# Patient Record
Sex: Male | Born: 1975 | Race: White | Hispanic: Yes | Marital: Married | Smoking: Never smoker
Health system: Southern US, Community
[De-identification: ages and names within clinical notes are randomized; demographics above are authoritative.]

## PROBLEM LIST (undated history)

## (undated) DIAGNOSIS — J189 Pneumonia, unspecified organism: Secondary | ICD-10-CM

## (undated) DIAGNOSIS — U071 COVID-19: Secondary | ICD-10-CM

## (undated) DIAGNOSIS — Z87442 Personal history of urinary calculi: Secondary | ICD-10-CM

## (undated) DIAGNOSIS — R918 Other nonspecific abnormal finding of lung field: Secondary | ICD-10-CM

## (undated) DIAGNOSIS — R0600 Dyspnea, unspecified: Secondary | ICD-10-CM

## (undated) HISTORY — PX: WISDOM TOOTH EXTRACTION: SHX21

## (undated) HISTORY — PX: KIDNEY STONE SURGERY: SHX686

## (undated) HISTORY — PX: BRAIN SURGERY: SHX531

## (undated) HISTORY — PX: TONSILLECTOMY: SHX5217

---

## 2013-08-01 HISTORY — PX: BRAIN SURGERY: SHX531

## 2020-03-26 ENCOUNTER — Ambulatory Visit
Admission: RE | Admit: 2020-03-26 | Discharge: 2020-03-26 | Disposition: A | Discharge status: Home / Self Care | Payer: No Typology Code available for payment source | Source: Ambulatory Visit | Attending: Nurse Practitioner | Admitting: Nurse Practitioner

## 2020-03-26 ENCOUNTER — Other Ambulatory Visit: Payer: Self-pay

## 2020-03-26 ENCOUNTER — Other Ambulatory Visit: Payer: Self-pay | Admitting: Nurse Practitioner

## 2020-03-26 DIAGNOSIS — R0989 Other specified symptoms and signs involving the circulatory and respiratory systems: Secondary | ICD-10-CM

## 2020-03-26 IMAGING — CR DG CHEST 2V
2 series · 2 of 2 positions shown · non-contrast
Comparison: None.

CLINICAL DATA: Chest congestion

EXAM:
CHEST - 2 VIEW

[w chest pa]
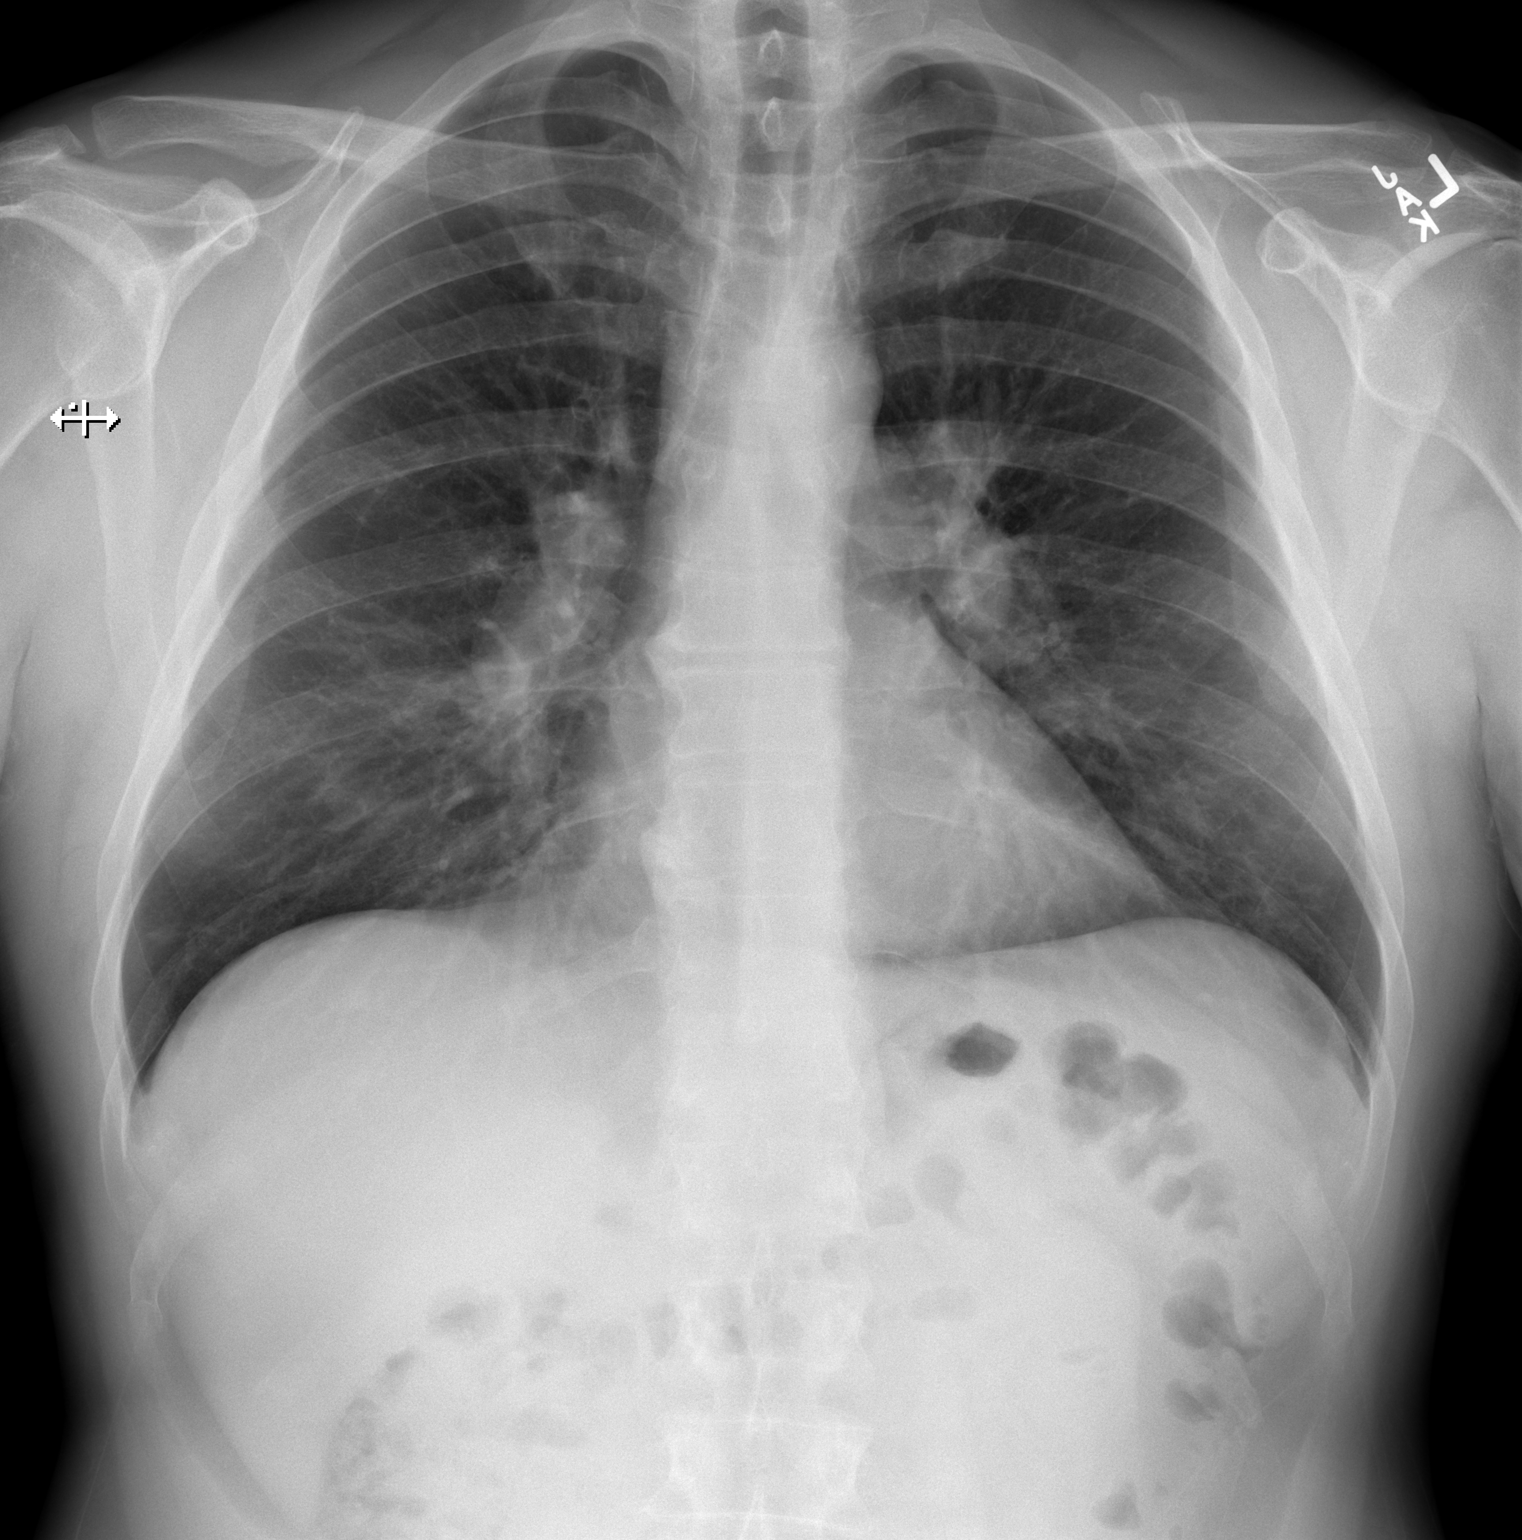

[w chest lat]
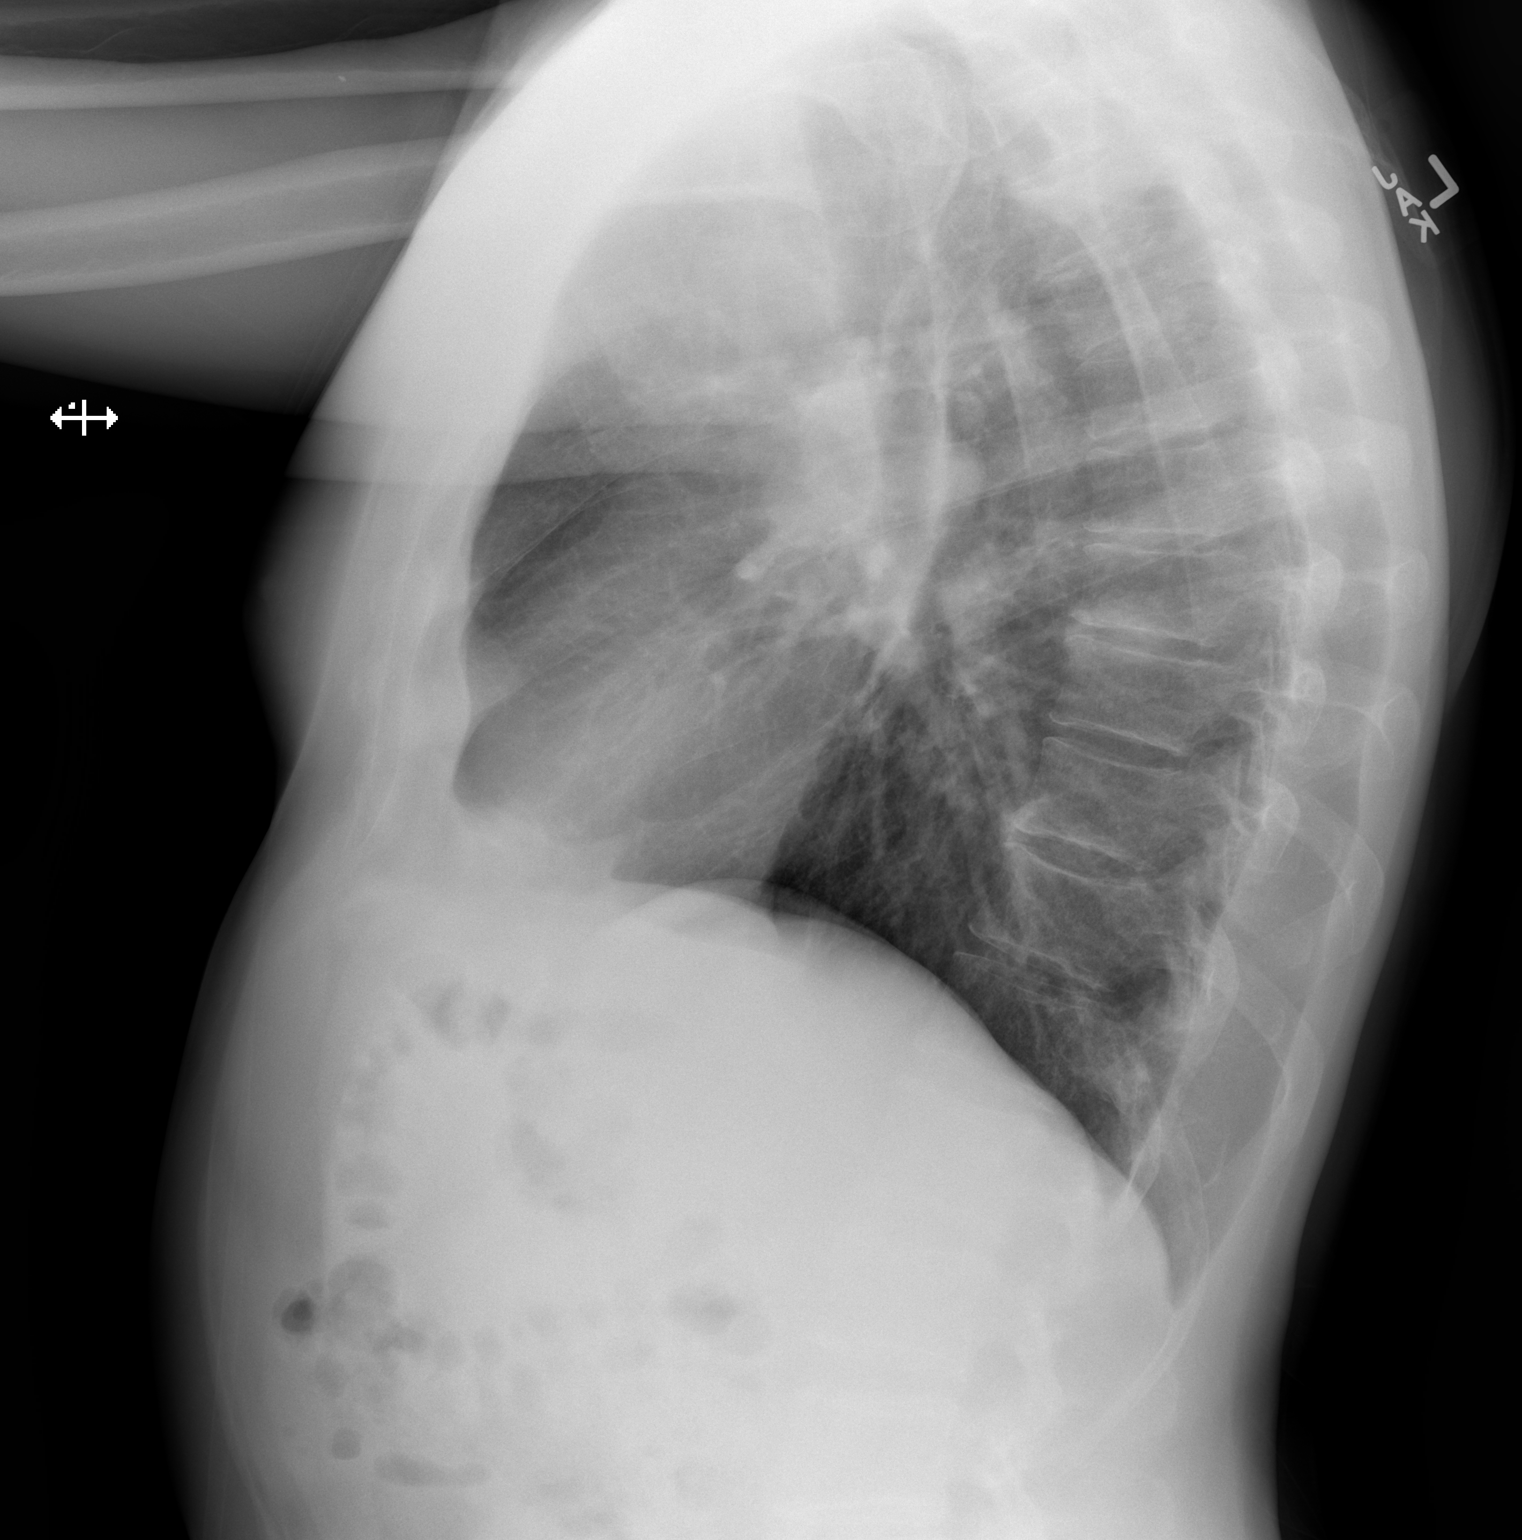

[2 of 2 positions shown; findings below may reference images not displayed]

FINDINGS: Cardiac shadow is within normal limits. The lungs are well aerated
bilaterally. Fullness is noted in the hila bilaterally suspicious
for underlying adenopathy. No acute bony abnormality is seen.
IMPRESSION: Prominent hila bilaterally likely related underlying adenopathy. CT
of the chest with contrast is recommended for further evaluation.

## 2020-03-30 ENCOUNTER — Other Ambulatory Visit: Payer: Self-pay | Admitting: Nurse Practitioner

## 2020-03-30 DIAGNOSIS — R591 Generalized enlarged lymph nodes: Secondary | ICD-10-CM

## 2020-03-30 DIAGNOSIS — R079 Chest pain, unspecified: Secondary | ICD-10-CM

## 2020-04-10 ENCOUNTER — Ambulatory Visit
Admission: RE | Admit: 2020-04-10 | Discharge: 2020-04-10 | Disposition: A | Discharge status: Home / Self Care | Payer: Self-pay | Source: Ambulatory Visit | Attending: Nurse Practitioner | Admitting: Nurse Practitioner

## 2020-04-10 ENCOUNTER — Other Ambulatory Visit: Payer: Self-pay

## 2020-04-10 DIAGNOSIS — R591 Generalized enlarged lymph nodes: Secondary | ICD-10-CM

## 2020-04-10 IMAGING — CT CT CHEST W/ CM
1 series · 15 of 34 positions shown, 19 images · IV contrast (APPLIED)
Comparison: Chest radiograph [DATE]

CLINICAL DATA: Follow-up abnormal chest x-ray

EXAM:
CT CHEST WITH CONTRAST
TECHNIQUE: Multidetector CT imaging of the chest was performed during
intravenous contrast administration.
CONTRAST:  75mL [TZ] IOPAMIDOL ([TZ]) INJECTION 61%

[Series 2: chest w/cm · axial · 0.83mm/px · z∈[-151,+125]mm · 15 of 162 slices shown, 19 images]
[im 12/162  mediastinal]
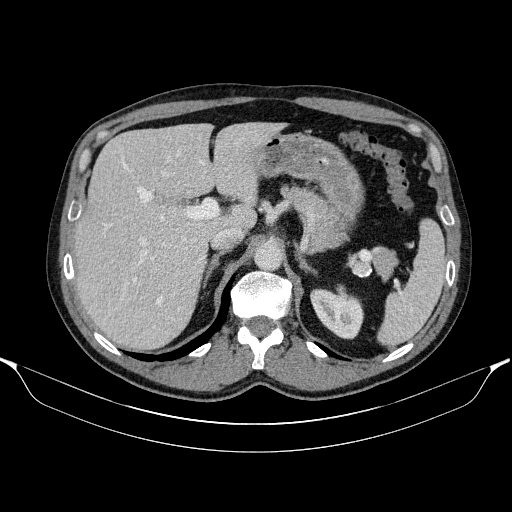
[im 12/162  lung]
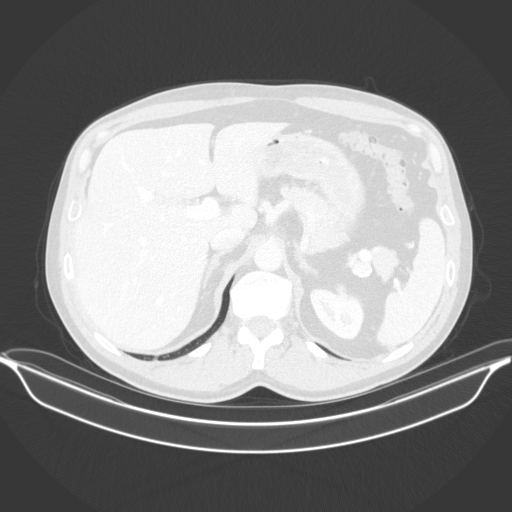
[im 24/162  lung]
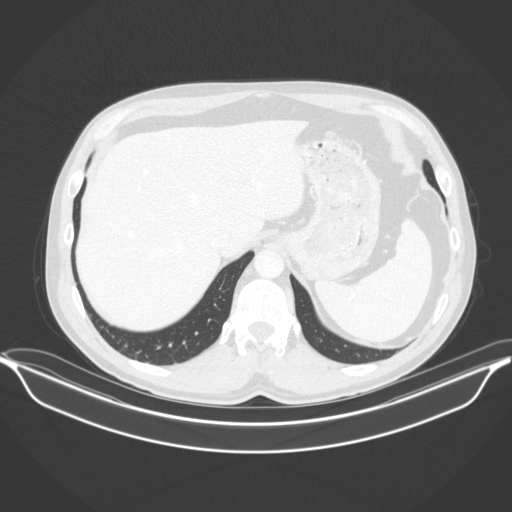
[im 33/162  lung]
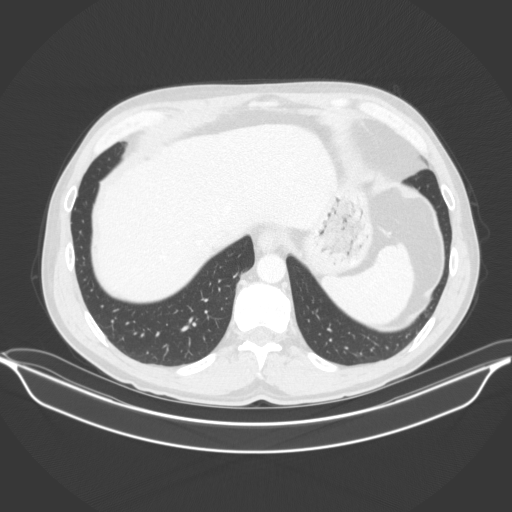
[im 42/162  lung]
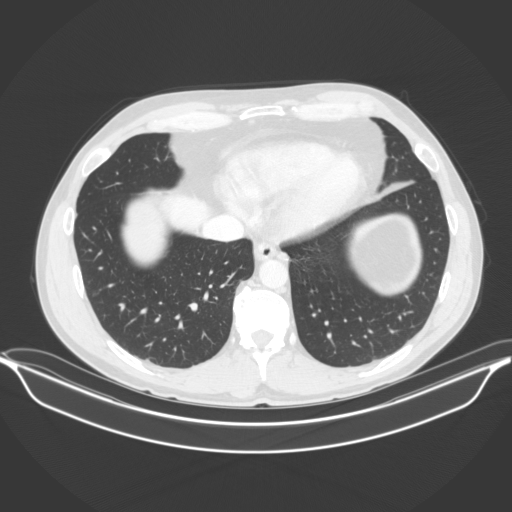
[im 54/162  mediastinal]
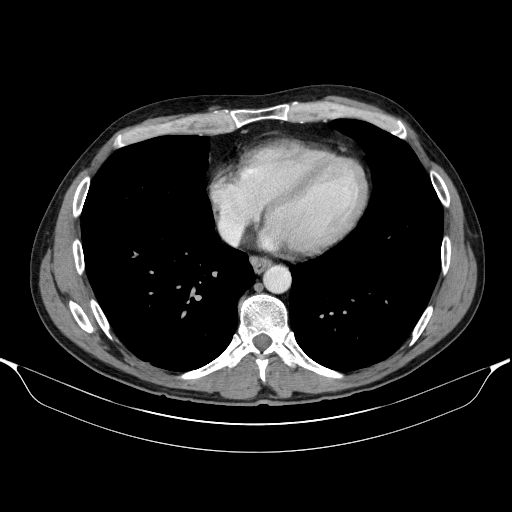
[im 54/162  lung]
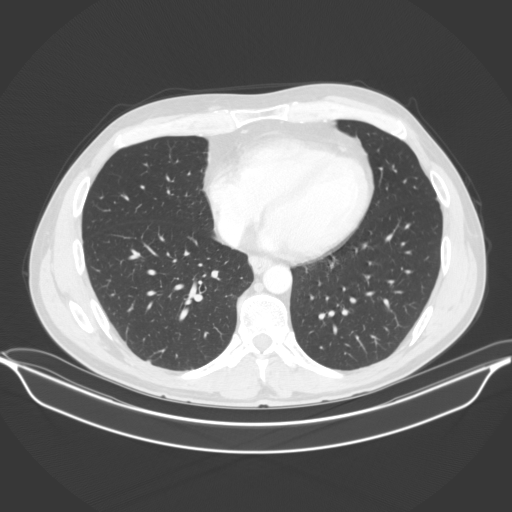
[im 65/162  lung]
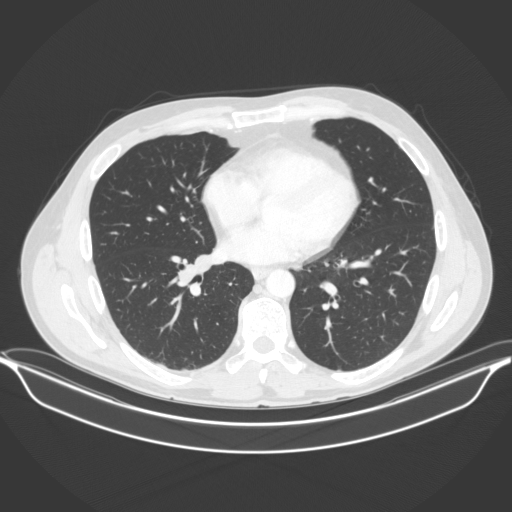
[im 72/162  lung]
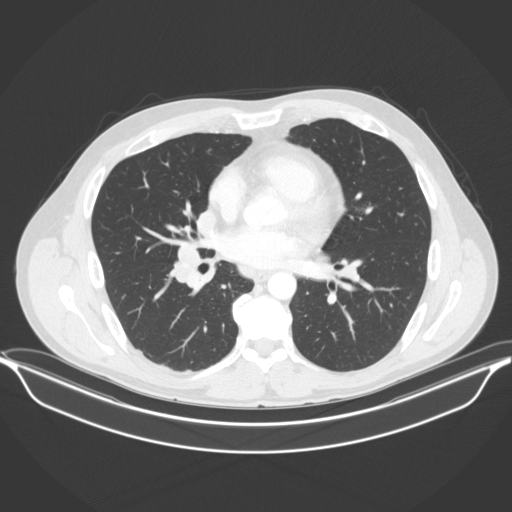
[im 84/162  lung]
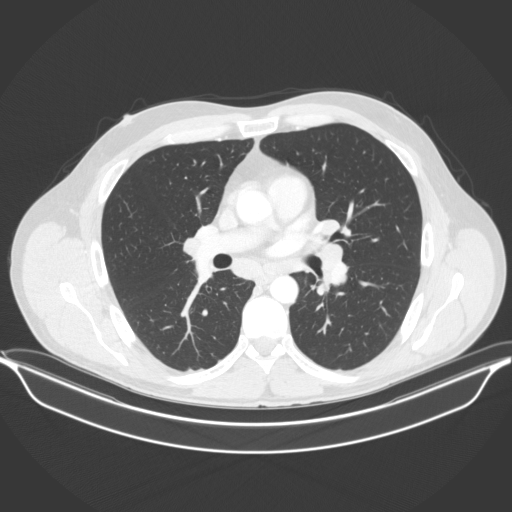
[im 90/162  mediastinal]
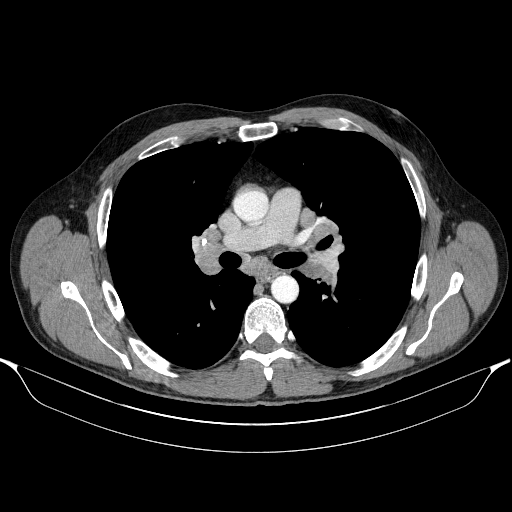
[im 90/162  lung]
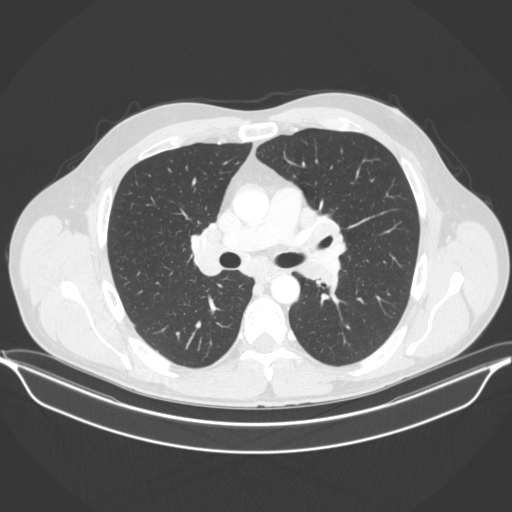
[im 97/162  lung]
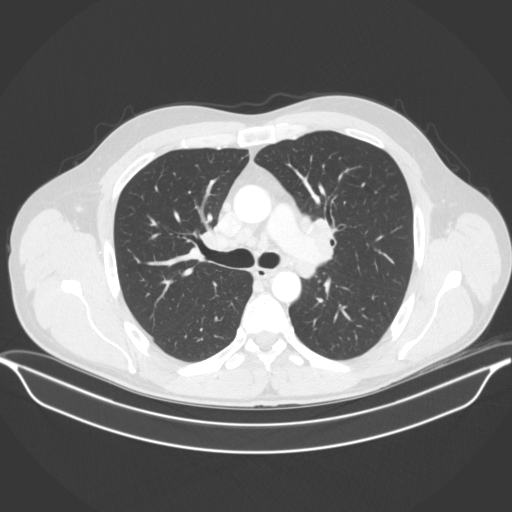
[im 108/162  lung]
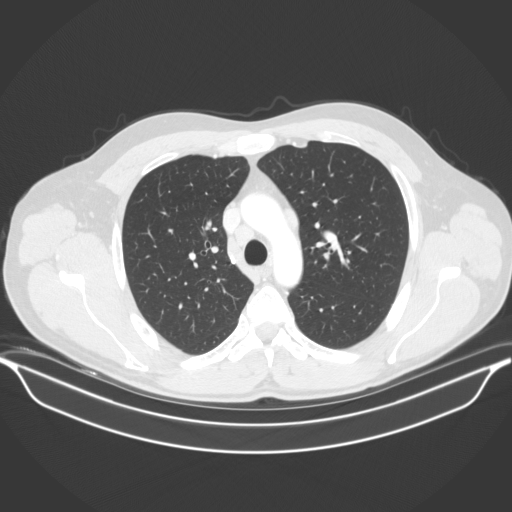
[im 120/162  lung]
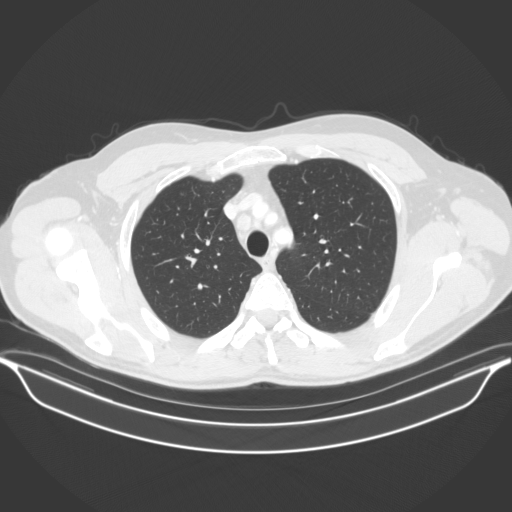
[im 129/162  mediastinal]
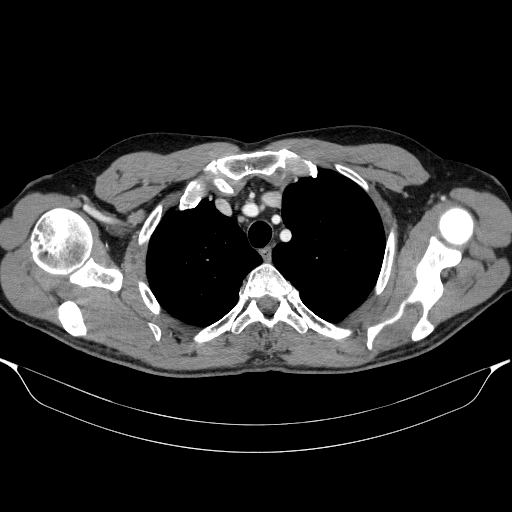
[im 129/162  lung]
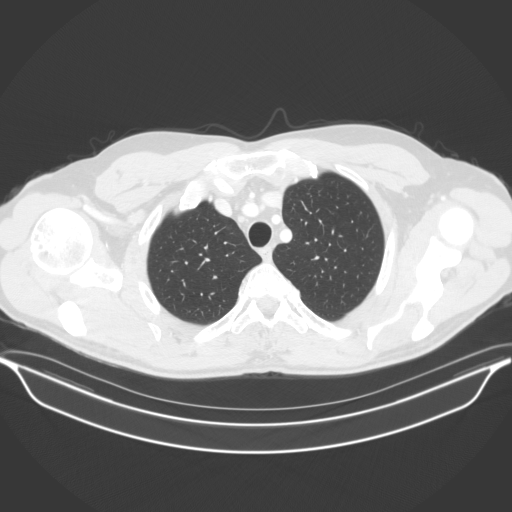
[im 138/162  lung]
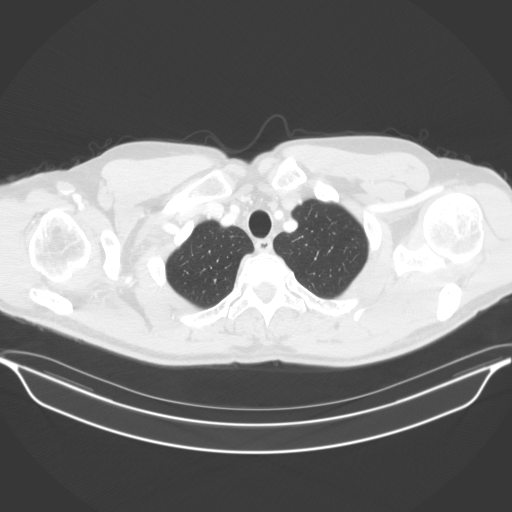
[im 150/162  lung]
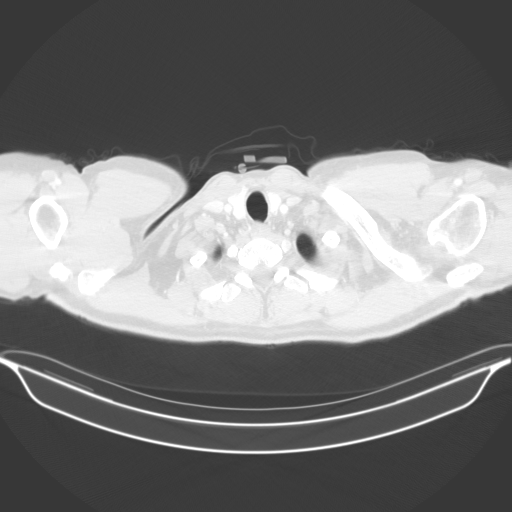

[15 of 34 positions shown; findings below may reference images not displayed]

FINDINGS: Cardiovascular: Normal heart size. No pericardial effusion. Thoracic
aorta is normal in caliber.

Mediastinum/Nodes: Mediastinal and hilar adenopathy is present. For
example, right subcarinal node measuring 1.6 x 2.4 cm (series 2,
image 78); right hilar node measuring 3.6 x 2.2 cm (image 85);
pretracheal node measuring 1.4 x 1.2 cm (image 46). Few small foci
of right hilar calcification. Thyroid is unremarkable. Esophagus is
unremarkable.

Lungs/Pleura: No consolidation or mass. There is a 3 mm nodule of
the left lower lobe (series 5, image 84). Right lower lobe nodule
measuring 4 mm (image 120)

Upper Abdomen: No acute abnormality.

Musculoskeletal: Focus of sclerosis within the L1 vertebral body
probably reflects a bone island. No acute osseous abnormality.
IMPRESSION: Mediastinal and bilateral hilar lymphadenopathy.

Right lower lobe 4 mm nodule. Left lower lobe 3 mm nodule. No
follow-up needed if patient is low-risk (and has no known or
suspected primary neoplasm). Non-contrast chest CT can be considered
in 12 months depending on the etiology of above.

## 2020-04-10 MED ORDER — IOPAMIDOL (ISOVUE-300) INJECTION 61%
75.0000 mL | Freq: Once | INTRAVENOUS | Status: AC | PRN
  Administered 2020-04-10: 75 mL via INTRAVENOUS

## 2020-04-21 ENCOUNTER — Encounter (HOSPITAL_COMMUNITY): Payer: Self-pay | Admitting: *Deleted

## 2020-04-21 ENCOUNTER — Emergency Department (HOSPITAL_COMMUNITY)
Admission: EM | Admit: 2020-04-21 | Discharge: 2020-04-22 | Discharge status: Against Medical Advice | Payer: Self-pay | Attending: Emergency Medicine | Admitting: Emergency Medicine

## 2020-04-21 ENCOUNTER — Other Ambulatory Visit: Payer: Self-pay

## 2020-04-21 DIAGNOSIS — R079 Chest pain, unspecified: Secondary | ICD-10-CM | POA: Insufficient documentation

## 2020-04-21 DIAGNOSIS — R21 Rash and other nonspecific skin eruption: Secondary | ICD-10-CM | POA: Insufficient documentation

## 2020-04-21 DIAGNOSIS — R000 Tachycardia, unspecified: Secondary | ICD-10-CM | POA: Insufficient documentation

## 2020-04-21 DIAGNOSIS — J069 Acute upper respiratory infection, unspecified: Secondary | ICD-10-CM | POA: Insufficient documentation

## 2020-04-21 DIAGNOSIS — R102 Pelvic and perineal pain: Secondary | ICD-10-CM | POA: Insufficient documentation

## 2020-04-21 DIAGNOSIS — R509 Fever, unspecified: Secondary | ICD-10-CM

## 2020-04-21 DIAGNOSIS — R911 Solitary pulmonary nodule: Secondary | ICD-10-CM | POA: Insufficient documentation

## 2020-04-21 DIAGNOSIS — R519 Headache, unspecified: Secondary | ICD-10-CM | POA: Insufficient documentation

## 2020-04-21 DIAGNOSIS — R590 Localized enlarged lymph nodes: Secondary | ICD-10-CM | POA: Insufficient documentation

## 2020-04-21 HISTORY — DX: COVID-19: U07.1

## 2020-04-21 LAB — CBC WITH DIFFERENTIAL/PLATELET
Abs Immature Granulocytes: 0.04 10*3/uL (ref 0.00–0.07)
Basophils Absolute: 0.1 10*3/uL (ref 0.0–0.1)
Basophils Relative: 1 %
Eosinophils Absolute: 0.1 10*3/uL (ref 0.0–0.5)
Eosinophils Relative: 2 %
HCT: 44.2 % (ref 39.0–52.0)
Hemoglobin: 14.3 g/dL (ref 13.0–17.0)
Immature Granulocytes: 1 %
Lymphocytes Relative: 9 %
Lymphs Abs: 0.7 10*3/uL (ref 0.7–4.0)
MCH: 30 pg (ref 26.0–34.0)
MCHC: 32.4 g/dL (ref 30.0–36.0)
MCV: 92.7 fL (ref 80.0–100.0)
Monocytes Absolute: 0.8 10*3/uL (ref 0.1–1.0)
Monocytes Relative: 10 %
Neutro Abs: 6.2 10*3/uL (ref 1.7–7.7)
Neutrophils Relative %: 77 %
Platelets: 196 10*3/uL (ref 150–400)
RBC: 4.77 MIL/uL (ref 4.22–5.81)
RDW: 12.3 % (ref 11.5–15.5)
WBC: 8 10*3/uL (ref 4.0–10.5)
nRBC: 0 % (ref 0.0–0.2)

## 2020-04-21 LAB — COMPREHENSIVE METABOLIC PANEL
ALT: 27 U/L (ref 0–44)
AST: 17 U/L (ref 15–41)
Albumin: 3.7 g/dL (ref 3.5–5.0)
Alkaline Phosphatase: 36 U/L — ABNORMAL LOW (ref 38–126)
Anion gap: 10 (ref 5–15)
BUN: 11 mg/dL (ref 6–20)
CO2: 25 mmol/L (ref 22–32)
Calcium: 9 mg/dL (ref 8.9–10.3)
Chloride: 99 mmol/L (ref 98–111)
Creatinine, Ser: 0.83 mg/dL (ref 0.61–1.24)
GFR calc Af Amer: >60 mL/min (ref >60)
GFR calc non Af Amer: >60 mL/min (ref >60)
Glucose, Bld: 109 mg/dL — ABNORMAL HIGH (ref 70–99)
Potassium: 4.1 mmol/L (ref 3.5–5.1)
Sodium: 134 mmol/L — ABNORMAL LOW (ref 135–145)
Total Bilirubin: 0.9 mg/dL (ref 0.3–1.2)
Total Protein: 6.8 g/dL (ref 6.5–8.1)

## 2020-04-21 LAB — LACTIC ACID, PLASMA: Lactic Acid, Venous: 1.4 mmol/L (ref 0.5–1.9)

## 2020-04-21 NOTE — ED Notes (Signed)
Pt stated they are waiting outside

## 2020-04-21 NOTE — ED Notes (Signed)
Pt still here

## 2020-04-21 NOTE — ED Triage Notes (Addendum)
Pt was referred here from MD office for fever 25 days and had positive covid test. Developed sob and continues to have fever. Referred to hospital due to abnormal chest CT done on 04/10/20. Pt reports headaches too. Used interpreter line

## 2020-04-21 NOTE — ED Notes (Signed)
Pt name called for updated vitals, no response 

## 2020-04-22 ENCOUNTER — Emergency Department (HOSPITAL_COMMUNITY): Payer: Self-pay

## 2020-04-22 IMAGING — DX DG CHEST 1V PORT
1 series · 1 of 1 positions shown · non-contrast
Comparison: [DATE], CT [DATE]

CLINICAL DATA: Fever, dyspnea

EXAM:
PORTABLE CHEST 1 VIEW

[chest ap]
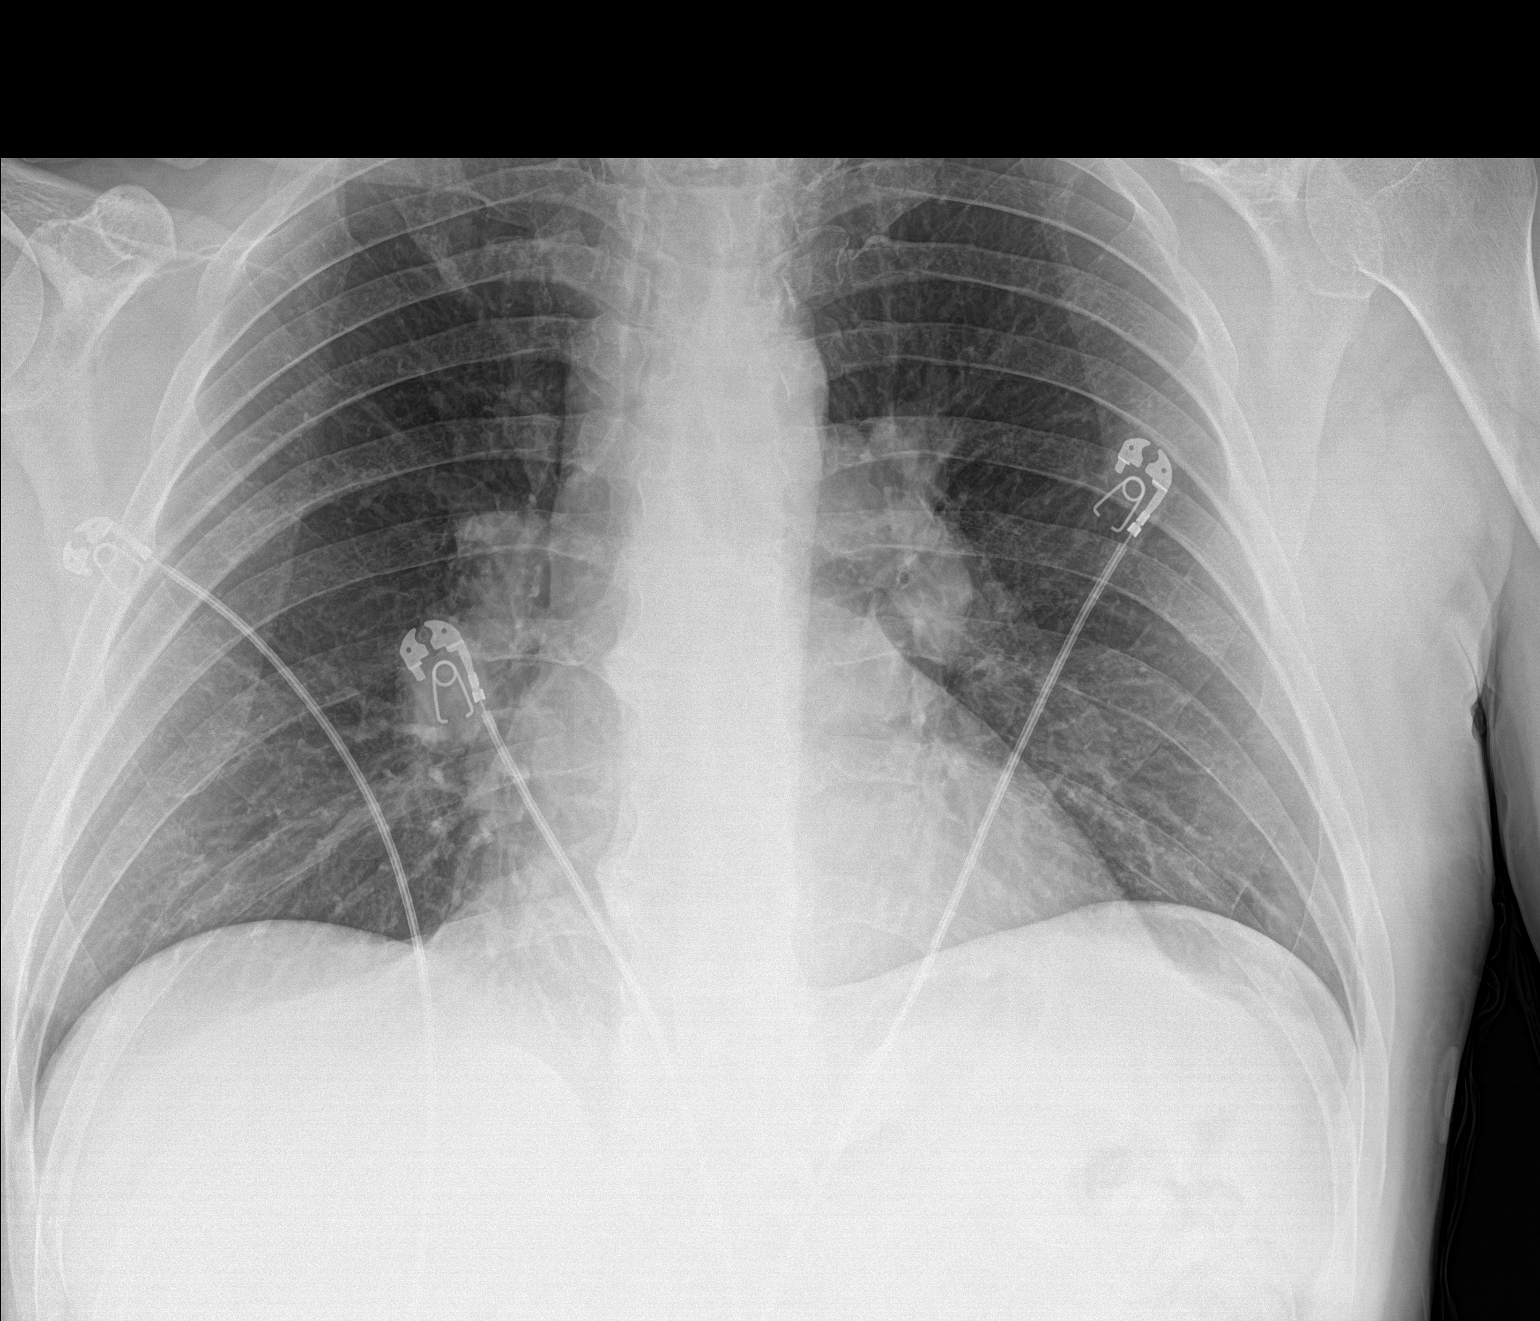

[1 of 1 positions shown; findings below may reference images not displayed]

FINDINGS: Lungs are clear. No pneumothorax or pleural effusion. Cardiac size
is within normal limits. Bilateral hilar fullness related to
underlying hilar adenopathy is again seen and is unchanged.
Pulmonary vascularity is normal. No acute bone abnormality.
IMPRESSION: Stable hilar adenopathy. No radiographic evidence of acute
cardiopulmonary disease.

## 2020-04-22 MED ORDER — PROMETHAZINE-DM 6.25-15 MG/5ML PO SYRP: 5.0000 mL | ORAL_SOLUTION | Freq: Four times a day (QID) | ORAL | 0 refills | Status: DC | PRN

## 2020-04-22 NOTE — ED Provider Notes (Signed)
[facility] EMERGENCY DEPARTMENT Provider Note   CSN: [id number] Arrival date & time: 04/21/20  1226     History Chief Complaint  Patient presents with  . Fever    Aaron Chapman is a 44 y.o. male who presents to the emergency department with a chief complaint of fever.  The patient has been having intermittent fevers for the last 37 days.  Reports that he was diagnosed with COVID-19 on August 14 and was persistently febrile for approximately 15 days.  At that time, he was also having nonproductive cough and shortness of breath that resolved when he defervesced.  He was then afebrile until around 8 days ago when his fevers returned accompanied by severe headaches, shortness of breath, nonproductive cough, and mild, dull, intermittent chest pain.  T-max 103.1-104 (39.5- 40 C) over the last week.  He has been taking antipyretics every 6 hours.  Last dose of 800 mg of ibuprofen was at 20:00.  He presented to the ER today after he developed a diffuse rash to his trunk and upper and lower extremities.  The rash is not pruritic.  He initially noticed the rash on his bilateral flanks that then spread across his abdomen, chest, back, arms, and legs.  No known sick contacts with a similar rash.  He has had a 4 kg weight loss since he was diagnosed with COVID-19, but attributes this to poor appetite.  Aside from when he has been febrile, he has had no other night sweats.  No hemoptysis, vomiting, neck pain or stiffness, confusion, otalgia, or sore throat.  He also reports that he was previously told that he had inflammation of the colon.  He does not ever recall being diagnosed with diverticulitis.  He does report that he has had some inflammation in his left pelvic region recently.  Denies diarrhea, constipation, arthralgias, melena, hematochezia, dysuria, hematuria, flank pain, penile or testicular pain or swelling.  He has been followed by his PCP for his symptoms.  Reports that he  was prescribed a course of amoxicillin after he developed a pneumonia after he was diagnosed with COVID-19.  He completed the course of antibiotics yesterday.  Reports that he also received approximately 6 "steroid shots" to help with inflammation since his diagnosis.  Given persistent fevers, his PCP ordered a CT scan of the chest on 9/10, which demonstrated right lower lobe 4 mm nodule, left lower lobe 3 mm nodule and mediastinal and bilateral hilar lymphadenopathy.  There is also a focus of sclerosis on the L1 vertebral body it was likely reflecting a bone island.  He moved to the [place] from [place] in January 2021.  Reports that he received all of his childhood immunizations.  He was also fully vaccinated against COVID-19 after receiving the [employer] vaccine approximately 4 months ago.  He has a history of Zika, dengue, and [place].   The history is provided by the patient and medical records. A language interpreter was used ([place]).       Past Medical History:  Diagnosis Date  . COVID-19     There are no problems to display for this patient.   History reviewed. No pertinent surgical history.     No family history on file.  Social History   Tobacco Use  . Smoking status: Never Smoker  . Smokeless tobacco: Never Used  Substance Use Topics  . Alcohol use: Not Currently  . Drug use: Not Currently    Home Medications Prior to Admission  medications   Medication Sig Start Date End Date Taking? Authorizing Provider  promethazine-dextromethorphan (PROMETHAZINE-DM) 6.25-15 MG/5ML syrup Take 5 mLs by mouth 4 (four) times daily as needed for cough. 04/22/20   Carter Kassel A, PA-C    Allergies    Aspirin  Review of Systems   Review of Systems  Constitutional: Positive for chills, diaphoresis and fever. Negative for appetite change.  HENT: Negative for congestion and sore throat.   Eyes: Negative for visual disturbance.  Respiratory: Positive for cough  and shortness of breath. Negative for wheezing.   Cardiovascular: Positive for chest pain. Negative for palpitations and leg swelling.  Gastrointestinal: Positive for abdominal pain. Negative for blood in stool, constipation, diarrhea, nausea and vomiting.  Genitourinary: Negative for dysuria, flank pain, hematuria, penile pain, penile swelling, scrotal swelling and urgency.  Musculoskeletal: Negative for arthralgias, back pain, joint swelling, neck pain and neck stiffness.  Skin: Negative for rash.  Allergic/Immunologic: Negative for immunocompromised state.  Neurological: Positive for headaches. Negative for dizziness, seizures, syncope, weakness and numbness.  Psychiatric/Behavioral: Negative for confusion.    Physical Exam Updated Vital Signs BP (!) 130/92 (BP Location: Right Arm)   Pulse 90   Temp 99 F (37.2 C) (Oral)   Resp 19   Wt 79.8 kg   SpO2 100%   Physical Exam Vitals and nursing note reviewed.  Constitutional:      General: He is not in acute distress.    Appearance: He is well-developed. He is not ill-appearing, toxic-appearing or diaphoretic.     Comments: Nontoxic.  No acute distress.  HENT:     Head: Normocephalic.     Mouth/Throat:     Mouth: Mucous membranes are moist.     Comments: No intraoral lesions Eyes:     General: No scleral icterus.    Extraocular Movements: Extraocular movements intact.     Conjunctiva/sclera: Conjunctivae normal.     Pupils: Pupils are equal, round, and reactive to light.  Neck:     Comments: No meningismus Cardiovascular:     Rate and Rhythm: Regular rhythm. Tachycardia present.     Heart sounds: No murmur heard.   Pulmonary:     Effort: Pulmonary effort is normal. No respiratory distress.     Breath sounds: No wheezing or rales.  Abdominal:     General: There is no distension.     Palpations: Abdomen is soft. There is no mass.     Tenderness: There is abdominal tenderness. There is no right CVA tenderness, left CVA  tenderness, guarding or rebound.     Hernia: No hernia is present.     Comments: Mild tenderness palpation of the left pelvic region.  No palpable inguinal lymphadenopathy bilaterally.  Abdomen is soft and nondistended.  Musculoskeletal:     Cervical back: Normal range of motion and neck supple.     Right lower leg: No edema.     Left lower leg: No edema.  Skin:    General: Skin is warm and dry.     Comments: Diffuse macular papular rash noted to the trunk, arms, and legs.  The face, and palms and soles are spared.  Neurological:     Mental Status: He is alert.  Psychiatric:        Behavior: Behavior normal.     ED Results / Procedures / Treatments   Labs (all labs ordered are listed, but only abnormal results are displayed) Labs Reviewed  COMPREHENSIVE METABOLIC PANEL - Abnormal; Notable for the following components:  Result Value   Sodium 134 (*)    Glucose, Bld 109 (*)    Alkaline Phosphatase 36 (*)    All other components within normal limits  CULTURE, BLOOD (ROUTINE X 2)  CULTURE, BLOOD (ROUTINE X 2)  LACTIC ACID, PLASMA  CBC WITH DIFFERENTIAL/PLATELET  URINALYSIS, ROUTINE W REFLEX MICROSCOPIC  QUANTIFERON-TB GOLD PLUS  PLASMODIUM SP. PCR    EKG EKG Interpretation  Date/Time:  Tuesday April 21 2020 13:57:00 EDT Ventricular Rate:  105 PR Interval:  148 QRS Duration: 80 QT Interval:  308 QTC Calculation: 407 R Axis:   98 Text Interpretation: Sinus tachycardia Rightward axis Borderline ECG No old tracing to compare Confirmed by Dione Booze ([id number]) on 04/21/2020 11:45:56 PM   Radiology DG Chest Portable 1 View  Result Date: 04/22/2020 CLINICAL DATA:  Fever, dyspnea EXAM: PORTABLE CHEST 1 VIEW COMPARISON:  03/26/2020, CT 04/10/2020 FINDINGS: Lungs are clear. No pneumothorax or pleural effusion. Cardiac size is within normal limits. Bilateral hilar fullness related to underlying hilar adenopathy is again seen and is unchanged. Pulmonary vascularity is  normal. No acute bone abnormality. IMPRESSION: Stable hilar adenopathy. No radiographic evidence of acute cardiopulmonary disease. Electronically Signed   By: Helyn Numbers MD   On: 04/22/2020 00:21    Procedures Procedures (including critical care time)  Medications Ordered in ED Medications - No data to display  ED Course  I have reviewed the triage vital signs and the nursing notes.  Pertinent labs & imaging results that were available during my care of the patient were reviewed by me and considered in my medical decision making (see chart for details).    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 vaccine presenting with intermittent fevers for the last 37 days.  Fevers initially began when he was diagnosed with COVID-19 and resolved after 15 days, but returned approximately 8 days ago accompanied by shortness of breath, cough, dull chest pain, headache.  Most recently, he developed a diffuse rash to the trunk and extremities around 3 days ago.  Patient's previous imaging from September 10 reviewed with CT chest demonstrating bilateral pulmonary nodules and lower lobes as well as mediastinal and bilateral hilar adenopathy.  Repeat chest x-ray has been reviewed by me demonstrating stable hilar adenopathy.  Chest x-ray is otherwise unremarkable.  Vital signs have been stable and reassuring since arriving in the ER.  Afebrile, but notes that his last dose of ibuprofen was at 2000.  The patient was seen and independently evaluated by Dr. Preston Fleeting, attending physician.   CBC is reassuring with no leukocytosis.  No metabolic derangements.  Lactate is normal.  Given concern for fever of unknown origin given that the patient lived in [place] until January 2021, will order QuantiFERON gold, blood cultures x2, and Plasmodium  PCR.  Could also consider that symptoms could be secondary to long Covid; however, I am uncertain about the recurrence of his fever over the last week since he had previously defervesced.  He does not appear to have a bacterial process at this time requiring antibiotics.  Will refer the patient to outpatient infectious disease clinic and send message to on-call infectious disease physician via epic.  We will also ensure that patient has a referral to pulmonology.  He is already established with primary care.  At this time, patient is hemodynamically stable and in no acute distress.  The patient's work-up and plan were discussed using a certified Spanish medical interpreter.  All questions were answered.  ER return precautions given.  Will discharge home with symptomatic care for cough and fever.  Safe for discharge to home with outpatient follow-up as indicated.  Final Clinical Impression(s) / ED Diagnoses Final diagnoses:  Fever of unknown origin  Upper respiratory tract infection, unspecified type  Adenopathy, hilar    Rx / DC Orders ED Discharge Orders         Ordered    promethazine-dextromethorphan (PROMETHAZINE-DM) 6.25-15 MG/5ML syrup  4 times daily PRN        04/22/20 0222           Keithon Mccoin A, PA-C 04/22/20 0747    Dione Booze, MD 04/22/20 2240

## 2020-04-22 NOTE — Discharge Instructions (Addendum)
Thank you for allowing me to care for you today in the Emergency Department.   I have given you a referral to an infectious disease clinic as well as a pulmonology clinic (lung specialist).  Please call both offices to schedule a follow-up appointment.  I have sent a message to the infectious disease clinic to let them know that you will be calling their office since you have been having intermittent fevers for more than a month.  Although you have not been feeling well, you were blood work and imaging in the ER today were reassuring enough to send you home to have a follow-up in the clinic.  Take 650 mg of Tylenol or 600 mg of ibuprofen with food every 6 hours for fever.  You can alternate between these 2 medications every 3 hours if your pain returns.  For instance, you can take Tylenol at noon, followed by a dose of ibuprofen at 3, followed by second dose of Tylenol and 6.  Take 5 mL of Promethazine DM every 6 hours as needed for cough.  If your symptoms were to significantly change and you developed respiratory distress, significant trouble breathing, if you passed out, develop uncontrollable vomiting, or other new, concerning symptoms, you should return to the emergency department for reevaluation.  Gracias por permitirme atenderlo [employer].  Conley Rolls he remitido a Agricultural engineer de enfermedades infecciosas, as como a Agricultural engineer de neumologa (especialista en pulmones). Llame a ambas oficinas para programar una cita de seguimiento. He enviado un mensaje a la clnica de enfermedades infecciosas para informarles que llamar a su oficina ya que ha tenido fiebres intermitentes durante ms de Materials engineer.  Aunque no se ha sentido [address], los [employer] de sangre y las imgenes en la sala de emergencias hoy fueron lo suficientemente tranquilizadores como para enviarlo a casa para un seguimiento en la clnica.  Tome 650 mg de Tylenol o 600 mg de ibuprofeno con alimentos cada 6 horas para  la fiebre. Puede alternar entre estos 2 medicamentos cada 3 horas si su dolor regresa. Por ejemplo, puede tomar Tylenol al medioda, seguido de una dosis de ibuprofeno a las 3, seguida de Neomia Dear segunda dosis de Tylenol y 6.  Tome 5 ml de Promethazine DM cada 6 horas segn sea necesario para la tos.  Si sus sntomas cambiaran significativamente y desarroll dificultad respiratoria, dificultad significativa para respirar, si se desmay, desarroll vmitos incontrolables u otros sntomas nuevos relacionados, debe regresar al departamento de emergencias para una reevaluacin.

## 2020-04-23 ENCOUNTER — Telehealth: Payer: Self-pay | Admitting: Pulmonary Disease

## 2020-04-23 NOTE — Telephone Encounter (Signed)
Spoke with the pt's friend Elita Quick  He states pt tested pos for covid 4 wks ago  He has been having fever ever since that will not go away  Appt scheduled with Dr. Isaiah Serge for 05/06/20  I checked and there are no sooner appts with any provider  Advised if having acute symptoms and needs to be seen sooner go to UC  He verbalized understanding

## 2020-04-24 LAB — QUANTIFERON-TB GOLD PLUS

## 2020-04-24 LAB — PLASMODIUM SP. PCR: Plasmodium Sp. PCR: NEGATIVE

## 2020-04-25 ENCOUNTER — Emergency Department (HOSPITAL_COMMUNITY): Payer: Self-pay

## 2020-04-25 ENCOUNTER — Other Ambulatory Visit: Payer: Self-pay

## 2020-04-25 ENCOUNTER — Encounter (HOSPITAL_COMMUNITY): Payer: Self-pay | Admitting: Emergency Medicine

## 2020-04-25 ENCOUNTER — Inpatient Hospital Stay (HOSPITAL_COMMUNITY)
Admission: EM | Admit: 2020-04-25 | Discharge: 2020-05-05 | DRG: 196 | Disposition: A | Discharge status: Home / Self Care | Payer: Self-pay | Attending: Internal Medicine | Admitting: Internal Medicine

## 2020-04-25 DIAGNOSIS — G039 Meningitis, unspecified: Secondary | ICD-10-CM | POA: Diagnosis present

## 2020-04-25 DIAGNOSIS — I776 Arteritis, unspecified: Secondary | ICD-10-CM | POA: Diagnosis present

## 2020-04-25 DIAGNOSIS — G0490 Encephalitis and encephalomyelitis, unspecified: Secondary | ICD-10-CM | POA: Diagnosis present

## 2020-04-25 DIAGNOSIS — R519 Headache, unspecified: Secondary | ICD-10-CM | POA: Diagnosis present

## 2020-04-25 DIAGNOSIS — R479 Unspecified speech disturbances: Secondary | ICD-10-CM

## 2020-04-25 DIAGNOSIS — D8689 Sarcoidosis of other sites: Principal | ICD-10-CM | POA: Diagnosis present

## 2020-04-25 DIAGNOSIS — Z79899 Other long term (current) drug therapy: Secondary | ICD-10-CM

## 2020-04-25 DIAGNOSIS — R599 Enlarged lymph nodes, unspecified: Secondary | ICD-10-CM

## 2020-04-25 DIAGNOSIS — I728 Aneurysm of other specified arteries: Secondary | ICD-10-CM | POA: Diagnosis present

## 2020-04-25 DIAGNOSIS — R29818 Other symptoms and signs involving the nervous system: Secondary | ICD-10-CM

## 2020-04-25 DIAGNOSIS — Z7952 Long term (current) use of systemic steroids: Secondary | ICD-10-CM

## 2020-04-25 DIAGNOSIS — Z8619 Personal history of other infectious and parasitic diseases: Secondary | ICD-10-CM

## 2020-04-25 DIAGNOSIS — Z8616 Personal history of COVID-19: Secondary | ICD-10-CM

## 2020-04-25 DIAGNOSIS — I729 Aneurysm of unspecified site: Secondary | ICD-10-CM

## 2020-04-25 DIAGNOSIS — R509 Fever, unspecified: Secondary | ICD-10-CM | POA: Diagnosis present

## 2020-04-25 DIAGNOSIS — Z87442 Personal history of urinary calculi: Secondary | ICD-10-CM

## 2020-04-25 HISTORY — DX: Pneumonia, unspecified organism: J18.9

## 2020-04-25 HISTORY — DX: Personal history of urinary calculi: Z87.442

## 2020-04-25 HISTORY — DX: Dyspnea, unspecified: R06.00

## 2020-04-25 HISTORY — DX: Other nonspecific abnormal finding of lung field: R91.8

## 2020-04-25 LAB — CSF CELL COUNT WITH DIFFERENTIAL
Eosinophils, CSF: 0 % (ref 0–1)
Lymphs, CSF: 93 % — ABNORMAL HIGH (ref 40–80)
Monocyte-Macrophage-Spinal Fluid: 7 % — ABNORMAL LOW (ref 15–45)
RBC Count, CSF: 349 /mm3 — ABNORMAL HIGH
Segmented Neutrophils-CSF: 0 % (ref 0–6)
Tube #: 3
WBC, CSF: 106 /mm3 (ref 0–5)

## 2020-04-25 LAB — COMPREHENSIVE METABOLIC PANEL
ALT: 28 U/L (ref 0–44)
AST: 18 U/L (ref 15–41)
Albumin: 3.8 g/dL (ref 3.5–5.0)
Alkaline Phosphatase: 35 U/L — ABNORMAL LOW (ref 38–126)
Anion gap: 8 (ref 5–15)
BUN: 6 mg/dL (ref 6–20)
CO2: 26 mmol/L (ref 22–32)
Calcium: 9.4 mg/dL (ref 8.9–10.3)
Chloride: 103 mmol/L (ref 98–111)
Creatinine, Ser: 0.78 mg/dL (ref 0.61–1.24)
GFR calc Af Amer: >60 mL/min (ref >60)
GFR calc non Af Amer: >60 mL/min (ref >60)
Glucose, Bld: 104 mg/dL — ABNORMAL HIGH (ref 70–99)
Potassium: 4.7 mmol/L (ref 3.5–5.1)
Sodium: 137 mmol/L (ref 135–145)
Total Bilirubin: 0.9 mg/dL (ref 0.3–1.2)
Total Protein: 7 g/dL (ref 6.5–8.1)

## 2020-04-25 LAB — LACTIC ACID, PLASMA
Lactic Acid, Venous: 1.4 mmol/L (ref 0.5–1.9)
Lactic Acid, Venous: 1.5 mmol/L (ref 0.5–1.9)

## 2020-04-25 LAB — CBC WITH DIFFERENTIAL/PLATELET
Abs Immature Granulocytes: 0.02 10*3/uL (ref 0.00–0.07)
Basophils Absolute: 0 10*3/uL (ref 0.0–0.1)
Basophils Relative: 1 %
Eosinophils Absolute: 0.1 10*3/uL (ref 0.0–0.5)
Eosinophils Relative: 3 %
HCT: 44.8 % (ref 39.0–52.0)
Hemoglobin: 15.1 g/dL (ref 13.0–17.0)
Immature Granulocytes: 0 %
Lymphocytes Relative: 17 %
Lymphs Abs: 0.8 10*3/uL (ref 0.7–4.0)
MCH: 31.4 pg (ref 26.0–34.0)
MCHC: 33.7 g/dL (ref 30.0–36.0)
MCV: 93.1 fL (ref 80.0–100.0)
Monocytes Absolute: 0.6 10*3/uL (ref 0.1–1.0)
Monocytes Relative: 13 %
Neutro Abs: 2.9 10*3/uL (ref 1.7–7.7)
Neutrophils Relative %: 66 %
Platelets: 200 10*3/uL (ref 150–400)
RBC: 4.81 MIL/uL (ref 4.22–5.81)
RDW: 12.4 % (ref 11.5–15.5)
WBC: 4.5 10*3/uL (ref 4.0–10.5)
nRBC: 0 % (ref 0.0–0.2)

## 2020-04-25 LAB — URINALYSIS, ROUTINE W REFLEX MICROSCOPIC
Bilirubin Urine: NEGATIVE
Glucose, UA: NEGATIVE mg/dL
Hgb urine dipstick: NEGATIVE
Ketones, ur: NEGATIVE mg/dL
Leukocytes,Ua: NEGATIVE
Nitrite: NEGATIVE
Protein, ur: NEGATIVE mg/dL
Specific Gravity, Urine: 1.006 (ref 1.005–1.030)
pH: 5 (ref 5.0–8.0)

## 2020-04-25 LAB — PROTEIN AND GLUCOSE, CSF
Glucose, CSF: 23 mg/dL — CL (ref 40–70)
Total  Protein, CSF: 357 mg/dL — ABNORMAL HIGH (ref 15–45)

## 2020-04-25 IMAGING — MR MR CERVICAL SPINE WO/W CM
6 of 8 series · 28 of 48 positions shown · IV contrast (gadavist)
Comparison: Prior brain MRI from [DATE].
COMPARISON: Prior brain MRI from [DATE].

Addendum:
CLINICAL DATA: Initial evaluation for demyelinating disease.

EXAM:
MRI CERVICAL SPINE WITHOUT AND WITH CONTRAST
TECHNIQUE: Multiplanar and multiecho pulse sequences of the cervical spine, to
include the craniocervical junction and cervicothoracic junction,
were obtained without and with intravenous contrast.
CONTRAST:  7.5mL GADAVIST GADOBUTROL 1 MMOL/ML IV SOLN

[Series 3: T2 · sagittal · 3.0mm · 0.69mm/px · 4 of 15 slices shown (1 of 2)]
[im 1/15]
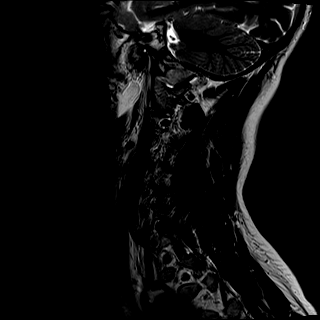
[im 5/15]
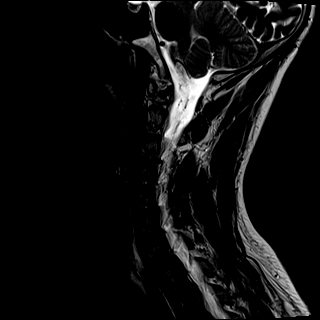
[im 10/15]
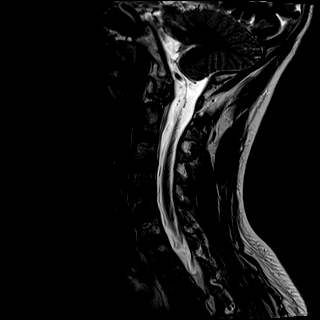
[im 15/15]
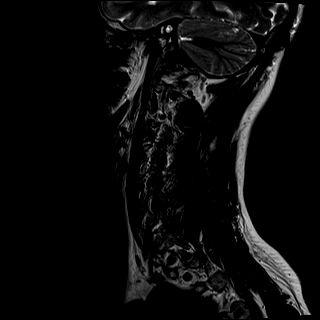

[Series 4: T1 · sagittal · 3.0mm · 0.69mm/px · 3 of 15 slices shown (1 of 2)]
[im 1/15]
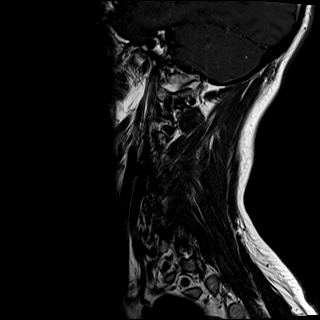
[im 8/15]
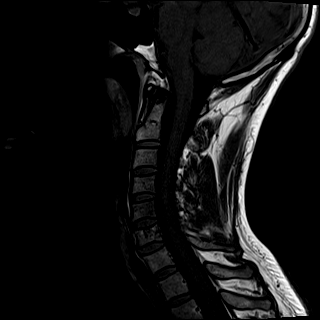
[im 15/15]
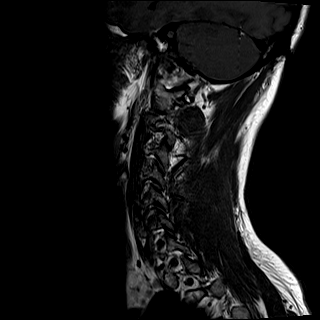

[Series 5: STIR · sagittal · 3.0mm · 0.86mm/px · 3 of 15 slices shown]
[im 1/15]
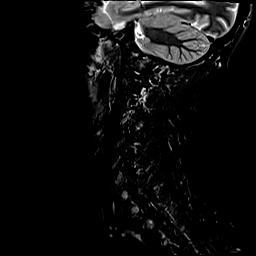
[im 8/15]
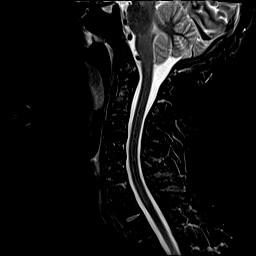
[im 15/15]
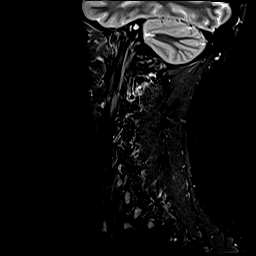

[Series 6: T2 · axial · 3.0mm · 0.66mm/px · z∈[-200,-72]mm · 8 of 38 slices shown (2 of 2)]
[im 1/38]
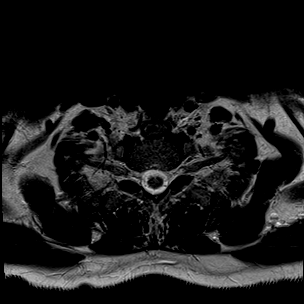
[im 6/38]
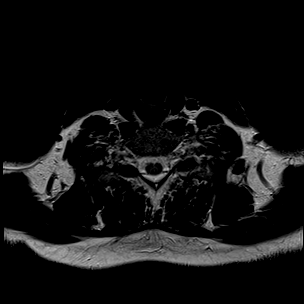
[im 11/38]
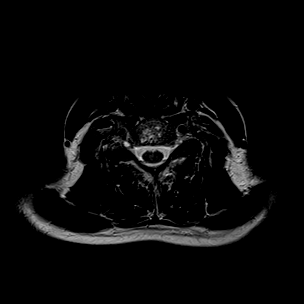
[im 16/38]
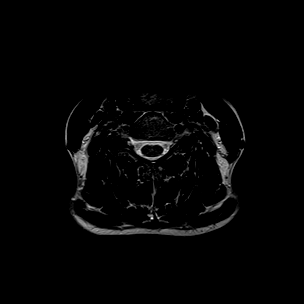
[im 22/38]
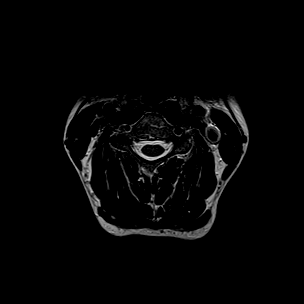
[im 27/38]
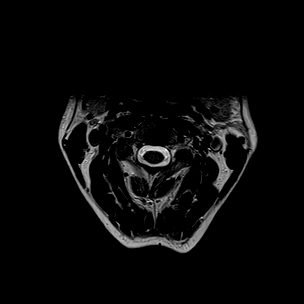
[im 32/38]
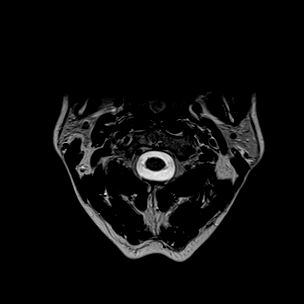
[im 38/38]
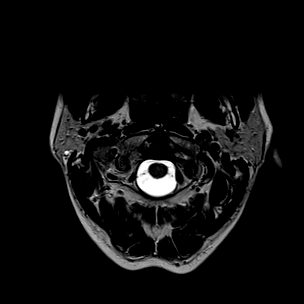

[Series 9: T1 · axial · 3.0mm · 0.35mm/px · z∈[-200,-71]mm · 9 of 40 slices shown (2 of 2)]
[im 1/40]
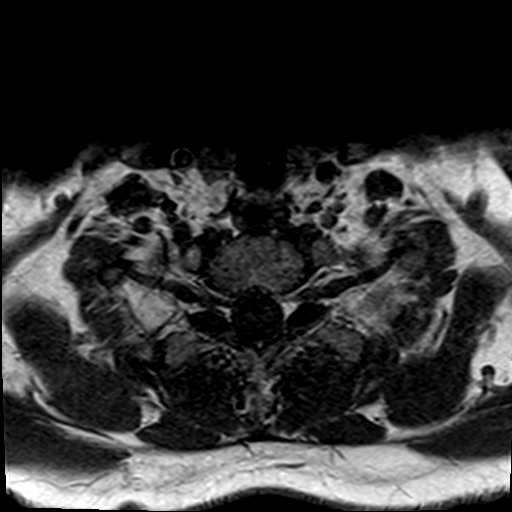
[im 5/40]
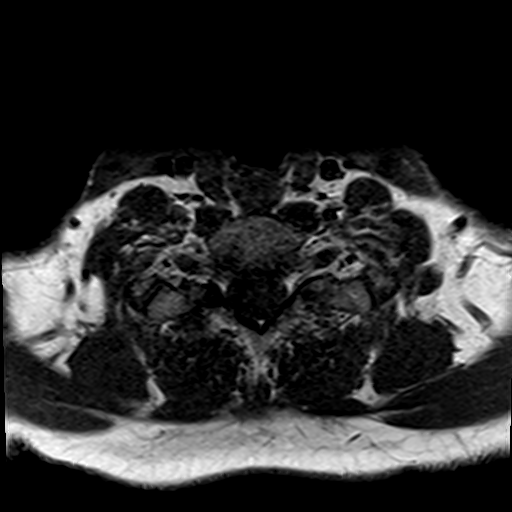
[im 10/40]
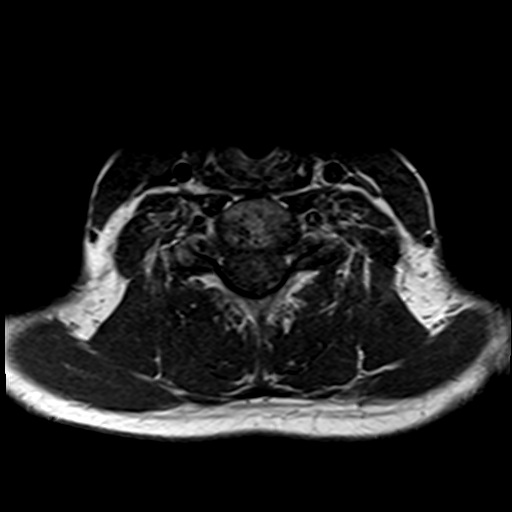
[im 15/40]
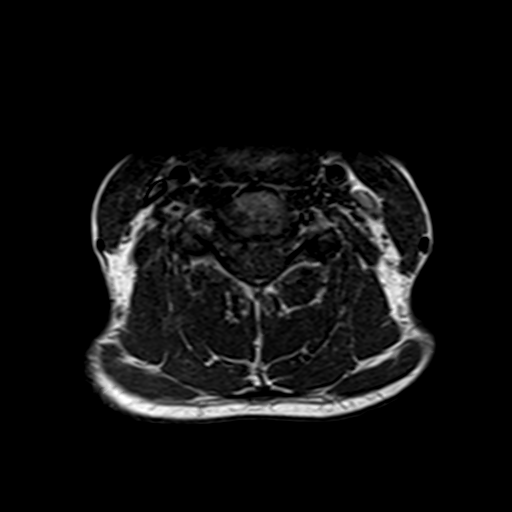
[im 20/40]
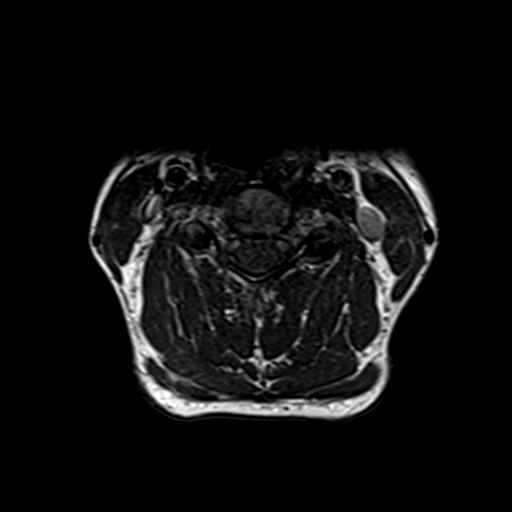
[im 25/40]
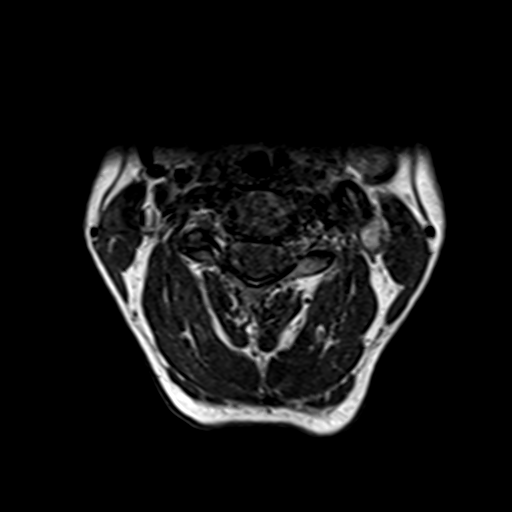
[im 30/40]
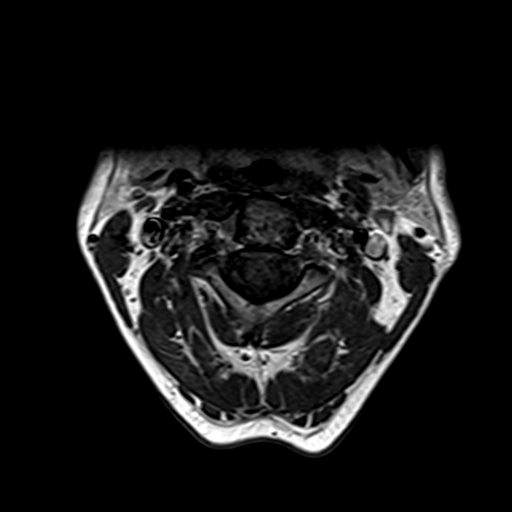
[im 35/40]
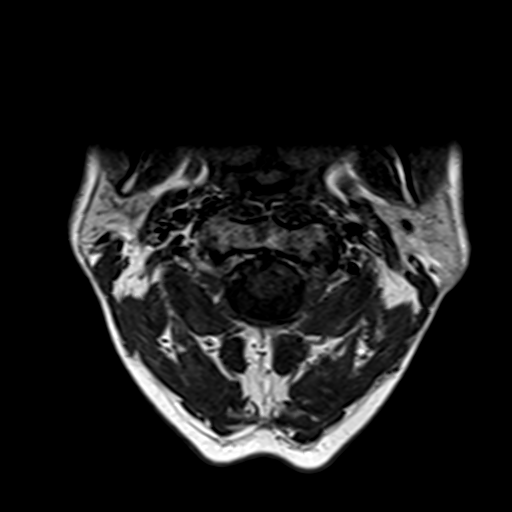
[im 40/40]
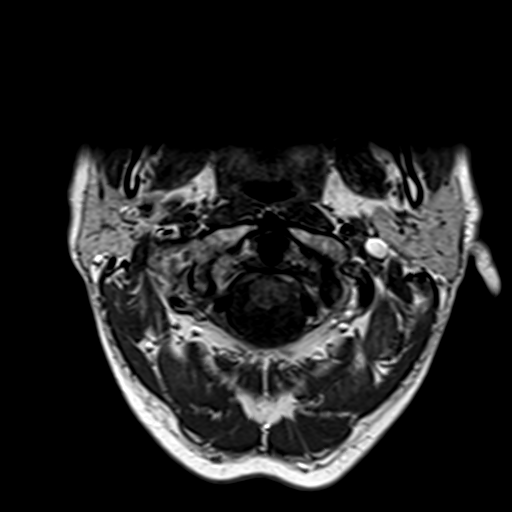

[Series 10: T1 post-contrast · sagittal · 3.0mm · 0.43mm/px · 1 of 15 slices shown]
[im 1/15]
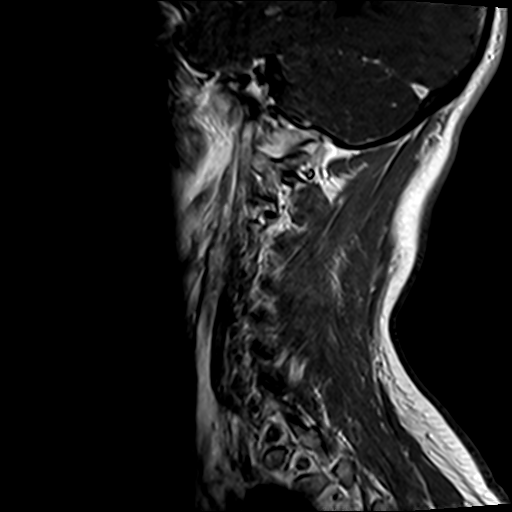

[28 of 48 positions shown; findings below may reference images not displayed]

FINDINGS: Alignment: Physiologic with preservation of the normal cervical
lordosis. No listhesis.

Vertebrae: 1 cm T2/stir hyperintense lesion within the C6 vertebral
body most likely reflects an atypical hemangioma. Associated minimal
central vertebral body height loss. Additional small benign
hemangioma noted within the C7 vertebral body. No other discrete or
worrisome osseous lesions. Underlying bone marrow signal intensity
within normal limits. Vertebral body height otherwise maintained
without acute or chronic fracture. No other abnormal marrow edema or
enhancement.

Cord: Normal signal and morphology. No cord signal changes to
suggest demyelinating disease. No abnormal enhancement.

Posterior Fossa, vertebral arteries, paraspinal tissues: Negative.

Disc levels:

C2-C3: Unremarkable.

C3-C4: Minimal annular disc bulge with uncovertebral hypertrophy. No
significant spinal stenosis. Foramina remain patent.

C4-C5: Mild annular disc bulge with uncovertebral hypertrophy. No
significant spinal stenosis. Foramina remain patent.

C5-C6: Disc desiccation with mild annular disc bulge. No spinal
stenosis. Foramina remain patent.

C6-C7: Disc desiccation without significant disc bulge. No canal or
foraminal stenosis. No impingement.

C7-T1:  Unremarkable.

Visualized upper thoracic spine demonstrates no significant finding.
IMPRESSION: 1. Normal MRI of the cervical spinal cord. No evidence for
demyelinating disease. No abnormal enhancement.
2. Mild for age degenerative spondylosis as above. No significant
spinal stenosis or neural foraminal narrowing.

ADDENDUM:
Upon further consideration and review of the corresponding
postcontrast brain MRI, note is made of subtle circumferential
leptomeningeal enhancement about the upper cervical spinal cord,
extending from the cervicomedullary region to approximately C2-3.
Associated leptomeningeal enhancement noted about the brainstem and
cerebellum, better evaluated on corresponding brain MRI. Given the
history of fevers, headaches, and abnormal CSF values, findings most
concerning for acute infection/meningitis. No associated cord edema
or frank intramedullary enhancement.

*** End of Addendum ***
FINDINGS: Alignment: Physiologic with preservation of the normal cervical
lordosis. No listhesis.

Vertebrae: 1 cm T2/stir hyperintense lesion within the C6 vertebral
body most likely reflects an atypical hemangioma. Associated minimal
central vertebral body height loss. Additional small benign
hemangioma noted within the C7 vertebral body. No other discrete or
worrisome osseous lesions. Underlying bone marrow signal intensity
within normal limits. Vertebral body height otherwise maintained
without acute or chronic fracture. No other abnormal marrow edema or
enhancement.

Cord: Normal signal and morphology. No cord signal changes to
suggest demyelinating disease. No abnormal enhancement.

Posterior Fossa, vertebral arteries, paraspinal tissues: Negative.

Disc levels:

C2-C3: Unremarkable.

C3-C4: Minimal annular disc bulge with uncovertebral hypertrophy. No
significant spinal stenosis. Foramina remain patent.

C4-C5: Mild annular disc bulge with uncovertebral hypertrophy. No
significant spinal stenosis. Foramina remain patent.

C5-C6: Disc desiccation with mild annular disc bulge. No spinal
stenosis. Foramina remain patent.

C6-C7: Disc desiccation without significant disc bulge. No canal or
foraminal stenosis. No impingement.

C7-T1:  Unremarkable.

Visualized upper thoracic spine demonstrates no significant finding.
IMPRESSION: 1. Normal MRI of the cervical spinal cord. No evidence for
demyelinating disease. No abnormal enhancement.
2. Mild for age degenerative spondylosis as above. No significant
spinal stenosis or neural foraminal narrowing.

## 2020-04-25 IMAGING — MR MR MRA HEAD W/O CM
1 series · 25 of 48 positions shown · IV contrast (gadavist)
Comparison: None.

CLINICAL DATA: Neuro deficit, transient right arm weakness with
aphasia.

EXAM:
MR HEAD WITHOUT CONTRAST
MR CIRCLE OF WILLIS WITHOUT CONTRAST
MRA OF THE NECK WITHOUT AND WITH CONTRAST
TECHNIQUE: Multiplanar, multiecho pulse sequences of the brain, circle of
willis and surrounding structures were obtained without intravenous
contrast. Angiographic images of the neck were obtained using MRA
technique without and with intravenous contrast.
CONTRAST:  8.5mL GADAVIST GADOBUTROL 1 MMOL/ML IV SOLN

[Series 5: 3d cow · axial · 0.5mm · 0.43mm/px · z∈[-31,+54]mm · 25 of 188 slices shown]
[im 1/188]
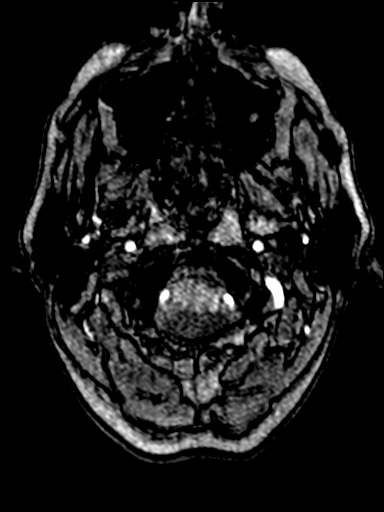
[im 4/188]
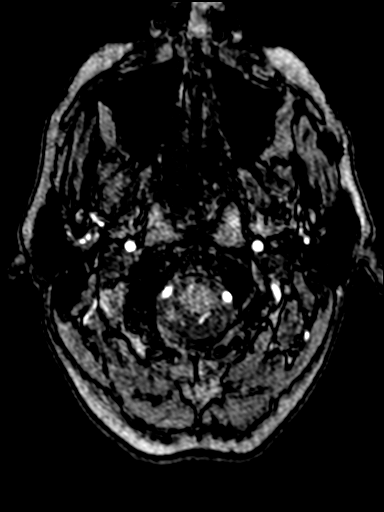
[im 8/188]
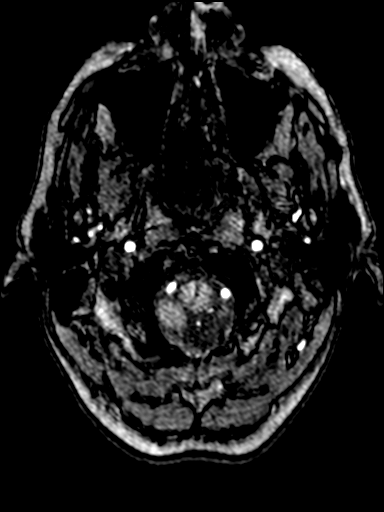
[im 12/188]
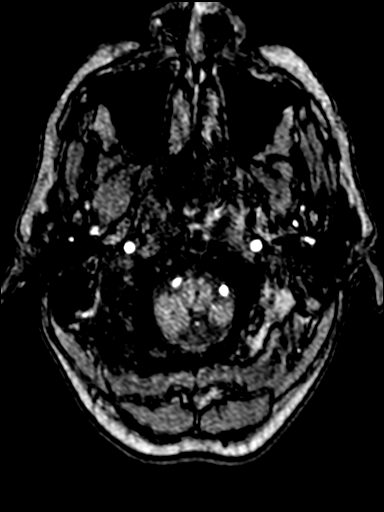
[im 16/188]
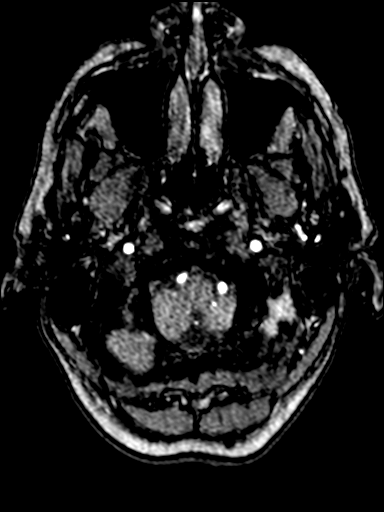
[im 20/188]
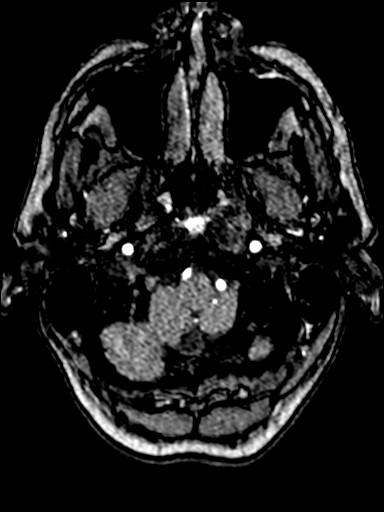
[im 24/188]
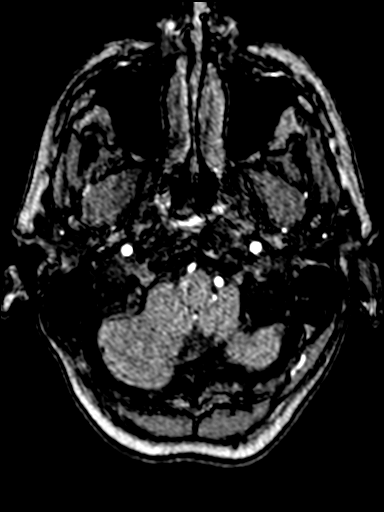
[im 28/188]
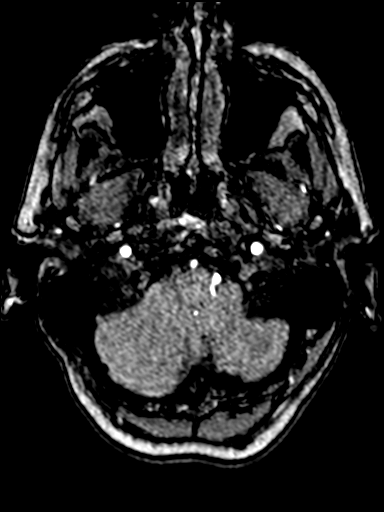
[im 32/188]
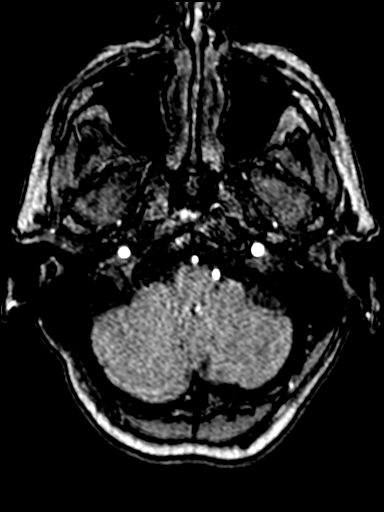
[im 36/188]
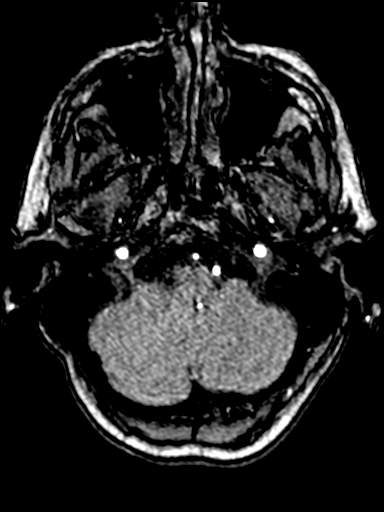
[im 40/188]
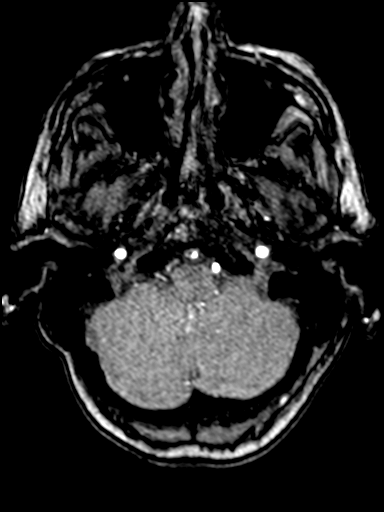
[im 44/188]
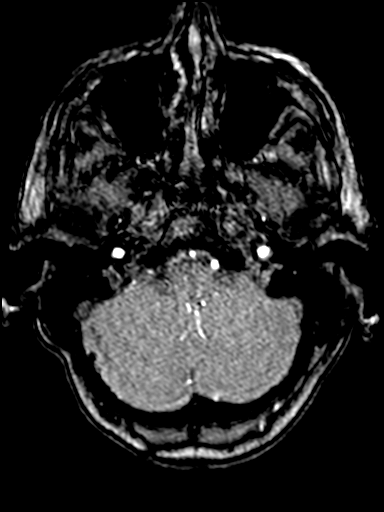
[im 48/188]
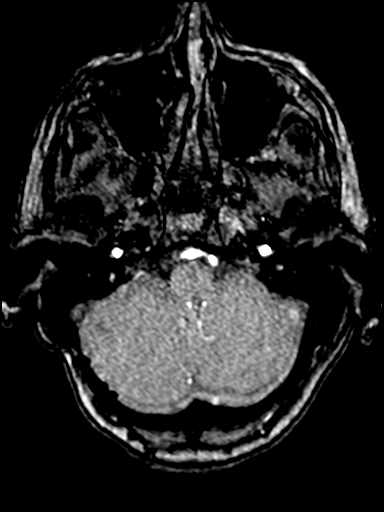
[im 52/188]
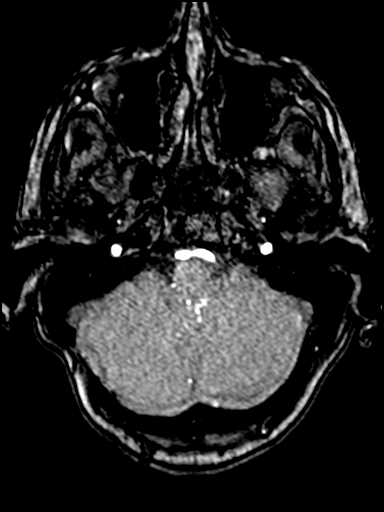
[im 56/188]
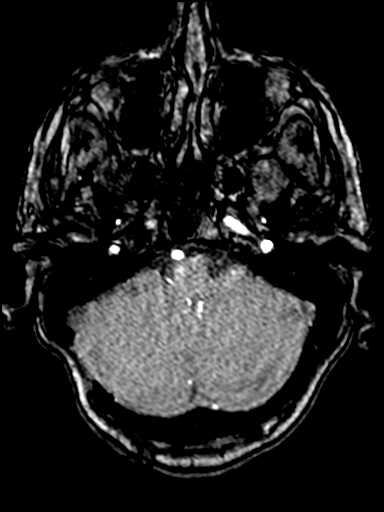
[im 60/188]
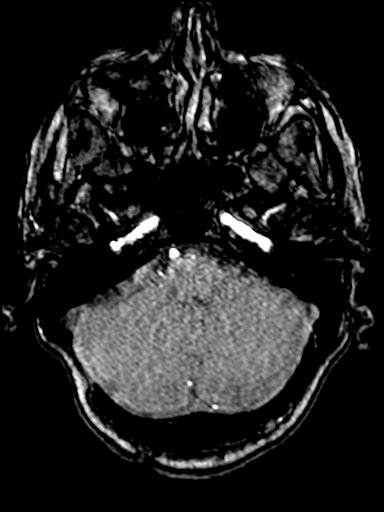
[im 64/188]
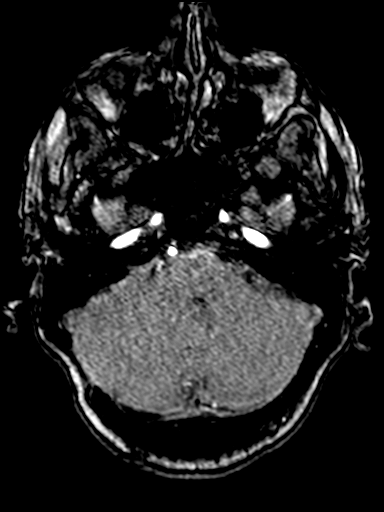
[im 68/188]
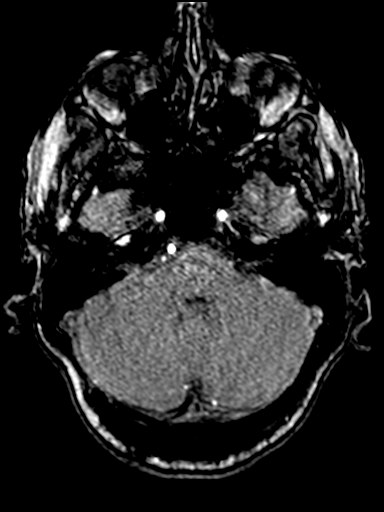
[im 84/188]
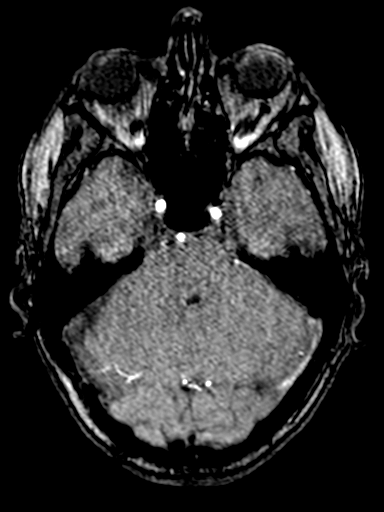
[im 96/188]
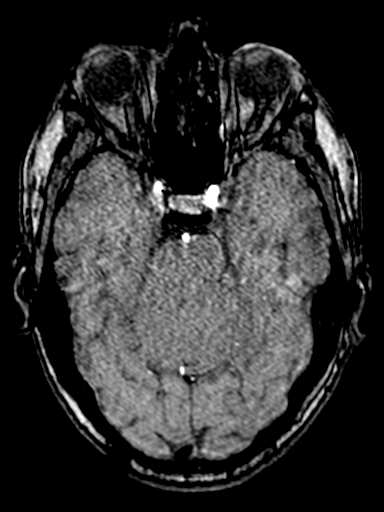
[im 108/188]
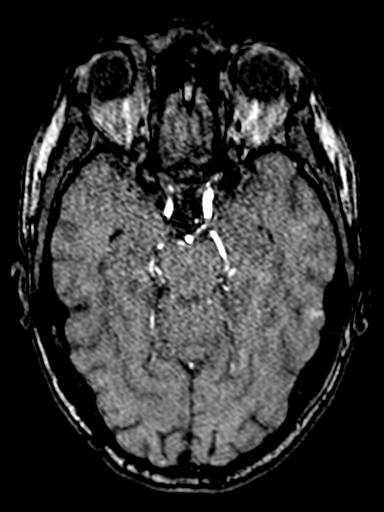
[im 132/188]
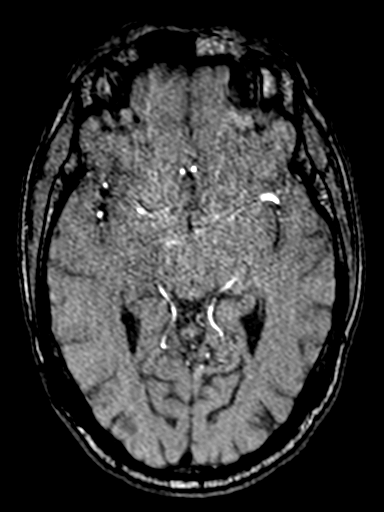
[im 156/188]
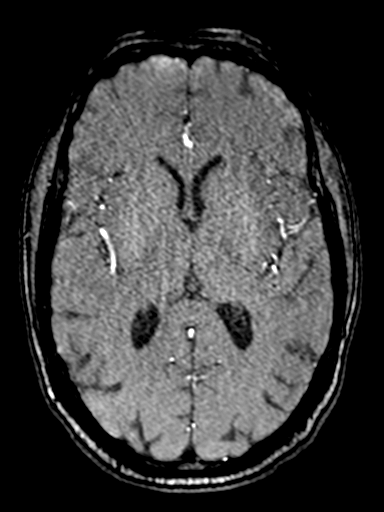
[im 160/188]
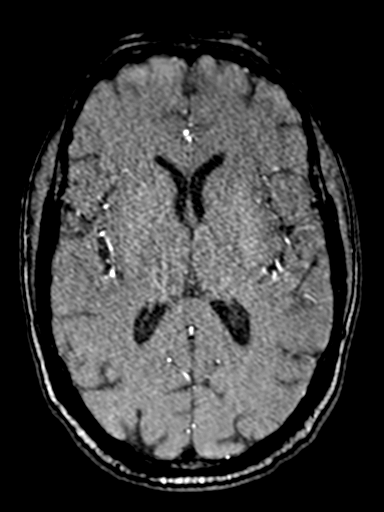
[im 180/188]
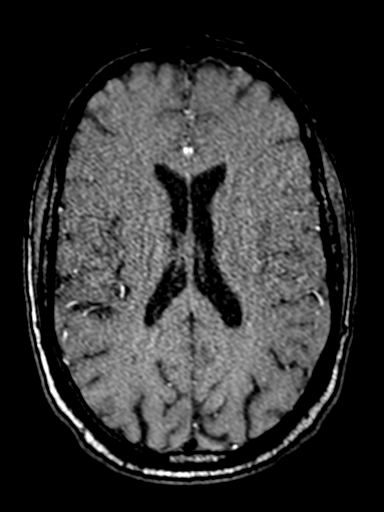

[25 of 48 positions shown; findings below may reference images not displayed]

FINDINGS: MR HEAD FINDINGS

Brain: T2/FLAIR hyperintense foci involving the right middle
cerebellar peduncle ([DATE], 6). No diffusion-weighted signal
abnormality. No intracranial hemorrhage. No midline shift,
ventriculomegaly or extra-axial fluid collection. No mass lesion.

Vascular: Please see CTA head.

Skull and upper cervical spine: Normal marrow signal.

Sinuses/Orbits: Normal orbits. Clear paranasal sinuses. Trace
mastoid effusion.

Other: Right frontal scalp susceptibility artifact.

MR CIRCLE OF WILLIS FINDINGS

Anterior circulation: The bilateral internal carotid, anterior and
middle cerebral arteries are patent and normal caliber. No
significant stenosis, proximal occlusion, aneurysm, or vascular
malformation.

Posterior circulation: Dominant left vertebral artery. Patent
bilateral PICA. Dominant right AICA. Normal caliber patent basilar,
superior cerebellar and posterior cerebral arteries. No significant
stenosis, proximal occlusion, aneurysm, or vascular malformation.

Venous sinuses: No evidence of thrombosis.

Anatomic variants: Bilateral PCOM hypoplasia.

MRA NECK FINDINGS

There is no high-grade narrowing or focal aneurysm involving the
bilateral carotid arteries. No evidence of dissection.

The bilateral vertebral arteries are patent and demonstrate
antegrade flow. Dominant left vertebral artery. No evidence of
high-grade narrowing or focal aneurysm.

Three vessel aortic arch.
IMPRESSION: MRI HEAD: T2/FLAIR hyperintense foci involving the right middle
cerebellar peduncle are suspicious for demyelination. Consider
postcontrast imaging for further evaluation.

MRA HEAD: Normal MRA head.

MRA NECK: Normal MRA neck.

## 2020-04-25 IMAGING — MR MR MRA NECK WO/W CM
8 series · 43 of 48 positions shown · IV contrast (gadavist)
Comparison: None.

CLINICAL DATA: Neuro deficit, transient right arm weakness with
aphasia.

EXAM:
MR HEAD WITHOUT CONTRAST
MR CIRCLE OF WILLIS WITHOUT CONTRAST
MRA OF THE NECK WITHOUT AND WITH CONTRAST
TECHNIQUE: Multiplanar, multiecho pulse sequences of the brain, circle of
willis and surrounding structures were obtained without intravenous
contrast. Angiographic images of the neck were obtained using MRA
technique without and with intravenous contrast.
CONTRAST:  8.5mL GADAVIST GADOBUTROL 1 MMOL/ML IV SOLN

[Series 11: tof_fl3d_tra_iso · axial · 0.6mm · 0.52mm/px · z∈[-152,-67]mm · 8 of 146 slices shown]
[im 1/146]
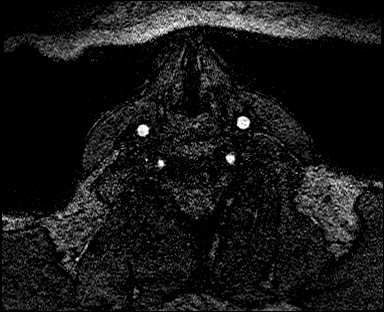
[im 21/146]
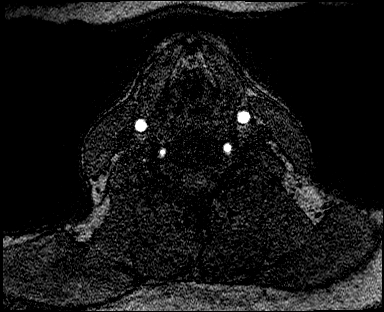
[im 42/146]
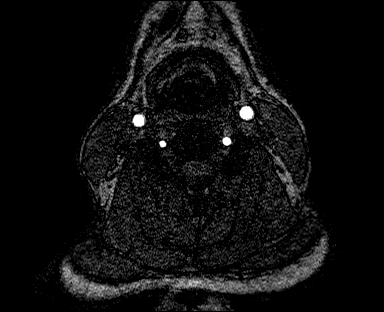
[im 63/146]
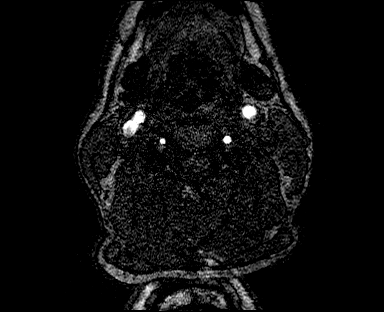
[im 83/146]
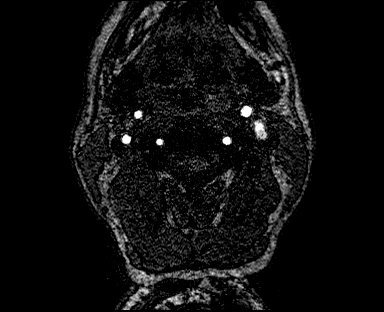
[im 104/146]
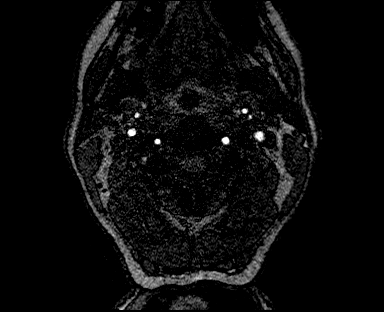
[im 125/146]
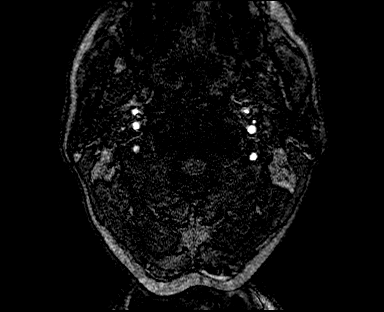
[im 146/146]
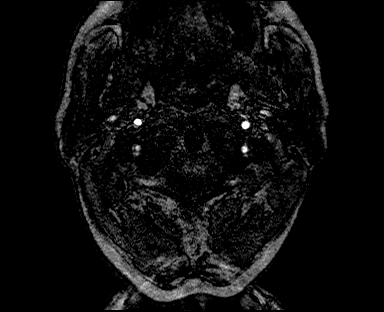

[Series 14: angio_fl3d_cor_pre_ttc=3.0s · coronal · 0.9mm · 0.85mm/px · 5 of 88 slices shown]
[im 1/88]
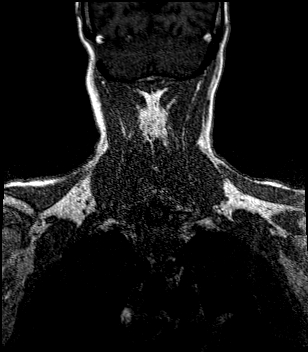
[im 22/88]
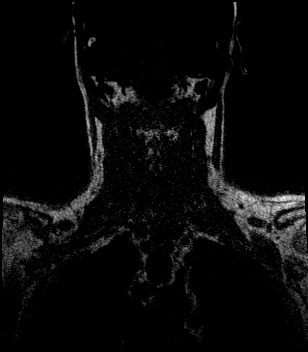
[im 44/88]
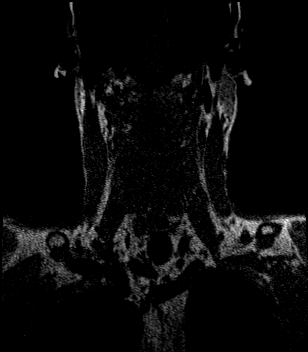
[im 66/88]
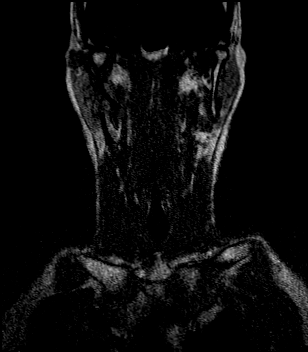
[im 88/88]
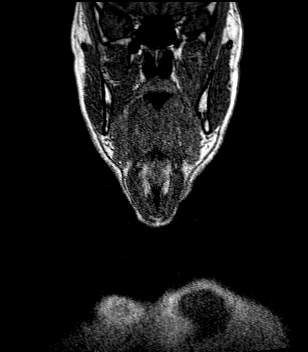

[Series 16: angio_fl3d_cor_post_ttc=3.0s · coronal · 0.9mm · 0.85mm/px · 5 of 88 slices shown (1 of 2)]
[im 1/88]
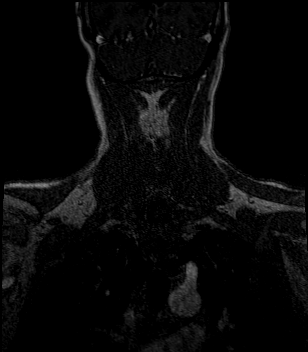
[im 22/88]
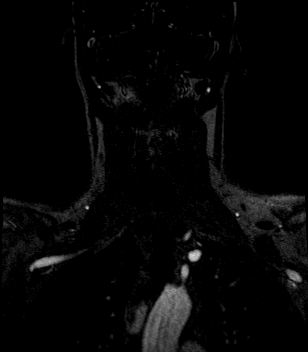
[im 44/88]
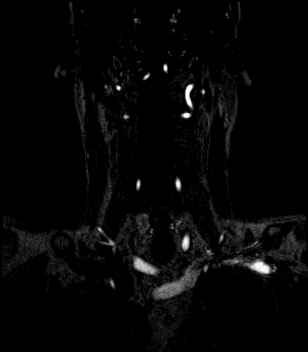
[im 66/88]
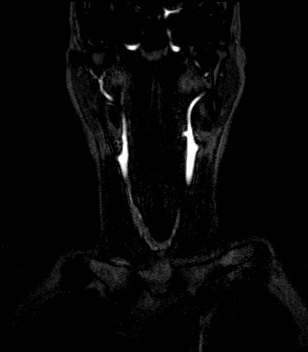
[im 88/88]
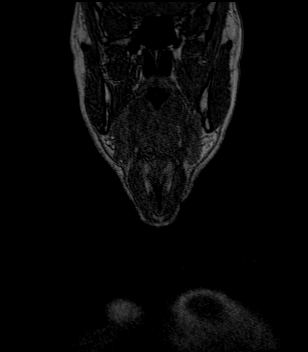

[Series 17: angio_fl3d_cor_post_ttc=3.0s_moco-adv · coronal · 0.9mm · 0.85mm/px · 6 of 88 slices shown (1 of 2)]
[im 1/88]
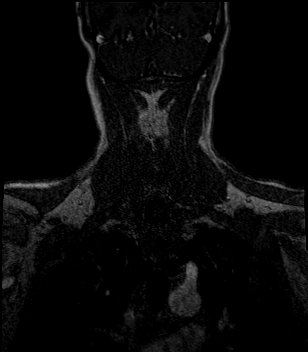
[im 18/88]
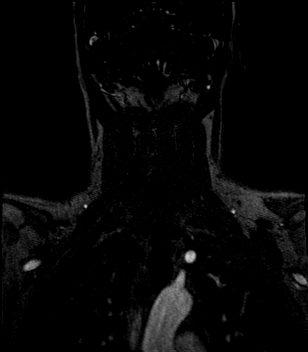
[im 35/88]
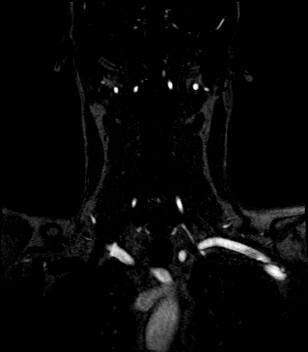
[im 53/88]
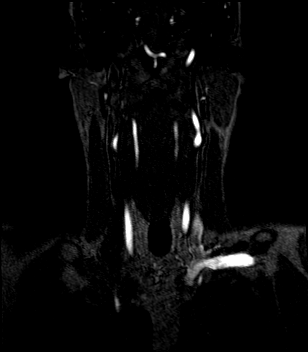
[im 70/88]
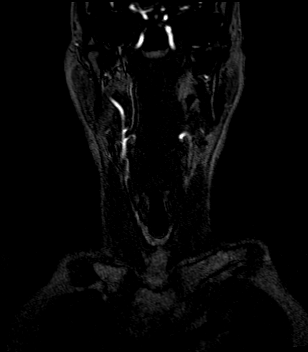
[im 88/88]
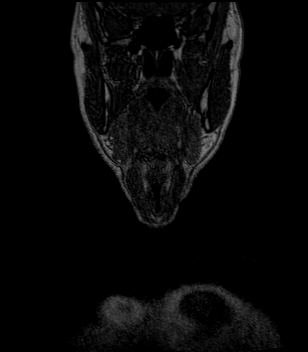

[Series 18: angio_fl3d_cor_post_ttc=3.0s_moco-adv_sub · coronal · 0.9mm · 0.85mm/px · 6 of 88 slices shown (1 of 2)]
[im 1/88]
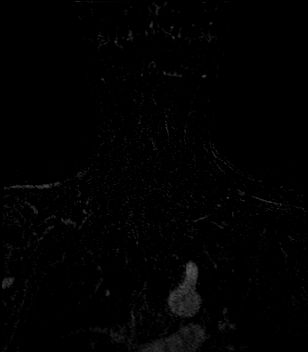
[im 18/88]
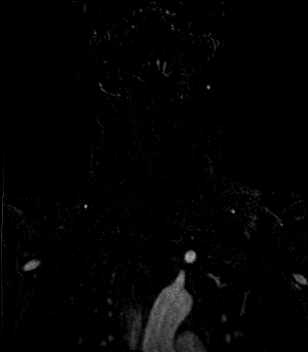
[im 35/88]
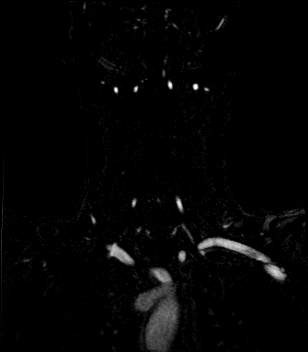
[im 53/88]
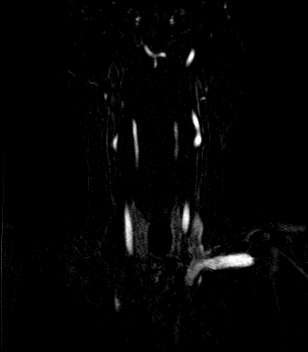
[im 70/88]
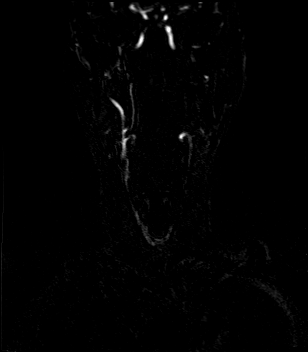
[im 88/88]
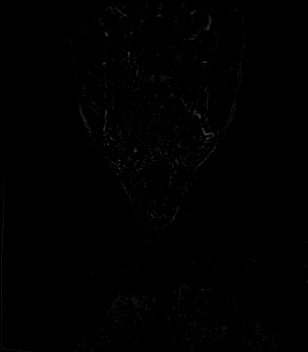

[Series 20: angio_fl3d_cor_post_ttc=3.0s · coronal · 0.9mm · 0.85mm/px · 6 of 88 slices shown (2 of 2)]
[im 1/88]
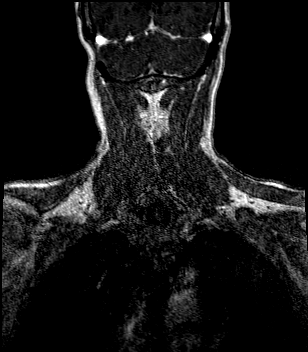
[im 18/88]
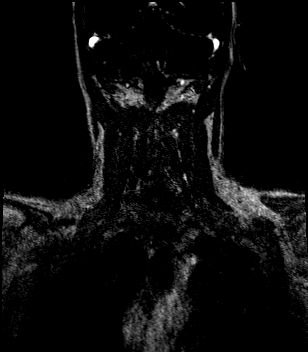
[im 35/88]
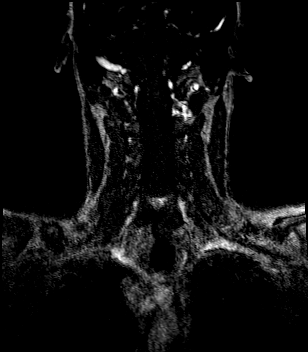
[im 53/88]
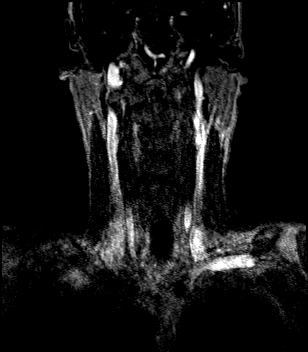
[im 70/88]
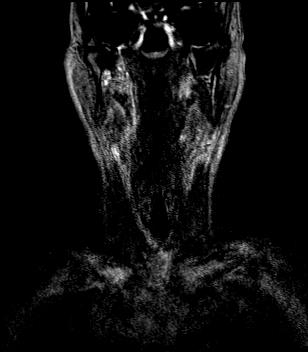
[im 88/88]
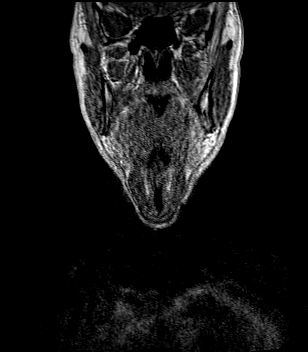

[Series 21: angio_fl3d_cor_post_ttc=3.0s_moco-adv · coronal · 0.9mm · 0.85mm/px · 6 of 88 slices shown (2 of 2)]
[im 1/88]
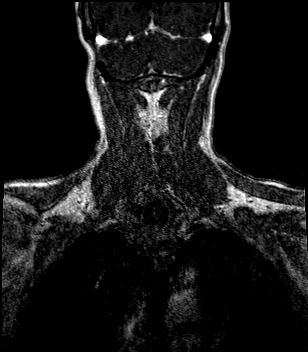
[im 18/88]
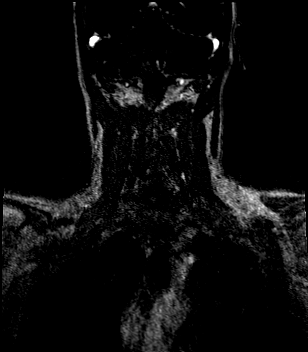
[im 35/88]
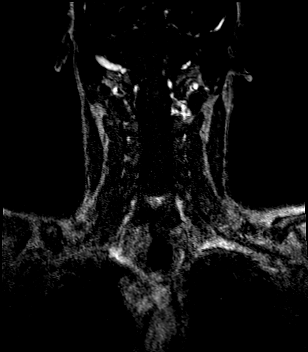
[im 53/88]
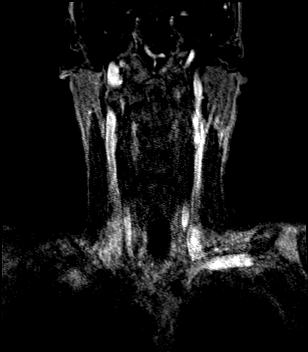
[im 70/88]
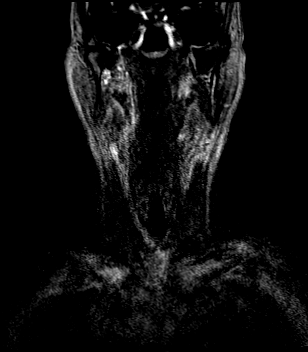
[im 88/88]
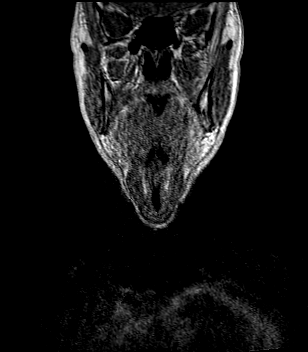

[Series 22: angio_fl3d_cor_post_ttc=3.0s_moco-adv_sub · coronal · 0.9mm · 0.85mm/px · 1 of 88 slices shown (2 of 2)]
[im 1/88]
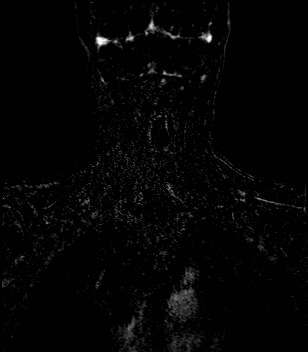

[43 of 48 positions shown; findings below may reference images not displayed]

FINDINGS: MR HEAD FINDINGS

Brain: T2/FLAIR hyperintense foci involving the right middle
cerebellar peduncle ([DATE], 6). No diffusion-weighted signal
abnormality. No intracranial hemorrhage. No midline shift,
ventriculomegaly or extra-axial fluid collection. No mass lesion.

Vascular: Please see CTA head.

Skull and upper cervical spine: Normal marrow signal.

Sinuses/Orbits: Normal orbits. Clear paranasal sinuses. Trace
mastoid effusion.

Other: Right frontal scalp susceptibility artifact.

MR CIRCLE OF WILLIS FINDINGS

Anterior circulation: The bilateral internal carotid, anterior and
middle cerebral arteries are patent and normal caliber. No
significant stenosis, proximal occlusion, aneurysm, or vascular
malformation.

Posterior circulation: Dominant left vertebral artery. Patent
bilateral PICA. Dominant right AICA. Normal caliber patent basilar,
superior cerebellar and posterior cerebral arteries. No significant
stenosis, proximal occlusion, aneurysm, or vascular malformation.

Venous sinuses: No evidence of thrombosis.

Anatomic variants: Bilateral PCOM hypoplasia.

MRA NECK FINDINGS

There is no high-grade narrowing or focal aneurysm involving the
bilateral carotid arteries. No evidence of dissection.

The bilateral vertebral arteries are patent and demonstrate
antegrade flow. Dominant left vertebral artery. No evidence of
high-grade narrowing or focal aneurysm.

Three vessel aortic arch.
IMPRESSION: MRI HEAD: T2/FLAIR hyperintense foci involving the right middle
cerebellar peduncle are suspicious for demyelination. Consider
postcontrast imaging for further evaluation.

MRA HEAD: Normal MRA head.

MRA NECK: Normal MRA neck.

## 2020-04-25 IMAGING — MR MR HEAD W/ CM
4 of 6 series · 19 of 48 positions shown · IV contrast (gadavist)
Comparison: Prior brain MRI from [DATE].

CLINICAL DATA: Follow-up examination for demyelinating disease,
headaches, fevers.

EXAM:
MRI HEAD WITH CONTRAST
TECHNIQUE: Multiplanar, multiecho pulse sequences of the brain and surrounding
structures were obtained with intravenous contrast.
CONTRAST:  7.5mL GADAVIST GADOBUTROL 1 MMOL/ML IV SOLN

[Series 2: t2_space_dark-fluid_sag_p2_ns-ir · sagittal · 1.0mm · 0.49mm/px · 8 of 176 slices shown]
[im 1/176]
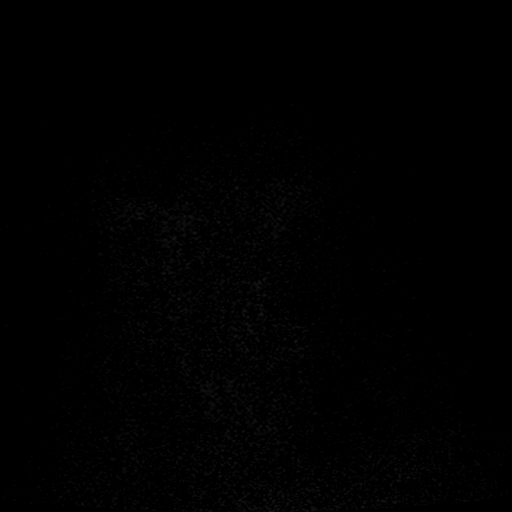
[im 27/176]
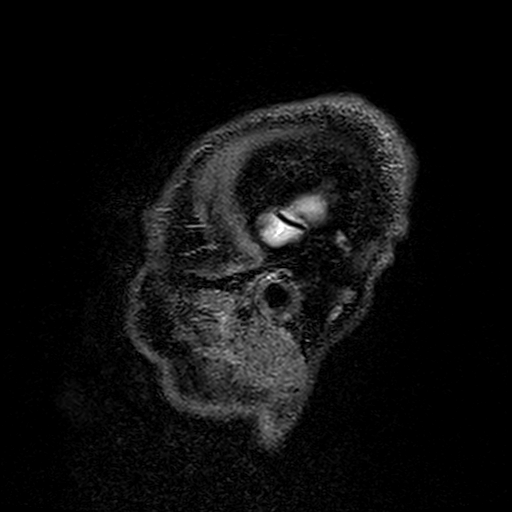
[im 54/176]
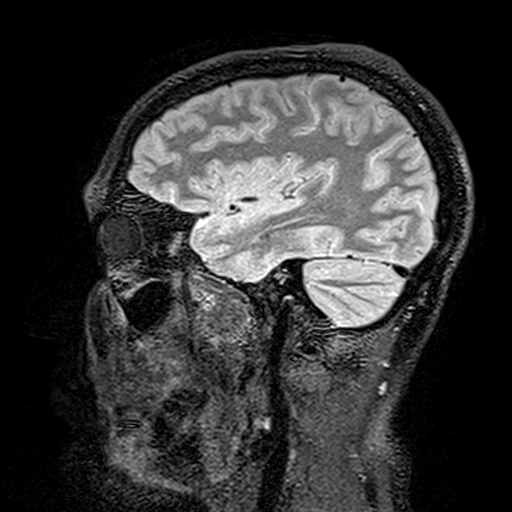
[im 81/176]
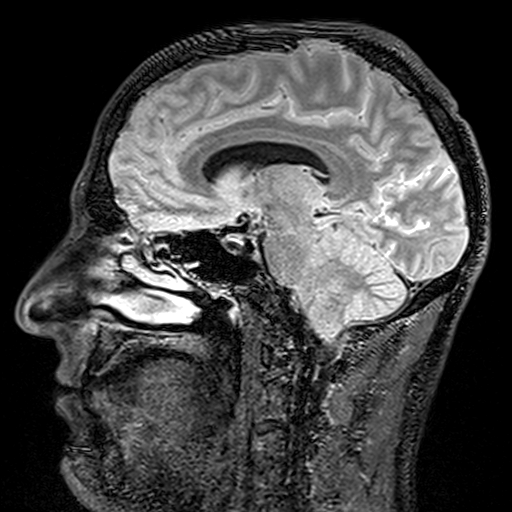
[im 95/176]
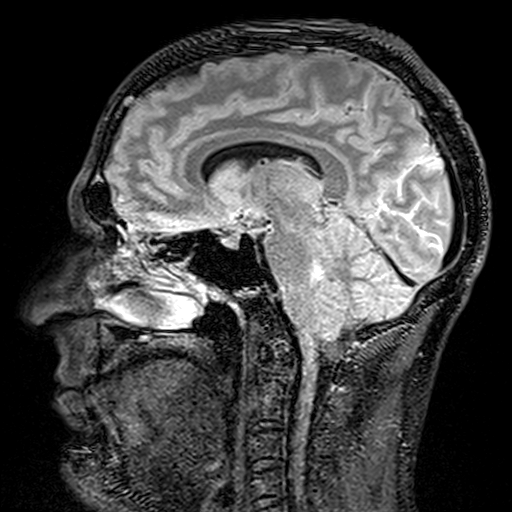
[im 122/176]
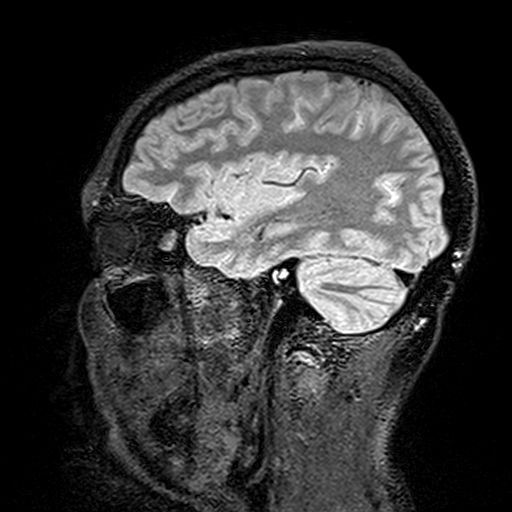
[im 149/176]
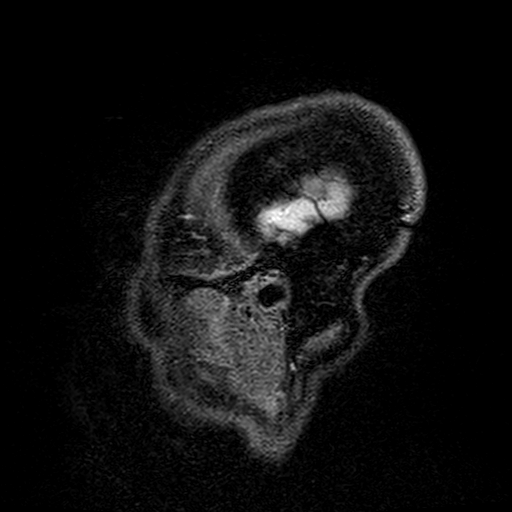
[im 176/176]
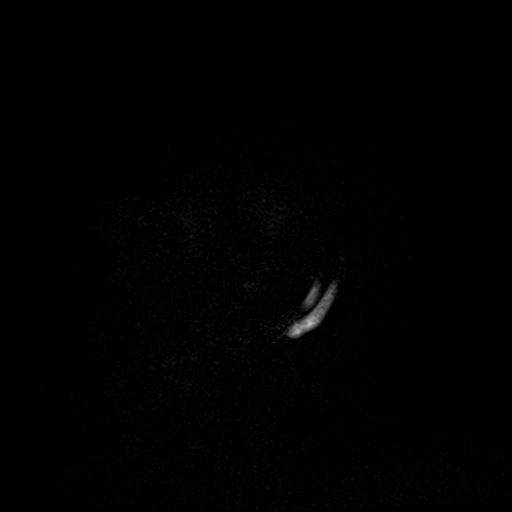

[Series 3: t2_space_dark-fluid_sag_p2_ns-ir_mpr_ axial · axial · 1.0mm · 0.45mm/px · z∈[-36,+108]mm · 7 of 163 slices shown]
[im 1/163]
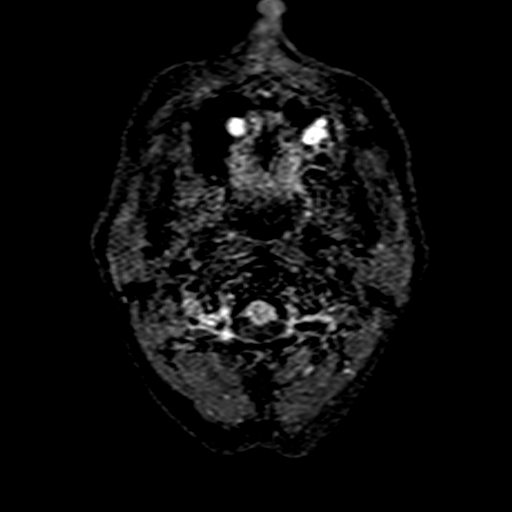
[im 30/163]
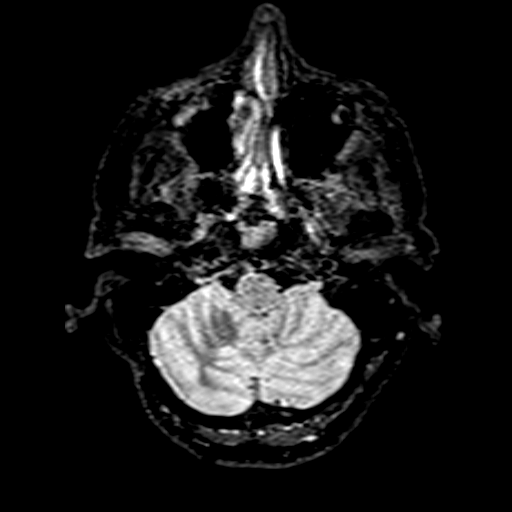
[im 45/163]
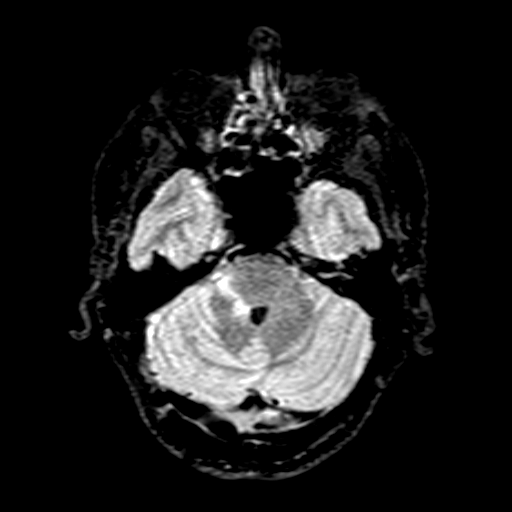
[im 74/163]
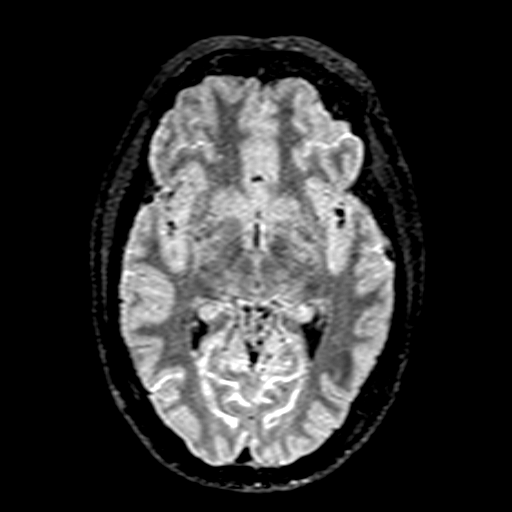
[im 89/163]
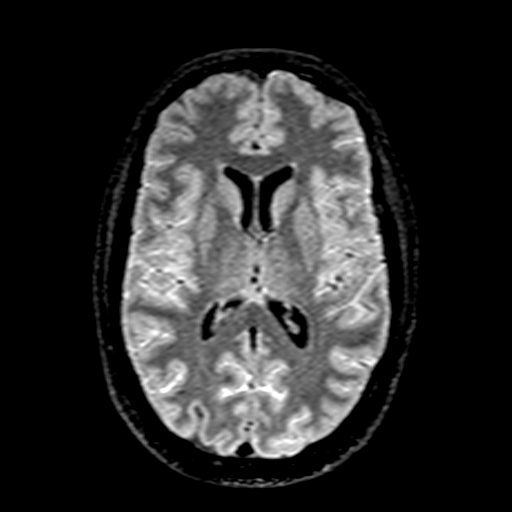
[im 118/163]
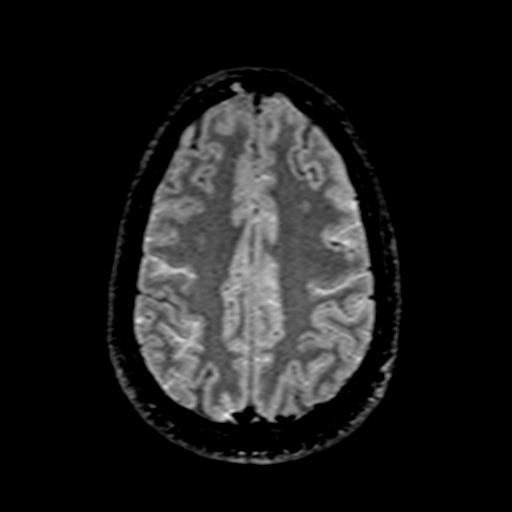
[im 148/163]
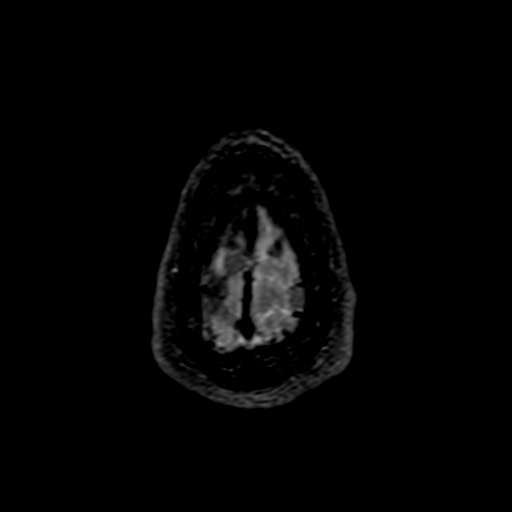

[Series 6: T1 post-contrast · coronal · 5.0mm · 0.34mm/px · 2 of 30 slices shown (1 of 2)]
[im 1/30]
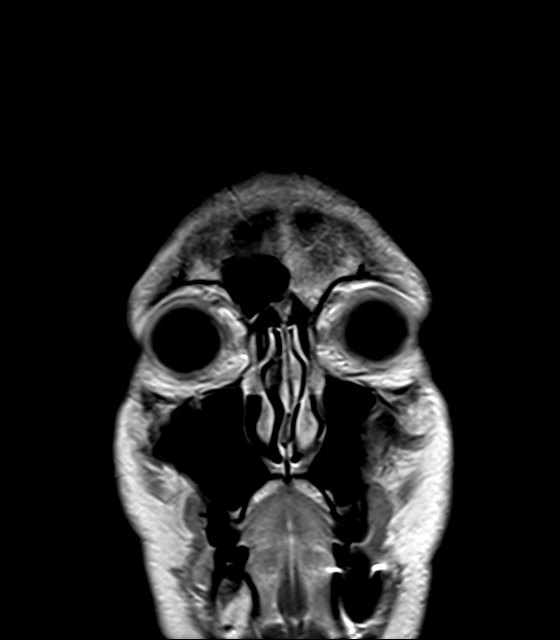
[im 30/30]
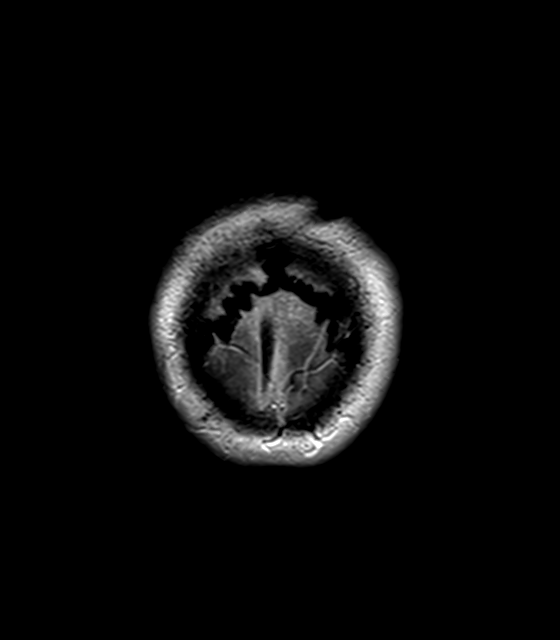

[Series 7: T1 post-contrast · sagittal · 5.0mm · 0.75mm/px · 2 of 25 slices shown (2 of 2)]
[im 1/25]
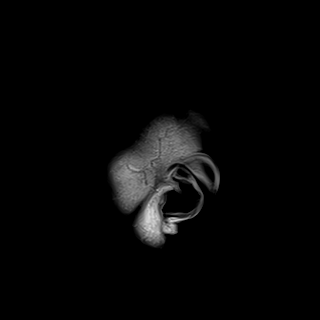
[im 25/25]
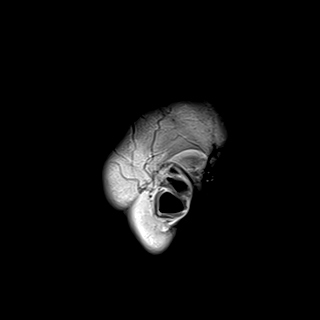

[19 of 48 positions shown; findings below may reference images not displayed]

FINDINGS: Thin-section FLAIR sequences were performed prior to the
administration of IV contrast. Images demonstrate diffuse abnormal
FLAIR signal intensity involving the cortical sulci/leptomeningeal
both cerebral hemispheres, most pronounced at the parieto-occipital
regions, fairly symmetric in nature. Additionally, abnormal subtle
abnormal leptomeningeal FLAIR signal intensity seen within the right
greater than left cerebellum. Patchy parenchymal signal abnormality
noted at the right middle cerebellar peduncle, corresponding with
abnormality on prior brain MRI. Abnormal FLAIR signal extends to
involve the right [DATE] complex and right internal auditory canal,
with probable involvement of the right inner ear structures as well
(series 3, image 39). Suspected trace intraventricular debris
(series 3, image 77).

Following contrast administration, scattered leptomeningeal
enhancement seen about the right greater than left cerebellum. Mild
patchy parenchymal enhancement noted at the level of the right
middle cerebellar peduncle signal abnormality (series 5, image 16).
Additionally, prominent leptomeningeal and/or dural enhancement
noted at the right CP angle cistern, extending into the right IAC
(series 5, image 15). No restricted diffusion seen at the level of
the right CP angle cistern on prior brain MRI to suggest abscess at
this location. Extension to involve the upper cervical spinal cord
again noted, described on corresponding cervical spine MRI. Subtle
leptomeningeal enhancement noted about the parieto-occipital regions
as well. Given the history of in fevers, headaches, and abnormal CSF
values, findings most concerning for acute meningitis with
associated cerebritis at the right middle cerebellar peduncle. No
frank abscess or other collection. Normal opacification of the major
dural sinuses noted. No appreciable ependymal enhancement to suggest
associated ventriculitis.
IMPRESSION: Findings concerning for acute basilar predominant meningitis with
associated cerebritis at the right middle cerebellar peduncle as
above. Given the basilar predilection, atypical etiologies including
TB and/or fungal meningitis could be considered. No discrete
abscess, hydrocephalus, or other complication at this time.

## 2020-04-25 IMAGING — MR MR HEAD W/O CM
10 of 11 series · 43 of 48 positions shown · IV contrast (gadavist)
Comparison: None.

CLINICAL DATA: Neuro deficit, transient right arm weakness with
aphasia.

EXAM:
MR HEAD WITHOUT CONTRAST
MR CIRCLE OF WILLIS WITHOUT CONTRAST
MRA OF THE NECK WITHOUT AND WITH CONTRAST
TECHNIQUE: Multiplanar, multiecho pulse sequences of the brain, circle of
willis and surrounding structures were obtained without intravenous
contrast. Angiographic images of the neck were obtained using MRA
technique without and with intravenous contrast.
CONTRAST:  8.5mL GADAVIST GADOBUTROL 1 MMOL/ML IV SOLN

[Series 5: DWI · axial · 3.0mm · 0.88mm/px · z∈[-36,+110]mm · 9 of 104 slices shown (1 of 4)]
[im 1/104]
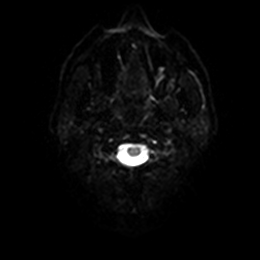
[im 13/104]
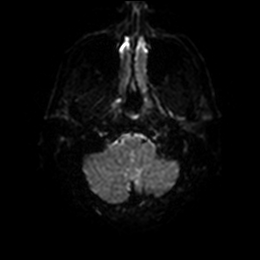
[im 26/104]
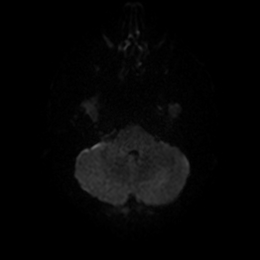
[im 39/104]
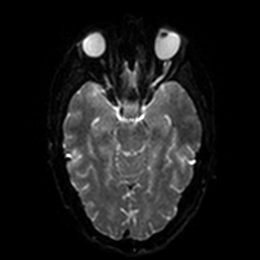
[im 52/104]
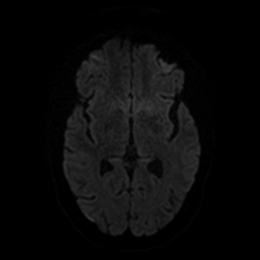
[im 65/104]
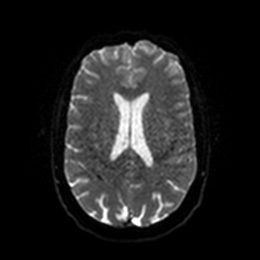
[im 78/104]
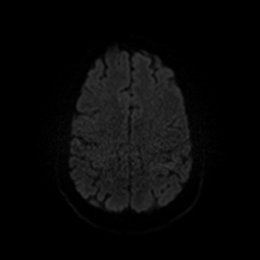
[im 91/104]
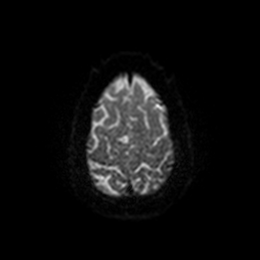
[im 104/104]
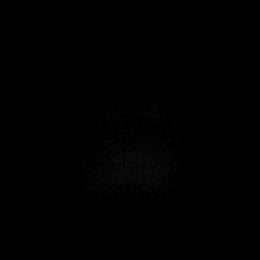

[Series 6: DWI · axial · 3.0mm · 0.88mm/px · z∈[-36,+110]mm · 5 of 52 slices shown (2 of 4)]
[im 1/52]
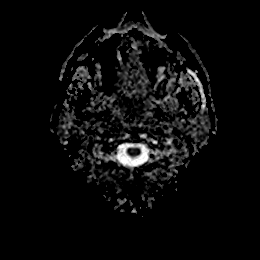
[im 13/52]
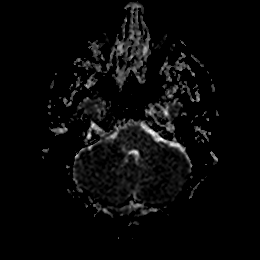
[im 26/52]
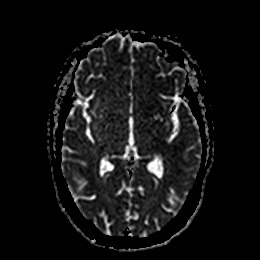
[im 39/52]
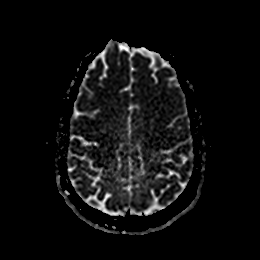
[im 52/52]
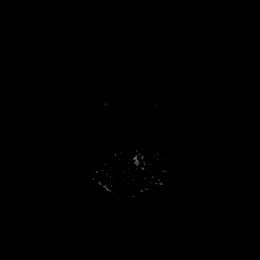

[Series 7: DWI · coronal · 4.0mm · 0.88mm/px · 7 of 76 slices shown (3 of 4)]
[im 1/76]
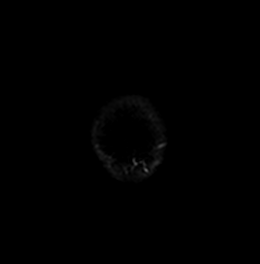
[im 13/76]
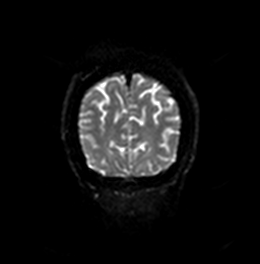
[im 26/76]
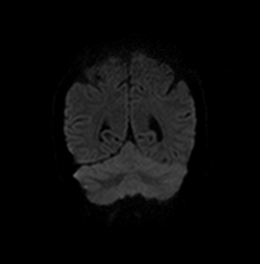
[im 38/76]
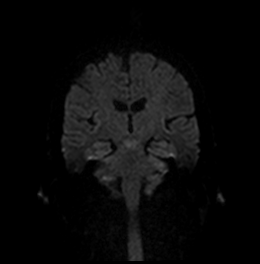
[im 51/76]
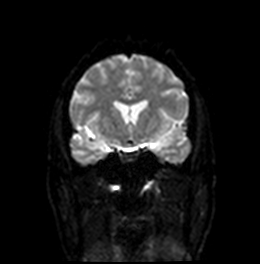
[im 63/76]
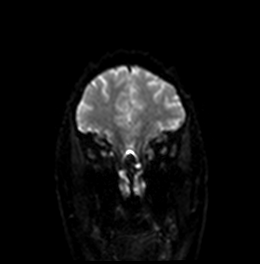
[im 76/76]
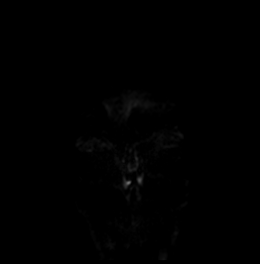

[Series 8: DWI · coronal · 4.0mm · 0.88mm/px · 3 of 38 slices shown (4 of 4)]
[im 1/38]
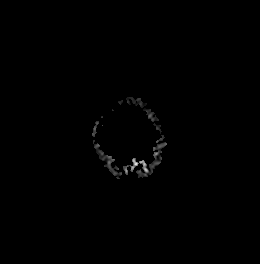
[im 19/38]
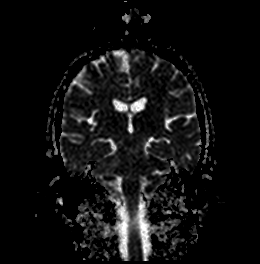
[im 38/38]
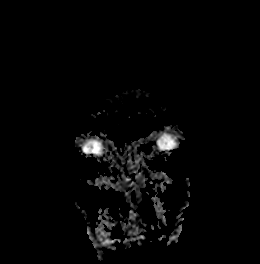

[Series 9: FLAIR · axial · 5.0mm · 0.45mm/px · z∈[-29,+108]mm · 2 of 25 slices shown]
[im 1/25]
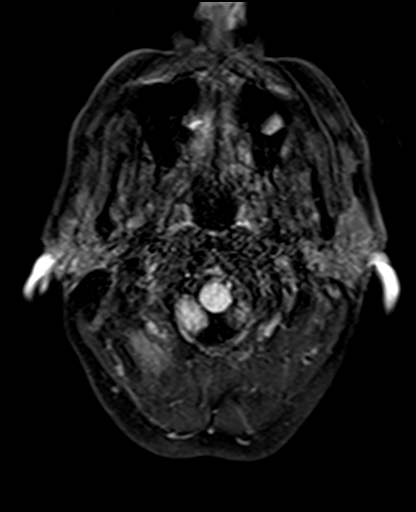
[im 25/25]
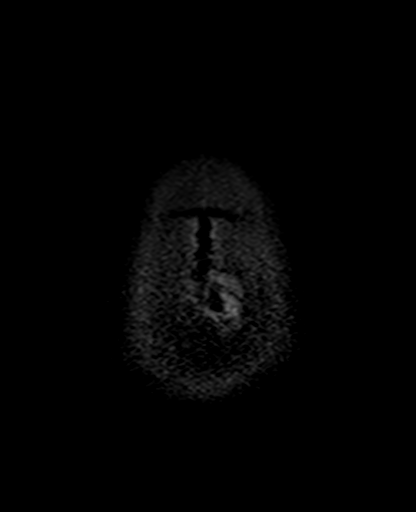

[Series 11: pha_images · axial · 3.0mm · 0.90mm/px · z∈[-39,+107]mm · 5 of 52 slices shown]
[im 1/52]
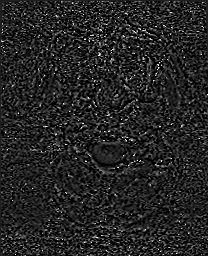
[im 13/52]
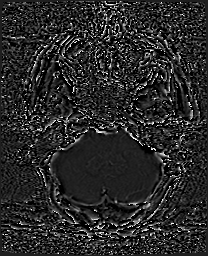
[im 26/52]
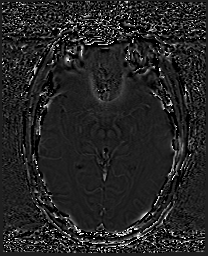
[im 39/52]
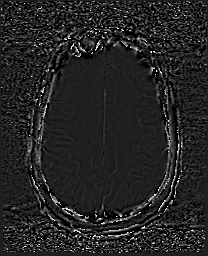
[im 52/52]
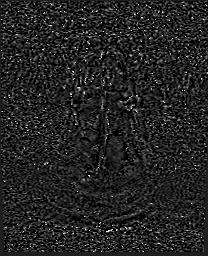

[Series 12: swi_images · axial · 3.0mm · 0.90mm/px · z∈[-39,+118]mm · 5 of 56 slices shown]
[im 1/56]
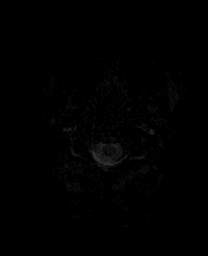
[im 14/56]
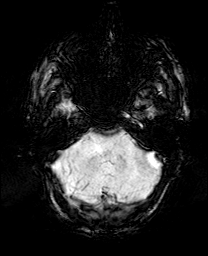
[im 28/56]
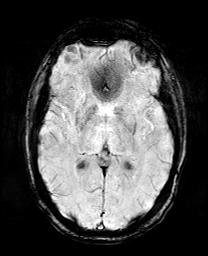
[im 42/56]
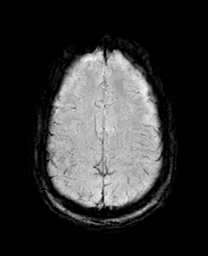
[im 56/56]
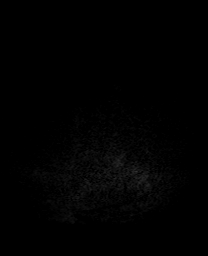

[Series 14: T2 · axial · 5.0mm · 0.72mm/px · z∈[-29,+108]mm · 2 of 25 slices shown (1 of 2)]
[im 1/25]
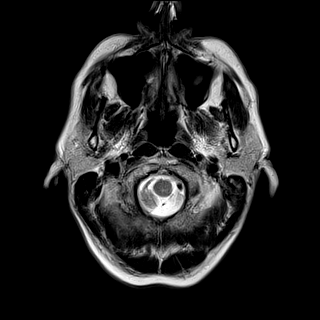
[im 25/25]
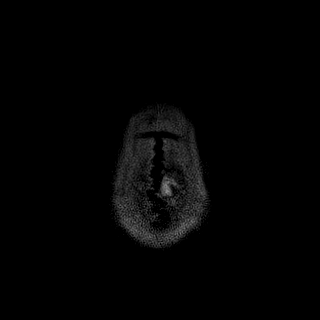

[Series 15: T1 · sagittal · 5.0mm · 0.75mm/px · 2 of 23 slices shown]
[im 1/23]
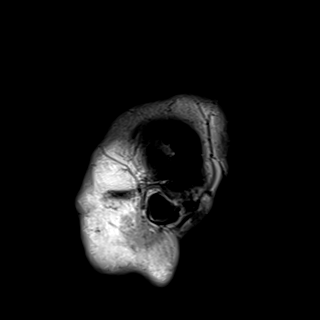
[im 23/23]
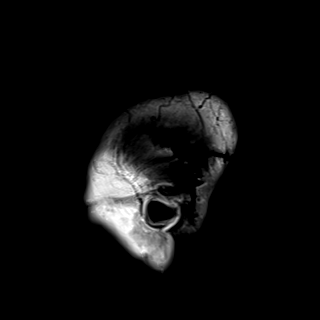

[Series 17: T2 · coronal · 5.0mm · 0.34mm/px · 3 of 30 slices shown (2 of 2)]
[im 1/30]
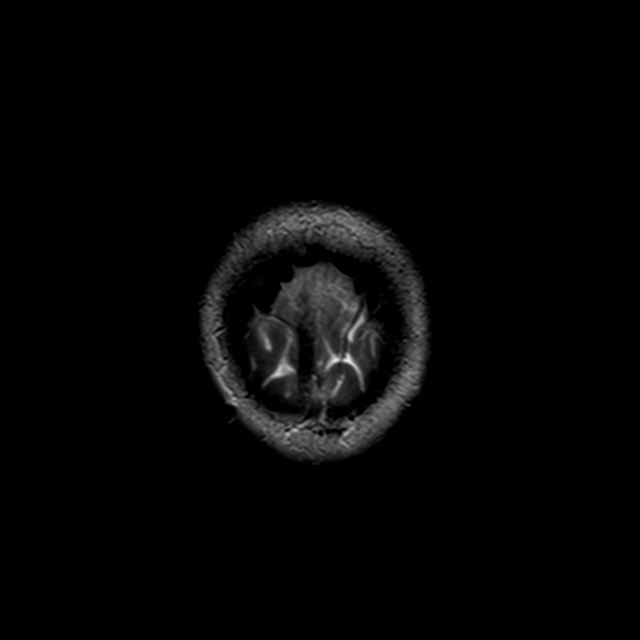
[im 15/30]
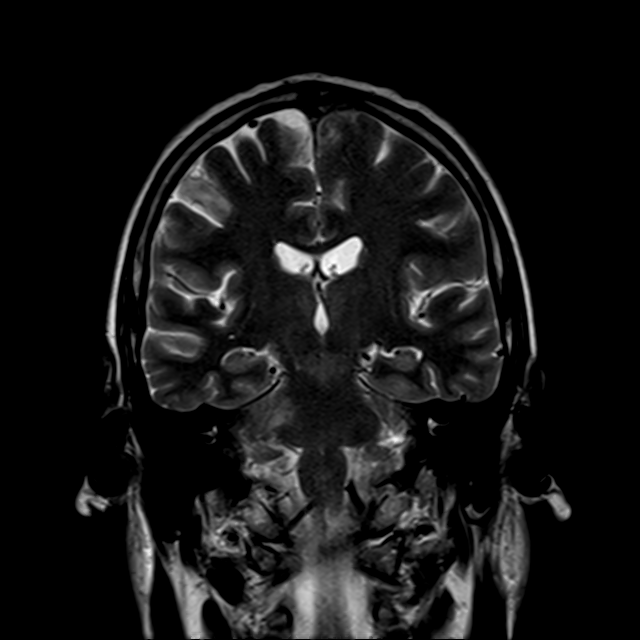
[im 30/30]
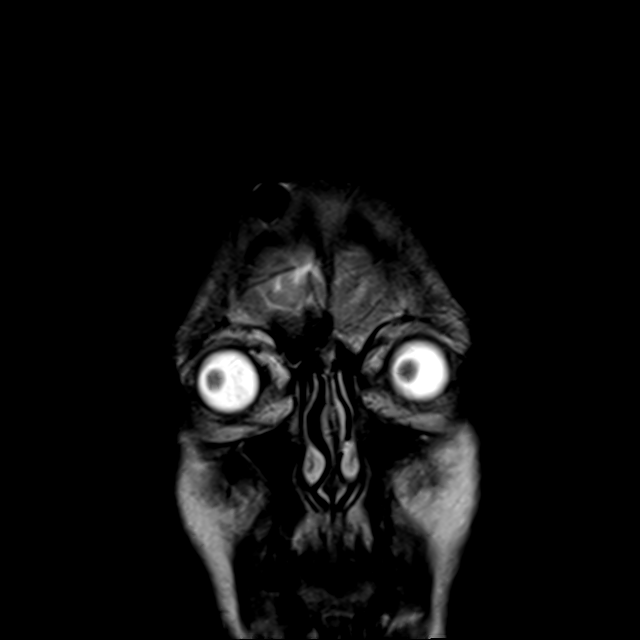

[43 of 48 positions shown; findings below may reference images not displayed]

FINDINGS: MR HEAD FINDINGS

Brain: T2/FLAIR hyperintense foci involving the right middle
cerebellar peduncle ([DATE], 6). No diffusion-weighted signal
abnormality. No intracranial hemorrhage. No midline shift,
ventriculomegaly or extra-axial fluid collection. No mass lesion.

Vascular: Please see CTA head.

Skull and upper cervical spine: Normal marrow signal.

Sinuses/Orbits: Normal orbits. Clear paranasal sinuses. Trace
mastoid effusion.

Other: Right frontal scalp susceptibility artifact.

MR CIRCLE OF WILLIS FINDINGS

Anterior circulation: The bilateral internal carotid, anterior and
middle cerebral arteries are patent and normal caliber. No
significant stenosis, proximal occlusion, aneurysm, or vascular
malformation.

Posterior circulation: Dominant left vertebral artery. Patent
bilateral PICA. Dominant right AICA. Normal caliber patent basilar,
superior cerebellar and posterior cerebral arteries. No significant
stenosis, proximal occlusion, aneurysm, or vascular malformation.

Venous sinuses: No evidence of thrombosis.

Anatomic variants: Bilateral PCOM hypoplasia.

MRA NECK FINDINGS

There is no high-grade narrowing or focal aneurysm involving the
bilateral carotid arteries. No evidence of dissection.

The bilateral vertebral arteries are patent and demonstrate
antegrade flow. Dominant left vertebral artery. No evidence of
high-grade narrowing or focal aneurysm.

Three vessel aortic arch.
IMPRESSION: MRI HEAD: T2/FLAIR hyperintense foci involving the right middle
cerebellar peduncle are suspicious for demyelination. Consider
postcontrast imaging for further evaluation.

MRA HEAD: Normal MRA head.

MRA NECK: Normal MRA neck.

## 2020-04-25 IMAGING — CR DG CHEST 1V PORT
1 series · 1 of 1 positions shown · non-contrast
Comparison: [DATE]

CLINICAL DATA: Weakness.

EXAM:
PORTABLE CHEST 1 VIEW

[AP]
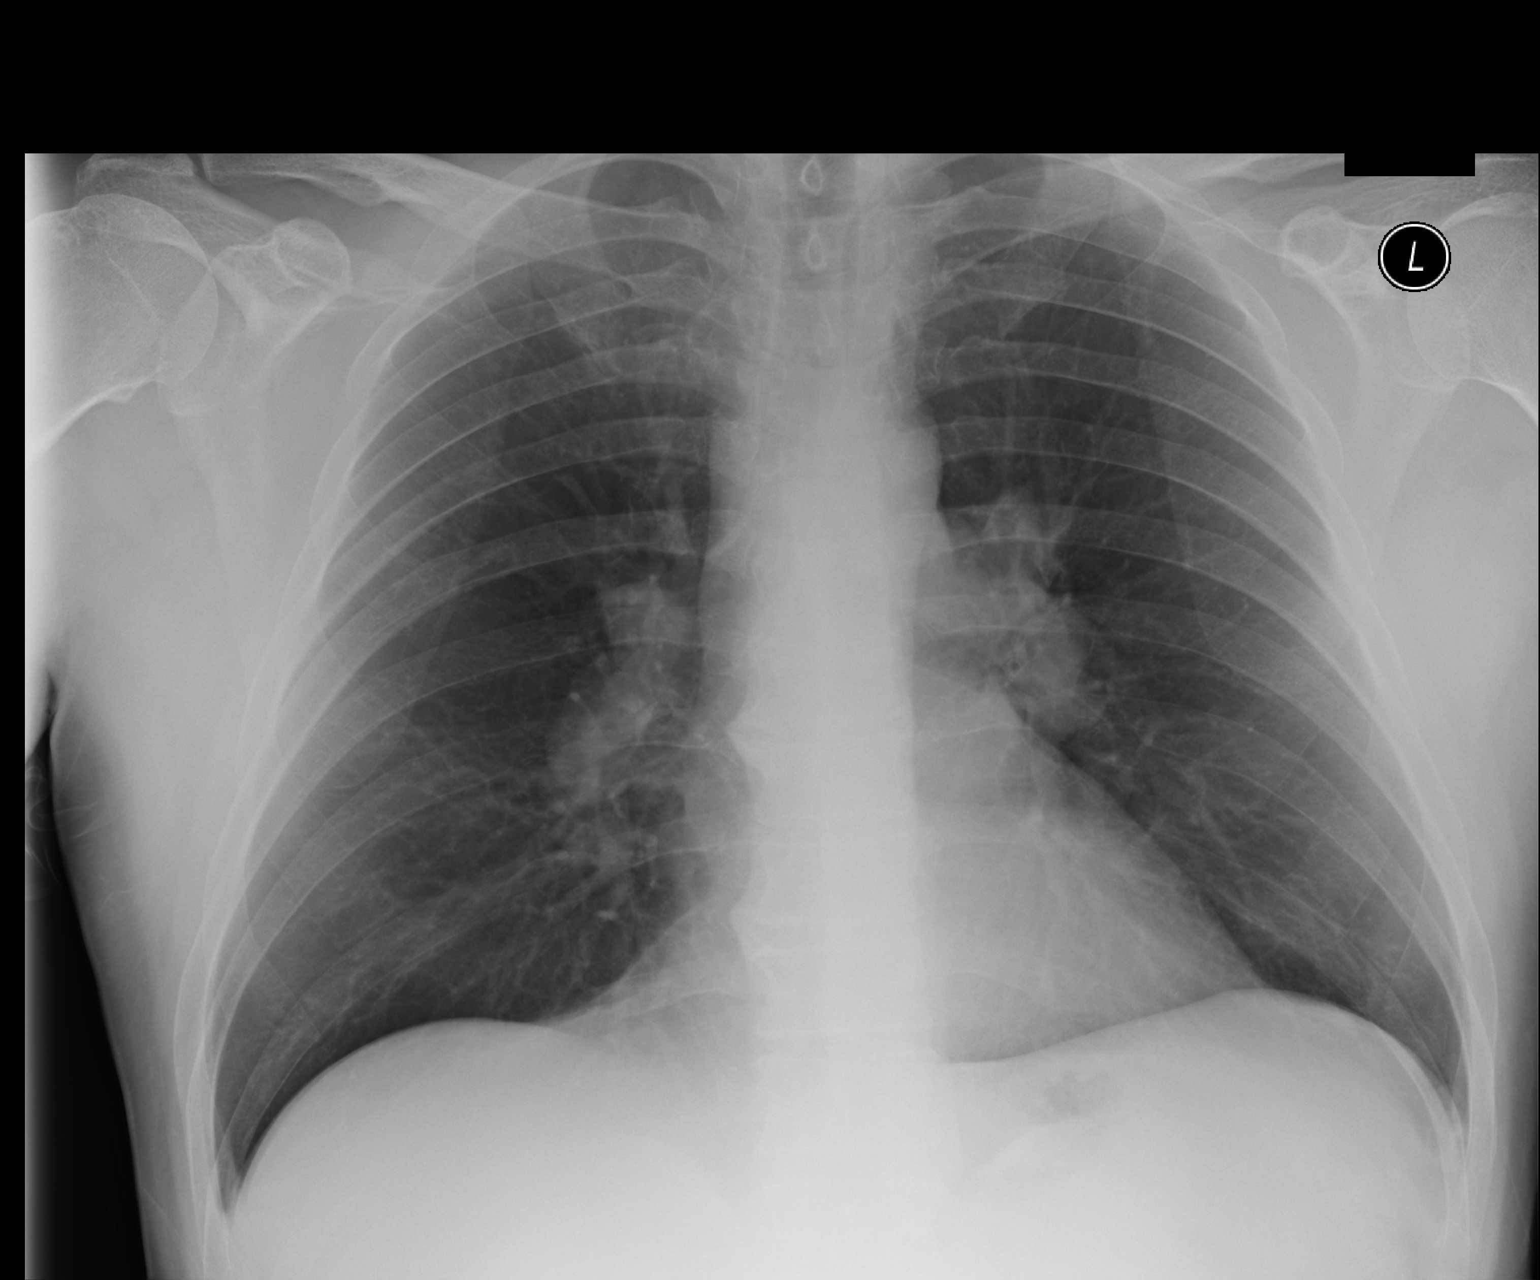

[1 of 1 positions shown; findings below may reference images not displayed]

FINDINGS: The cardiac silhouette is normal. There is bilateral hilar
lymphadenopathy similar to the prior studies

There is no evidence of focal airspace consolidation, pleural
effusion or pneumothorax.

Osseous structures are without acute abnormality. Soft tissues are
grossly normal.
IMPRESSION: Bilateral hilar lymphadenopathy similar to the prior studies.

## 2020-04-25 MED ORDER — DEXAMETHASONE SODIUM PHOSPHATE 10 MG/ML IJ SOLN
10.0000 mg | Freq: Once | INTRAMUSCULAR | Status: AC
  Administered 2020-04-25: 10 mg via INTRAVENOUS
  Filled 2020-04-25: qty 1

## 2020-04-25 MED ORDER — PROCHLORPERAZINE EDISYLATE 10 MG/2ML IJ SOLN
10.0000 mg | Freq: Once | INTRAMUSCULAR | Status: DC
  Filled 2020-04-25: qty 2

## 2020-04-25 MED ORDER — DEXTROSE 5 % IV SOLN
10.0000 mg/kg | Freq: Once | INTRAVENOUS | Status: AC
  Administered 2020-04-26: 860 mg via INTRAVENOUS
  Filled 2020-04-25 (×2): qty 17.2

## 2020-04-25 MED ORDER — SODIUM CHLORIDE 0.9 % IV SOLN
2.0000 g | Freq: Two times a day (BID) | INTRAVENOUS | Status: DC
  Administered 2020-04-26 – 2020-04-27 (×3): 2 g via INTRAVENOUS
  Filled 2020-04-25 (×4): qty 20

## 2020-04-25 MED ORDER — VANCOMYCIN HCL IN DEXTROSE 1-5 GM/200ML-% IV SOLN: 1000.0000 mg | Freq: Once | INTRAVENOUS | Status: DC

## 2020-04-25 MED ORDER — DIPHENHYDRAMINE HCL 50 MG/ML IJ SOLN
25.0000 mg | Freq: Once | INTRAMUSCULAR | Status: AC
  Administered 2020-04-25: 25 mg via INTRAVENOUS
  Filled 2020-04-25: qty 1

## 2020-04-25 MED ORDER — VANCOMYCIN HCL 1750 MG/350ML IV SOLN
1750.0000 mg | Freq: Once | INTRAVENOUS | Status: AC
  Administered 2020-04-26: 1750 mg via INTRAVENOUS
  Filled 2020-04-25: qty 350

## 2020-04-25 MED ORDER — VANCOMYCIN HCL IN DEXTROSE 1-5 GM/200ML-% IV SOLN
1000.0000 mg | Freq: Three times a day (TID) | INTRAVENOUS | Status: DC
  Administered 2020-04-26 – 2020-04-27 (×3): 1000 mg via INTRAVENOUS
  Filled 2020-04-25 (×4): qty 200

## 2020-04-25 MED ORDER — LIDOCAINE HCL 2 % IJ SOLN
10.0000 mL | Freq: Once | INTRAMUSCULAR | Status: AC
  Administered 2020-04-25: 200 mg
  Filled 2020-04-25: qty 20

## 2020-04-25 MED ORDER — METOCLOPRAMIDE HCL 5 MG/ML IJ SOLN
10.0000 mg | Freq: Once | INTRAMUSCULAR | Status: AC
  Administered 2020-04-25: 10 mg via INTRAVENOUS
  Filled 2020-04-25: qty 2

## 2020-04-25 MED ORDER — GADOBUTROL 1 MMOL/ML IV SOLN
8.5000 mL | Freq: Once | INTRAVENOUS | Status: AC | PRN
  Administered 2020-04-25: 8.5 mL via INTRAVENOUS

## 2020-04-25 NOTE — ED Provider Notes (Signed)
.  Lumbar Puncture  Date/Time: 04/25/2020 8:00 PM Performed by: Tilden Fossa, MD Authorized by: Tilden Fossa, MD   Consent:    Consent obtained:  Verbal   Consent given by:  Patient   Risks discussed:  Bleeding, infection, pain, repeat procedure and headache Pre-procedure details:    Procedure purpose:  Diagnostic   Preparation: Patient was prepped and draped in usual sterile fashion   Anesthesia (see MAR for exact dosages):    Anesthesia method:  Local infiltration   Local anesthetic:  Lidocaine 2% w/o epi Procedure details:    Lumbar space:  L4-L5 interspace   Patient position:  Sitting   Needle gauge:  22   Needle length (in):  3.5   Ultrasound guidance: no     Number of attempts:  1   Fluid appearance:  Blood-tinged then clearing   Tubes of fluid:  4   Total volume (ml):  5 Post-procedure:    Puncture site:  Adhesive bandage applied   Patient tolerance of procedure:  Tolerated well, no immediate complications      Tilden Fossa, MD 04/25/20 2000

## 2020-04-25 NOTE — ED Provider Notes (Signed)
44 year old male w/ hx of covid 1 month ago, improved, now presenting with fever x 2 weeks.  Immigrated from [place] approx 1 year ago.  Presenting today with right arm weakness and aphasia transiently last night.  He has neck pain and headache.  Spoke to neurologist who recommended MRI imaging.   Bloodwork otherwise unremarkable, no evidence of sepsis at this time.    Clinical Course as of Apr 26 45  Sat Apr 25, 2020  1415 Reviewed with Dr. Laurence Slate on-call for neurology, recommends going straight to MRI, recommends MRI brain without contrast and MRA head and neck, further recommendations pending MRI   [RD]  1627   IMPRESSION: MRI HEAD: T2/FLAIR hyperintense foci involving the right middle cerebellar peduncle are suspicious for demyelination. Consider postcontrast imaging for further evaluation.   MRA HEAD: Normal MRA head.  MRA NECK: Normal MRA neck.   [MT]  1933 Spoke to Dr Wilford Corner who recommended an additional MRI with contrast, which he has ordered, and a diagnostic LP.  The patient had this explained to him and his wife using a Engineer, structural, particularly our concerns for an infection/encephalitis or posisbly MS.  They are agreebale to both of these tests, and were consented for the LP.  Dr Madilyn Hook EDP has offered to assist with the LP   [MT]  2015 LP successfully performed by Dr Madilyn Hook, labs sent, pending MRI   [MT]  2130 Glucose, CSF(!!): 23 [MT]  2130 Pt reporting headache is "gone completely."  Awaiting CSF results and repeat MRI at 1015 pm   [MT]  2203 WBC elevated, low glucose with elevated protein, added    [MT]  Sun Apr 26, 2020  0003 Initiated BS antibiotics and antivirals for coverage of CSF infection, suspected.  Pt and wife updated and will be admitted   [MT]    Clinical Course User Index [MT] Deliyah Muckle, Kermit Balo, MD [RD] Milagros Loll, MD   Following initial CSF results I spoke with Dr Otelia Limes from neurology who agreed this was consistent with a CSF infection and  agreed with antibiotics, and also advised an ID consult while the patient is in the hospital, particularly given his relatively recent immigration status from [place], and unclear exposure risks there.  Additional labs added by Dr Otelia Limes including HIV, Syphillis and fungal cultures.  We added blood cultures as well.    Terald Sleeper, MD 04/26/20 [phone]

## 2020-04-25 NOTE — ED Provider Notes (Signed)
[facility] EMERGENCY DEPARTMENT Provider Note   CSN: [id number] Arrival date & time: 04/25/20  1045     History No chief complaint on file.   Aaron Chapman is a 44 y.o. male.  Presents to ER with concern for persistent headache, fevers, and episode of arm weakness and difficulty speaking.  See below for detailed summary patient's last 2 months.  In brief he was diagnosed with Covid on August 14, had fevers for around 2 weeks, fevers then went away.  Over the past couple weeks however, he has had return of his fevers.  States he is having daily fevers, last fever was this morning up to 38.5 C.  Took Tylenol this morning.  Reports that he has also been having daily headaches, severe, all over, aching.  Associated with some pain in his upper neck, no stiffness.  Last night around 7 PM he had an episode of difficulty speaking and arm weakness.  He reports that the symptoms went away after about 1 hour.  Has not recurred.  No difficulty speaking currently, no numbness or weakness, no speech changes at present, no vision changes.  Denies any cough.  No rash today.  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   Detailed summary per Mia PA note on 9/21:  The patient has been having intermittent fevers for the last 37 days.  Reports that he was diagnosed with COVID-19 on August 14 and was persistently febrile for approximately 15 days.  At that time, he was also having nonproductive cough and shortness of breath that resolved when he defervesced.  He was then afebrile until around 8 days ago when his fevers returned accompanied by severe headaches, shortness of breath, nonproductive cough, and mild, dull, intermittent chest pain.  T-max 103.1-104 (39.5- 40 C) over the last week.  He has been taking antipyretics every 6 hours.  Last dose of 800 mg of ibuprofen was at 20:00.  He presented to the ER today after he developed a diffuse rash to his trunk and upper and lower extremities.   The rash is not pruritic.  He initially noticed the rash on his bilateral flanks that then spread across his abdomen, chest, back, arms, and legs.  No known sick contacts with a similar rash.  He has had a 4 kg weight loss since he was diagnosed with COVID-19, but attributes this to poor appetite.  Aside from when he has been febrile, he has had no other night sweats.  No hemoptysis, vomiting, neck pain or stiffness, confusion, otalgia, or sore throat.  He also reports that he was previously told that he had inflammation of the colon.  He does not ever recall being diagnosed with diverticulitis.  He does report that he has had some inflammation in his left pelvic region recently.  Denies diarrhea, constipation, arthralgias, melena, hematochezia, dysuria, hematuria, flank pain, penile or testicular pain or swelling.  He has been followed by his PCP for his symptoms.  Reports that he was prescribed a course of amoxicillin after he developed a pneumonia after he was diagnosed with COVID-19.  He completed the course of antibiotics yesterday.  Reports that he also received approximately 6 "steroid shots" to help with inflammation since his diagnosis.  Given persistent fevers, his PCP ordered a CT scan of the chest on 9/10, which demonstrated right lower lobe 4 mm nodule, left lower lobe 3 mm nodule and mediastinal and bilateral hilar lymphadenopathy.  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  He moved to the [place] from [place] in January 2021.  Reports that he received all of his childhood immunizations.  He was also fully vaccinated against COVID-19 after receiving the [employer] vaccine approximately 4 months ago.  He has a history of Zika, dengue, and [place].   The history is provided by the patient and medical records. A language interpreter was used ([place]).  HPI     Past Medical History:  Diagnosis Date  .  COVID-19     There are no problems to display for this patient.   History reviewed. No pertinent surgical history.     No family history on file.  Social History   Tobacco Use  . Smoking status: Never Smoker  . Smokeless tobacco: Never Used  Substance Use Topics  . Alcohol use: Not Currently  . Drug use: Not Currently    Home Medications Prior to Admission medications   Medication Sig Start Date End Date Taking? Authorizing Provider  promethazine-dextromethorphan (PROMETHAZINE-DM) 6.25-15 MG/5ML syrup Take 5 mLs by mouth 4 (four) times daily as needed for cough. 04/22/20   McDonald, Mia A, PA-C    Allergies    Aspirin  Review of Systems   Review of Systems  Constitutional: Positive for chills, fatigue and fever.  HENT: Negative for ear pain and sore throat.   Eyes: Negative for pain and visual disturbance.  Respiratory: Negative for cough and shortness of breath.   Cardiovascular: Negative for chest pain and palpitations.  Gastrointestinal: Negative for abdominal pain and vomiting.  Genitourinary: Negative for dysuria and hematuria.  Musculoskeletal: Positive for arthralgias and neck pain. Negative for back pain.  Skin: Negative for color change and rash.  Neurological: Positive for speech difficulty, weakness and headaches. Negative for seizures and syncope.  All other systems reviewed and are negative.   Physical Exam Updated Vital Signs BP (!) 141/97 (BP Location: Right Arm)   Pulse 80   Temp 98.1 F (36.7 C) (Oral)   Resp 18   Ht 6\' 1"  (1.854 m)   Wt 86.2 kg   SpO2 100%   BMI 25.07 kg/m   Physical Exam Vitals and nursing note reviewed.  Constitutional:      Appearance: He is well-developed.  HENT:     Head: Normocephalic and atraumatic.  Eyes:     Conjunctiva/sclera: Conjunctivae normal.  Cardiovascular:     Rate and Rhythm: Normal rate and regular rhythm.     Heart sounds: No murmur heard.   Pulmonary:     Effort: Pulmonary effort is normal.  No respiratory distress.     Breath sounds: Normal breath sounds.  Abdominal:     Palpations: Abdomen is soft.     Tenderness: There is no abdominal tenderness.  Musculoskeletal:        General: No deformity or signs of injury.     Cervical back: Neck supple.  Skin:    General: Skin is warm and dry.     Coloration: Skin is not jaundiced or pale.     Findings: No bruising, erythema, lesion or rash.  Neurological:     Mental Status: He is alert.     Comments: AAOx3 CN 2-12 intact, speech clear visual fields intact 5/5 strength in b/l UE and LE Sensation to light touch intact in b/l UE and LE Normal FNF      ED Results / Procedures / Treatments   Labs (all labs ordered are  listed, but only abnormal results are displayed) Labs Reviewed  COMPREHENSIVE METABOLIC PANEL - Abnormal; Notable for the following components:      Result Value   Glucose, Bld 104 (*)    Alkaline Phosphatase 35 (*)    All other components within normal limits  LACTIC ACID, PLASMA  LACTIC ACID, PLASMA  CBC WITH DIFFERENTIAL/PLATELET  URINALYSIS, ROUTINE W REFLEX MICROSCOPIC  QUANTIFERON-TB GOLD PLUS    EKG None  Radiology DG Chest Portable 1 View  Result Date: 04/25/2020 CLINICAL DATA:  Weakness. EXAM: PORTABLE CHEST 1 VIEW COMPARISON:  April 22, 2020 FINDINGS: The cardiac silhouette is normal. There is bilateral hilar lymphadenopathy similar to the prior studies There is no evidence of focal airspace consolidation, pleural effusion or pneumothorax. Osseous structures are without acute abnormality. Soft tissues are grossly normal. IMPRESSION: Bilateral hilar lymphadenopathy similar to the prior studies. Electronically Signed   By: Ted Mcalpine M.D.   On: 04/25/2020 12:14    Procedures Procedures (including critical care time)  Medications Ordered in ED Medications - No data to display  ED Course  I have reviewed the triage vital signs and the nursing notes.  Pertinent labs & imaging  results that were available during my care of the patient were reviewed by me and considered in my medical decision making (see chart for details).  Clinical Course as of Apr 25 1438  Sat Apr 25, 2020  1415 Reviewed with Dr. Laurence Slate on-call for neurology, recommends going straight to MRI, recommends MRI brain without contrast and MRA head and neck, further recommendations pending MRI   [RD]    Clinical Course User Index [RD] Milagros Loll, MD   MDM Rules/Calculators/A&P                          44 year old male presenting to the ER with concern for ongoing fevers, headache, neck pain as well as episode of difficulty speaking and arm weakness.  Currently patient has a normal neurologic exam and is well-appearing.  Prior work-up included CT chest with contrast that demonstrated mediastinal and hilar lymphadenopathy, CXR today negative for pneumonia, similar adenopathy noted.  No abdominal symptoms, soft abdomen on exam.  Denies dysuria, will check UA.  Prior EDP had ordered quanterferon gold but the test was not run, will resend today though I have a low clinical suspicion for this dx.   Given the neurologic symptoms, reviewed case with neurology.  Recommends obtaining MR imaging as above to further assess.    While awaiting MRI, patient signed out to Dr. Renaye Rakers.    Final Clinical Impression(s) / ED Diagnoses Final diagnoses:  Fever, unspecified fever cause  History of COVID-19  Transient neurologic deficit  Transient speech disturbance    Rx / DC Orders ED Discharge Orders    None       Milagros Loll, MD 04/25/20 1439

## 2020-04-25 NOTE — ED Notes (Signed)
Headache still 

## 2020-04-25 NOTE — ED Notes (Signed)
Procedure permit  Signed by the pt

## 2020-04-25 NOTE — ED Notes (Signed)
The pt has had redness around his head and neck  Since he received  The reglan vitals good

## 2020-04-25 NOTE — ED Triage Notes (Signed)
Pt/translator stated, I have a headache with back of head . Yesterday my tongue was rolled. A few days ago I had difficulty breathing. Mostly cause I have a fever and headache. Thjis started one week ago. Had COVID vaccine.  Had COVID at the end of August late.

## 2020-04-25 NOTE — Progress Notes (Signed)
Pharmacy Antibiotic Note  Aaron Chapman is a 44 y.o. male admitted on 04/25/2020 with meningitis.  Pharmacy has been consulted for vancomycin dosing.  Presented with 2-3 week hx of worsening head/neck pain, persistent fevers, and weakness. WBC 4.5, LA 1.5, afebrile. Scr 0.78 (CrCl >100 mL/min). CSF still pending but glucose 23, WBC 106, total protein 357.   Plan: Ceftriaxone 2g IV every 12 hours Vancomycin 1750 mg IV once then 1000 mg IV every 8 hours  Monitor renal fx, cx results, clinical pic, and vanc levels as appropraite  Height: 6\' 1"  (185.4 cm) Weight: 86.2 kg (190 lb) IBW/kg (Calculated) : 79.9  Temp (24hrs), Avg:97.9 F (36.6 C), Min:97.8 F (36.6 C), Max:98.1 F (36.7 C)  Recent Labs  Lab 04/21/20 1416 04/25/20 1118 04/25/20 1335  WBC 8.0 4.5  --   CREATININE 0.83 0.78  --   LATICACIDVEN 1.4 1.4 1.5    Estimated Creatinine Clearance: 130.4 mL/min (by C-G formula based on SCr of 0.78 mg/dL).    Allergies  Allergen Reactions  . Aspirin Swelling    Facial swelling    Antimicrobials this admission: Vancomycin 9/25 >>  Ceftriaxone 9/25 >>   Dose adjustments this admission: N/A  Microbiology results: 9/25 CSF Cx: pending  Thank you for allowing pharmacy to be a part of this patient's care.  10/25, PharmD, BCCCP Clinical Pharmacist  Phone: [phone] 04/25/2020 10:25 PM  Please check AMION for all [facility] phone numbers After 10:00 PM, call Main Pharmacy [phone]

## 2020-04-25 NOTE — ED Notes (Signed)
lp performed by dr Madilyn Hook  specimena collected and taken to the lab

## 2020-04-25 NOTE — ED Notes (Signed)
Patient transported to CT 

## 2020-04-25 NOTE — Consult Note (Addendum)
NEURO HOSPITALIST CONSULT NOTE   Requesting physician: Dr. Renaye Rakers  Reason for Consult: Weakness, abnormal right MCP T2 hyperintensity on MRI  History obtained from: Patient and Chart   HPI:                                                                                                                                          Aaron Chapman is an 44 y.o. male with a past medical history of COVID-19 and trigeminal neuralgia (dx and surgery in [place] 2015, has had chronic decreased sensation to right side of face and RLE since the surgery) who presents with a 2-to-3-week history of worsening head/neck pain, persistent fevers and weakness. Yesterday evening, Aaron Chapman had an episode of difficulty speaking and arm weakness that lasted approximately one hour.  On August 14-15, Aaron Chapman was diagnosed with COVID-19. He had a fever for approximately 2 weeks from diagnosis. Shortly after his fever resolved, it returned with accompanied worsening head and neck pain that extends through the upper back as well as weakness in the extremities. Spouse notes that he is "wobbly" from the weakness, but he has not fallen.  Spouse present at bedside.  Spanish interpreter was used throughout exam.  Pertinent Medications Dexamethasone  x 1 Benadryl  x 1  Pertinent Imaging/Diagnostics MRI Brain 25Sep2021: T2/FLAIR hyperintense foci involving the right middle cerebellar peduncle are suspicious for demyelination. MRA head/neck 25Sep2021: Normal  CSF labs:    MR Brain, C-spine with contrast: Pending   Past Medical History:  Diagnosis Date  . COVID-19     History reviewed. No pertinent surgical history.  No family history on file.  Social History:  reports that he has never smoked. He has never used smokeless tobacco. He reports previous alcohol use. He reports previous drug use.  Allergies  Allergen Reactions  . Aspirin     OUTPATIENT MEDICATIONS:                                                                                                                    No outpatient medications have been marked as taking for the 04/25/20 encounter [facility] Encounter).   No current facility-administered medications on file prior to encounter.   Current Outpatient Medications on File Prior to Encounter  Medication Sig Dispense Refill  .  Ascorbic Acid (VITAMIN C) 1000 MG tablet Take 1,000 mg by mouth daily.    Marland Kitchen ibuprofen (ADVIL) 800 MG tablet Take 800 mg by mouth every 8 (eight) hours as needed for fever.    . promethazine-dextromethorphan (PROMETHAZINE-DM) 6.25-15 MG/5ML syrup Take 5 mLs by mouth 4 (four) times daily as needed for cough. (Patient taking differently: Take 5 mLs by mouth 2 (two) times daily as needed for cough. ) 120 mL 0  . VITAMIN A PO Take 1 capsule by mouth daily.    Marland Kitchen amoxicillin-clavulanate (AUGMENTIN) 875-125 MG tablet Take 1 tablet by mouth 2 (two) times daily.    Marland Kitchen dexamethasone (DECADRON) 4 MG tablet Take 4 mg by mouth daily.     Inpatient Medications: . prochlorperazine  10 mg Intravenous Once   . acyclovir    . cefTRIAXone (ROCEPHIN)  IV    . vancomycin    . vancomycin        Review Of Systems:                                                                                                           History obtained from chart review and the patient  General: As noted in HPI Psychological: Negative for any known behavioral disorder Ophthalmic: Negative for blurry or double vision, loss of vision in any field or eye pain ENT: Negative for abrupt loss of hearing or vertigo Respiratory: Negative for cough, shortness of breath Cardiovascular: Negative for chest pain or irregular heartbeat Gastrointestinal: Negative for abdominal pain, nausea/vomiting Musculoskeletal: Muscular weakness Neurological: As noted in HPI Dermatological: Negative for numbness or tingling  Blood pressure 127/89, pulse 78, temperature 98.1 F (36.7  C), temperature source Oral, resp. rate 19, height 6\' 1"  (1.854 m), weight 86.2 kg, SpO2 99 %.   Physical Examination:                                                                                                      General: WDWN male. Appears uncomfortable seated in chair at bedside.  HEENT:  Normocephalic, no lesions, without obvious abnormality.  Normal external eyes, ears, nose. Nuchal rigidity is noted.  Cardiovascular: No edema, pulses palpable throughout   Pulmonary: Occasional heavy breathing on room air. Does not endorse SOB or air hunger; breathing from pain. Abdomen: soft, non-tender Musculoskeletal: no joint tenderness, deformity or swelling. Tone and bulk normal throughout; no atrophy noted  Neurological Examination:  Mental Status:  Alert, oriented x 4, thought content appropriate.  Pleasant and cooperative. Speech fluent in Spanish and non-dysarthric without evidence of aphasia. Spouse states that his voice is not as strong as pre-illness. Able to follow 3-step commands without difficulty.  Cranial Nerves: II: Visual fields grossly normal, PERRL III,IV, VI: Ptosis not present. EOMI. Lateral end-gaze nystagmus present. V,VII: Smile and eyebrow raise is symmetric. Facial light touch and cool temp sensation intact bilaterally VIII: Hearing grossly intact IX,X: Palate rises symmetrically XI: SCM and bilateral shoulder shrug strength 4/5. Pain and stiffness present in neck with movement in all directions. XII: Midline tongue extension Motor: Right :     Upper extremity   4/5   Left:     Upper extremity   4/5          Lower extremity   4/5     Lower extremity   4/5 Pronator drift not present Sensory: Light touch intact throughout, bilaterally. Temp sensation intact x 4.  Deep Tendon Reflexes: 1+ and symmetric throughout uppers and LLE, 2+ in RLE Gait: Deferred   Lab Results: Basic  Metabolic Panel: Recent Labs  Lab 04/21/20 1416 04/25/20 1118  NA 134* 137  K 4.1 4.7  CL 99 103  CO2 25 26  GLUCOSE 109* 104*  BUN 11 6  CREATININE 0.83 0.78  CALCIUM 9.0 9.4    Liver Function Tests: Recent Labs  Lab 04/21/20 1416 04/25/20 1118  AST 17 18  ALT 27 28  ALKPHOS 36* 35*  BILITOT 0.9 0.9  PROT 6.8 7.0  ALBUMIN 3.7 3.8   No results for input(s): LIPASE, AMYLASE in the last 168 hours. No results for input(s): AMMONIA in the last 168 hours.  CBC: Recent Labs  Lab 04/21/20 1416 04/25/20 1118  WBC 8.0 4.5  NEUTROABS 6.2 2.9  HGB 14.3 15.1  HCT 44.2 44.8  MCV 92.7 93.1  PLT 196 200    Cardiac Enzymes: No results for input(s): CKTOTAL, CKMB, CKMBINDEX, TROPONINI in the last 168 hours.  Lipid Panel: No results for input(s): CHOL, TRIG, HDL, CHOLHDL, VLDL, LDLCALC in the last 168 hours.  CBG: No results for input(s): GLUCAP in the last 168 hours.  Microbiology: Results for orders placed or performed during the hospital encounter of 04/21/20  Blood culture (routine x 2)     Status: None (Preliminary result)   Collection Time: 04/22/20  2:10 AM   Specimen: BLOOD RIGHT HAND  Result Value Ref Range Status   Specimen Description BLOOD RIGHT HAND  Final   Special Requests   Final    BOTTLES DRAWN AEROBIC AND ANAEROBIC Blood Culture adequate volume   Culture   Final    NO GROWTH 3 DAYS Performed at [facility], [address] [place], [place] [address]    Report Status PENDING  Incomplete  Blood culture (routine x 2)     Status: None (Preliminary result)   Collection Time: 04/22/20  2:20 AM   Specimen: BLOOD LEFT ARM  Result Value Ref Range Status   Specimen Description BLOOD LEFT ARM  Final   Special Requests   Final    BOTTLES DRAWN AEROBIC AND ANAEROBIC Blood Culture adequate volume   Culture   Final    NO GROWTH 3 DAYS Performed at [facility], [address] [place], [place] [address]    Report Status PENDING  Incomplete     Coagulation Studies: No results for input(s): LABPROT, INR in the last 72 hours.  Imaging: MR ANGIO HEAD WO CONTRAST  Result Date: 04/25/2020 CLINICAL DATA:  Neuro deficit, transient right arm weakness with aphasia. EXAM: MR HEAD WITHOUT CONTRAST MR CIRCLE OF WILLIS WITHOUT CONTRAST MRA OF THE NECK WITHOUT AND WITH CONTRAST TECHNIQUE: Multiplanar, multiecho pulse sequences of the brain, circle of willis and surrounding structures were obtained without intravenous contrast. Angiographic images of the neck were obtained using MRA technique without and with intravenous contrast. CONTRAST:  8.845mL GADAVIST GADOBUTROL 1 MMOL/ML IV SOLN COMPARISON:  None. FINDINGS: MR HEAD FINDINGS Brain: T2/FLAIR hyperintense foci involving the right middle cerebellar peduncle (9:7, 6). No diffusion-weighted signal abnormality. No intracranial hemorrhage. No midline shift, ventriculomegaly or extra-axial fluid collection. No mass lesion. Vascular: Please see CTA head. Skull and upper cervical spine: Normal marrow signal. Sinuses/Orbits: Normal orbits. Clear paranasal sinuses. Trace mastoid effusion. Other: Right frontal scalp susceptibility artifact. MR CIRCLE OF WILLIS FINDINGS Anterior circulation: The bilateral internal carotid, anterior and middle cerebral arteries are patent and normal caliber. No significant stenosis, proximal occlusion, aneurysm, or vascular malformation. Posterior circulation: Dominant left vertebral artery. Patent bilateral PICA. Dominant right AICA. Normal caliber patent basilar, superior cerebellar and posterior cerebral arteries. No significant stenosis, proximal occlusion, aneurysm, or vascular malformation. Venous sinuses: No evidence of thrombosis. Anatomic variants: Bilateral PCOM hypoplasia. MRA NECK FINDINGS There is no high-grade narrowing or focal aneurysm involving the bilateral carotid arteries. No evidence of dissection. The bilateral vertebral arteries are patent and demonstrate  antegrade flow. Dominant left vertebral artery. No evidence of high-grade narrowing or focal aneurysm. Three vessel aortic arch. IMPRESSION: MRI HEAD: T2/FLAIR hyperintense foci involving the right middle cerebellar peduncle are suspicious for demyelination. Consider postcontrast imaging for further evaluation. MRA HEAD: Normal MRA head. MRA NECK: Normal MRA neck. Electronically Signed   By: Stana Buntinghikanele  Emekauwa M.D.   On: 04/25/2020 16:10   MR Angiogram Neck W or Wo Contrast  Result Date: 04/25/2020 CLINICAL DATA:  Neuro deficit, transient right arm weakness with aphasia. EXAM: MR HEAD WITHOUT CONTRAST MR CIRCLE OF WILLIS WITHOUT CONTRAST MRA OF THE NECK WITHOUT AND WITH CONTRAST TECHNIQUE: Multiplanar, multiecho pulse sequences of the brain, circle of willis and surrounding structures were obtained without intravenous contrast. Angiographic images of the neck were obtained using MRA technique without and with intravenous contrast. CONTRAST:  8.715mL GADAVIST GADOBUTROL 1 MMOL/ML IV SOLN COMPARISON:  None. FINDINGS: MR HEAD FINDINGS Brain: T2/FLAIR hyperintense foci involving the right middle cerebellar peduncle (9:7, 6). No diffusion-weighted signal abnormality. No intracranial hemorrhage. No midline shift, ventriculomegaly or extra-axial fluid collection. No mass lesion. Vascular: Please see CTA head. Skull and upper cervical spine: Normal marrow signal. Sinuses/Orbits: Normal orbits. Clear paranasal sinuses. Trace mastoid effusion. Other: Right frontal scalp susceptibility artifact. MR CIRCLE OF WILLIS FINDINGS Anterior circulation: The bilateral internal carotid, anterior and middle cerebral arteries are patent and normal caliber. No significant stenosis, proximal occlusion, aneurysm, or vascular malformation. Posterior circulation: Dominant left vertebral artery. Patent bilateral PICA. Dominant right AICA. Normal caliber patent basilar, superior cerebellar and posterior cerebral arteries. No significant  stenosis, proximal occlusion, aneurysm, or vascular malformation. Venous sinuses: No evidence of thrombosis. Anatomic variants: Bilateral PCOM hypoplasia. MRA NECK FINDINGS There is no high-grade narrowing or focal aneurysm involving the bilateral carotid arteries. No evidence of dissection. The bilateral vertebral arteries are patent and demonstrate antegrade flow. Dominant left vertebral artery. No evidence of high-grade narrowing or focal aneurysm. Three vessel aortic arch. IMPRESSION: MRI HEAD: T2/FLAIR hyperintense foci involving the right middle cerebellar peduncle are suspicious for demyelination. Consider  postcontrast imaging for further evaluation. MRA HEAD: Normal MRA head. MRA NECK: Normal MRA neck. Electronically Signed   By: Stana Bunting M.D.   On: 04/25/2020 16:10   MR BRAIN WO CONTRAST  Result Date: 04/25/2020 CLINICAL DATA:  Neuro deficit, transient right arm weakness with aphasia. EXAM: MR HEAD WITHOUT CONTRAST MR CIRCLE OF WILLIS WITHOUT CONTRAST MRA OF THE NECK WITHOUT AND WITH CONTRAST TECHNIQUE: Multiplanar, multiecho pulse sequences of the brain, circle of willis and surrounding structures were obtained without intravenous contrast. Angiographic images of the neck were obtained using MRA technique without and with intravenous contrast. CONTRAST:  8.17mL GADAVIST GADOBUTROL 1 MMOL/ML IV SOLN COMPARISON:  None. FINDINGS: MR HEAD FINDINGS Brain: T2/FLAIR hyperintense foci involving the right middle cerebellar peduncle (9:7, 6). No diffusion-weighted signal abnormality. No intracranial hemorrhage. No midline shift, ventriculomegaly or extra-axial fluid collection. No mass lesion. Vascular: Please see CTA head. Skull and upper cervical spine: Normal marrow signal. Sinuses/Orbits: Normal orbits. Clear paranasal sinuses. Trace mastoid effusion. Other: Right frontal scalp susceptibility artifact. MR CIRCLE OF WILLIS FINDINGS Anterior circulation: The bilateral internal carotid, anterior and  middle cerebral arteries are patent and normal caliber. No significant stenosis, proximal occlusion, aneurysm, or vascular malformation. Posterior circulation: Dominant left vertebral artery. Patent bilateral PICA. Dominant right AICA. Normal caliber patent basilar, superior cerebellar and posterior cerebral arteries. No significant stenosis, proximal occlusion, aneurysm, or vascular malformation. Venous sinuses: No evidence of thrombosis. Anatomic variants: Bilateral PCOM hypoplasia. MRA NECK FINDINGS There is no high-grade narrowing or focal aneurysm involving the bilateral carotid arteries. No evidence of dissection. The bilateral vertebral arteries are patent and demonstrate antegrade flow. Dominant left vertebral artery. No evidence of high-grade narrowing or focal aneurysm. Three vessel aortic arch. IMPRESSION: MRI HEAD: T2/FLAIR hyperintense foci involving the right middle cerebellar peduncle are suspicious for demyelination. Consider postcontrast imaging for further evaluation. MRA HEAD: Normal MRA head. MRA NECK: Normal MRA neck. Electronically Signed   By: Stana Bunting M.D.   On: 04/25/2020 16:10   DG Chest Portable 1 View  Result Date: 04/25/2020 CLINICAL DATA:  Weakness. EXAM: PORTABLE CHEST 1 VIEW COMPARISON:  April 22, 2020 FINDINGS: The cardiac silhouette is normal. There is bilateral hilar lymphadenopathy similar to the prior studies There is no evidence of focal airspace consolidation, pleural effusion or pneumothorax. Osseous structures are without acute abnormality. Soft tissues are grossly normal. IMPRESSION: Bilateral hilar lymphadenopathy similar to the prior studies. Electronically Signed   By: Ted Mcalpine M.D.   On: 04/25/2020 12:14    Aaron Shines, PhD, PA-C [facility]  04/25/2020, 6:18 PM  Assessment: 44 year old male with a past medical history of recent COVID-19 infection in August and trigeminal neuralgia who presents with a 2-to-3-week  history of worsening head/neck pain, persistent fevers and weakness. Yesterday evening, the patient had an episode of difficulty speaking and arm weakness that lasted approximately one hour. Of note, on August 14-15, he was diagnosed with COVID-19. He had a fever for approximately 2 weeks from diagnosis. Shortly after his fever resolved, it returned with accompanied worsening head and neck pain that extends through the upper back as well as weakness in the extremities. Spouse notes that he is "wobbly" from the weakness, but he has not fallen. 1. Exam reveals nuchal rigidity and diffuse 4/5 weakness. Asymmetric reflexes in lower extremities also noted, brisker in RLE. 2. MRI brain: T2/FLAIR hyperintense foci involving the right middle cerebellar peduncle are suspicious for demyelination. Consider postcontrast imaging for further evaluation.  3. MRA of head and neck:  Normal  4. CSF: Glucose markedly decreased at 23, WBC markedly increased at 106 (lymphocytic predominant) and protein severely elevated at 357.  5. 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 The lymphocytic predominance suggests an indolent bacterial or fungal infection, such as TB, syphilis, cryptococcus or other unusual bacterial/fungal pathogen. Less likely to be one of the more common bacterial pathogens due to lack of neutrophils in CSF WBCs. An unusual presentation of a viral meningoencephalitis is possible but unlikely. An autoimmune process is on the DDx but also felt to be unlikely. Carcinomatous meningitis is also possible and cytology has been added to the CSF labs. Covid meningoencephalitis should also be considered given proximity in time from the start of his meningeal symptoms relative to his recent Covid infection; however, the WBC and protein in our patient are significantly higher than those found in case reports in the literature, and his severely low CSF glucose is more consistent with a bacterial  or fungal infection.   Recommendations: 1. Agree with starting acyclovir, ceftriaxone and vancomycin.  2. ID consult 3. VDRL, AFB smear, cryptococcal antigen, fungal culture and cytology have been added to CSF studies.  4. Add-on MRI of brain study with contrast is pending.  5. DVT prophylaxis with Lovenox  I have Chapman and examined the patient. I have formulated the assessment and plan. 44 year old male with a past medical history of recent COVID-19 infection who presents with a 2-to-3-week history of worsening head/neck pain, persistent fevers and weakness, as well as an episode on the day PTA of difficulty speaking and arm weakness that lasted approximately one hour. Exam reveals nuchal rigidity. 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 Agree with starting acyclovir, ceftriaxone and vancomycin. ID consult. VDRL, AFB smear, cryptococcal antigen, fungal culture and cytology have been added to CSF studies. Add-on MRI of brain study with contrast is pending.  Electronically signed: Dr. Caryl Pina

## 2020-04-25 NOTE — ED Notes (Signed)
Pt still in mri  Will start antibiotics whenever he  rerturns

## 2020-04-25 NOTE — ED Notes (Signed)
Med given 

## 2020-04-25 NOTE — ED Notes (Signed)
Patient transported to MRI 

## 2020-04-26 DIAGNOSIS — G039 Meningitis, unspecified: Secondary | ICD-10-CM | POA: Diagnosis present

## 2020-04-26 DIAGNOSIS — R509 Fever, unspecified: Secondary | ICD-10-CM | POA: Diagnosis present

## 2020-04-26 DIAGNOSIS — R519 Headache, unspecified: Secondary | ICD-10-CM | POA: Diagnosis present

## 2020-04-26 LAB — CBC
HCT: 41.2 % (ref 39.0–52.0)
Hemoglobin: 13.8 g/dL (ref 13.0–17.0)
MCH: 30.6 pg (ref 26.0–34.0)
MCHC: 33.5 g/dL (ref 30.0–36.0)
MCV: 91.4 fL (ref 80.0–100.0)
Platelets: 210 10*3/uL (ref 150–400)
RBC: 4.51 MIL/uL (ref 4.22–5.81)
RDW: 12.4 % (ref 11.5–15.5)
WBC: 7.5 10*3/uL (ref 4.0–10.5)
nRBC: 0 % (ref 0.0–0.2)

## 2020-04-26 LAB — COMPREHENSIVE METABOLIC PANEL
ALT: 25 U/L (ref 0–44)
AST: 15 U/L (ref 15–41)
Albumin: 3.4 g/dL — ABNORMAL LOW (ref 3.5–5.0)
Alkaline Phosphatase: 38 U/L (ref 38–126)
Anion gap: 15 (ref 5–15)
BUN: 10 mg/dL (ref 6–20)
CO2: 23 mmol/L (ref 22–32)
Calcium: 9.1 mg/dL (ref 8.9–10.3)
Chloride: 99 mmol/L (ref 98–111)
Creatinine, Ser: 0.77 mg/dL (ref 0.61–1.24)
GFR calc Af Amer: >60 mL/min (ref >60)
GFR calc non Af Amer: >60 mL/min (ref >60)
Glucose, Bld: 131 mg/dL — ABNORMAL HIGH (ref 70–99)
Potassium: 4.4 mmol/L (ref 3.5–5.1)
Sodium: 137 mmol/L (ref 135–145)
Total Bilirubin: 1.7 mg/dL — ABNORMAL HIGH (ref 0.3–1.2)
Total Protein: 6.4 g/dL — ABNORMAL LOW (ref 6.5–8.1)

## 2020-04-26 LAB — RAPID HIV SCREEN (HIV 1/2 AB+AG)
HIV 1/2 Antibodies: NONREACTIVE
HIV-1 P24 Antigen - HIV24: NONREACTIVE

## 2020-04-26 LAB — CRYPTOCOCCAL ANTIGEN, CSF: Crypto Ag: NEGATIVE

## 2020-04-26 MED ORDER — ISONIAZID 300 MG PO TABS
300.0000 mg | ORAL_TABLET | Freq: Every day | ORAL | Status: DC
  Administered 2020-04-26 – 2020-05-01 (×5): 300 mg via ORAL
  Filled 2020-04-26 (×6): qty 1

## 2020-04-26 MED ORDER — ONDANSETRON HCL 4 MG PO TABS: 4.0000 mg | ORAL_TABLET | Freq: Four times a day (QID) | ORAL | Status: DC | PRN

## 2020-04-26 MED ORDER — ONDANSETRON HCL 4 MG/2ML IJ SOLN: 4.0000 mg | Freq: Four times a day (QID) | INTRAMUSCULAR | Status: DC | PRN

## 2020-04-26 MED ORDER — HEPARIN SODIUM (PORCINE) 5000 UNIT/ML IJ SOLN
5000.0000 [IU] | Freq: Three times a day (TID) | INTRAMUSCULAR | Status: DC
  Administered 2020-04-26 – 2020-05-05 (×22): 5000 [IU] via SUBCUTANEOUS
  Filled 2020-04-26 (×21): qty 1

## 2020-04-26 MED ORDER — PYRAZINAMIDE 500 MG PO TABS
2000.0000 mg | ORAL_TABLET | Freq: Every day | ORAL | Status: DC
  Administered 2020-04-26 – 2020-05-01 (×5): 2000 mg via ORAL
  Filled 2020-04-26 (×6): qty 4

## 2020-04-26 MED ORDER — VITAMIN B-6 50 MG PO TABS
50.0000 mg | ORAL_TABLET | Freq: Every day | ORAL | Status: DC
  Administered 2020-04-26 – 2020-05-01 (×5): 50 mg via ORAL
  Filled 2020-04-26 (×6): qty 1

## 2020-04-26 MED ORDER — ETHAMBUTOL HCL 400 MG PO TABS
1500.0000 mg | ORAL_TABLET | Freq: Every day | ORAL | Status: DC
  Administered 2020-04-26: 1500 mg via ORAL
  Filled 2020-04-26 (×2): qty 3

## 2020-04-26 MED ORDER — RIFAMPIN 300 MG PO CAPS
600.0000 mg | ORAL_CAPSULE | Freq: Every day | ORAL | Status: DC
  Administered 2020-04-26 – 2020-05-01 (×5): 600 mg via ORAL
  Filled 2020-04-26 (×6): qty 2

## 2020-04-26 MED ORDER — SODIUM CHLORIDE 0.9 % IV SOLN: INTRAVENOUS | Status: DC

## 2020-04-26 MED ORDER — GADOBUTROL 1 MMOL/ML IV SOLN
7.5000 mL | Freq: Once | INTRAVENOUS | Status: AC | PRN
  Administered 2020-04-26: 7.5 mL via INTRAVENOUS

## 2020-04-26 MED ORDER — DEXAMETHASONE 6 MG PO TABS
12.0000 mg | ORAL_TABLET | Freq: Every day | ORAL | Status: DC
  Administered 2020-04-26 – 2020-04-27 (×2): 12 mg via ORAL
  Filled 2020-04-26 (×2): qty 2
  Filled 2020-04-26: qty 3

## 2020-04-26 NOTE — ED Provider Notes (Signed)
  Provider Note MRN:  [id number]  Arrival date & time: 04/26/20    ED Course and Medical Decision Making  Assumed care from Dr. Renaye Rakers at shift change.  Fever, transient neurological symptoms, lumbar puncture with concern for meningitis.  Neurology is following, will admit to medicine.  Procedures  Final Clinical Impressions(s) / ED Diagnoses     ICD-10-CM   1. Fever, unspecified fever cause  R50.9   2. History of COVID-19  Z86.16   3. Transient neurologic deficit  R29.818   4. Transient speech disturbance  R47.9     ED Discharge Orders    None      Discharge Instructions   None     Elmer Sow. Pilar Plate, MD [facility] Medicine [facility] [email or web address]    Sabas Sous, MD 04/26/20 [phone]

## 2020-04-26 NOTE — ED Provider Notes (Signed)
.  Critical Care Performed by: Terald Sleeper, MD Authorized by: Terald Sleeper, MD   Critical care provider statement:    Critical care time (minutes):  35   Critical care was necessary to treat or prevent imminent or life-threatening deterioration of the following conditions:  CNS failure or compromise   Critical care was time spent personally by me on the following activities:  Discussions with consultants, evaluation of patient's response to treatment, examination of patient, ordering and performing treatments and interventions, ordering and review of laboratory studies, ordering and review of radiographic studies, pulse oximetry, re-evaluation of patient's condition, obtaining history from patient or surrogate and review of old charts Comments:     Management of meningitis      Aaron Chapman, Kermit Balo, MD 04/26/20 [phone]

## 2020-04-26 NOTE — ED Notes (Signed)
Pt returned from mri

## 2020-04-26 NOTE — Progress Notes (Signed)
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   The right sided brainstem lesions seen on this admission's MRI may therefore have a wider imaging DDx of postsurgical encephalomalacia with chronic reactive change (enhancement) versus TB versus fungal or other slowly progressive infection that may have been introduced during the prior surgery. A combination of postsurgical changes and infection should also be considered. Postsurgical change alone is essentially off the DDx, however, given the CSF findings.   Electronically signed: Dr. Caryl Pina

## 2020-04-26 NOTE — ED Notes (Signed)
Pt is sinus tach on monitor 

## 2020-04-26 NOTE — TOC Initial Note (Signed)
Transition of Care [facility]) - Initial/Assessment Note    Patient Details  Name: Aaron Chapman MRN: [id number] Date of Birth: 05-19-1976  [facility] of [facility]) CM/SW Contact:    Lockie Pares, RN Phone Number: 04/26/2020, 10:33 AM  Clinical Narrative:                 Admitted with feeling poorly, mennigitis. Patient is from [place], came over in January. Had COVID a few weeks ago. Is uninsured, does not have PCP. Will refer to [facility]. May need MATCH post hospitalizaiton. Spoke to [employer] RN regarding possibility of TB spinal./Mennigitis. She stated we must verifiy that it is not in lung, if MD does not feel it is in lungs then patient does not have to be on isolation and does NOT have to be reported to health department. IP will follow case as we continue to flesh out etiology of meningitis.   Expected Discharge Plan: Home/Self Care Barriers to Discharge: Inadequate or no insurance   Patient Goals and CMS Choice        Expected Discharge Plan and Services Expected Discharge Plan: Home/Self Care       Living arrangements for the past 2 months: Single Family Home                                      Prior Living Arrangements/Services Living arrangements for the past 2 months: Single Family Home Lives with:: Spouse Patient language and need for interpreter reviewed:: Yes (Spanish SPeaking)        Need for Family Participation in Patient Care: Yes (Comment)     Criminal Activity/Legal Involvement Pertinent to Current Situation/Hospitalization: No - Comment as needed  Activities of Daily Living      Permission Sought/Granted                  Emotional Assessment       Orientation: : Oriented to Self, Oriented to Place, Oriented to  Time, Oriented to Situation Alcohol / Substance Use: Not Applicable Psych Involvement: No (comment)  Admission diagnosis:  Meningitis [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  Fever and chills 04/26/2020  . Bilateral headaches 04/26/2020  . Meningitis 04/26/2020   PCP:  Patient, No Pcp Per Pharmacy:   [facility] - [place], [place] - [address] [place] [address] Phone: [phone] Fax: [phone]     Social Determinants of Health (SDOH) Interventions    Readmission Risk Interventions No flowsheet data found.

## 2020-04-26 NOTE — H&P (Signed)
History and Physical   Aaron Chapman WUJ:[id number] DOB: 09-Dec-1975 DOA: 04/25/2020  Referring MD/NP/PA: Dr. Sedonia Small  PCP: Patient, No Pcp Per   Outpatient Specialists: None  Patient coming from: Home  Chief Complaint: Fever and headache for 3 weeks  HPI: Aaron Chapman is a 44 y.o. male with medical history significant of recent COVID-19 infection on August 14.  Which was treated symptomatically and patient just completed dexamethasone last week.  He has been having fever intermittently for more than a month.  Fever has been going on prior to his diagnosis COVID-19.  He continued to be febrile for at least 15 days.  Drawn the.  He was having nonproductive cough or shortness of breath.  This has resolved.  He started becoming afebrile before the fevers return and this time around with severe headaches.  His heart shortness of breath cough intermittent chest pain.  He was seen in the ER 4 days ago with the same symptoms.  No sick contacts but at the time he was complained to have a rash all over his trunk and upper and lower extremities.  Rash was apparently not pruritic but mostly has disappeared.  He has still has loss of appetite and therefore lost a lot of weight in the last few weeks.  He has been having fevers up to 103 and 104.  Patient reported history of some: Inflammation but this is years ago.  He is an immigrant from [place].  He has had multiple work-up by his PCP.  Has had steroids.  Suspicion of multiple other causes.  Patient has had history of Zika dengue and Chikungunya infections.  He received all his childhood vaccinations in [place] before coming to the [place] in January this year.  He is also received the [employer] vaccination.  Today he had lumbar puncture done that showed significant increase in protein low glucose and increased white cells.  The picture however is more consistent with nonbacterial meningitis certainly not viral.  He is being admitted with  suspicion of some fungal or atypical organism meningitis but could also still be bacterial meningitis..  ED Course: Temperature is 98.1 blood pressure 146/128 pulse 99 respiratory rate of 21 oxygen sats 88% room air.  CBC and chemistry all within normal today.  His glucose is 104.  QuantiFERON gold currently is pending.  Lumbar puncture showed clear CSF with glucose of 23 RBC 349 WBC 106 mainly lymphs total protein 357.  Gram stain showed a lot of white cells.  MRI of the brain showed hyperintense foci involving the right middle cerebellar peduncle suspicious for demyelination normal MRA head and neck.  MRI head and cervical spine with contrast currently pending.  Neurology and ID consulted and patient being admitted with suspicion of possible meningitis.  Review of Systems: As per HPI otherwise 10 point review of systems negative.    Past Medical History:  Diagnosis Date  . COVID-19     History reviewed. No pertinent surgical history.   reports that he has never smoked. He has never used smokeless tobacco. He reports previous alcohol use. He reports previous drug use.  Allergies  Allergen Reactions  . Aspirin Swelling    Facial swelling    No family history on file.   Prior to Admission medications   Medication Sig Start Date End Date Taking? Authorizing Provider  Ascorbic Acid (VITAMIN C) 1000 MG tablet Take 1,000 mg by mouth daily.   Yes [provider]  ibuprofen (ADVIL) 800 MG tablet  Take 800 mg by mouth every 8 (eight) hours as needed for fever.   Yes [provider]  promethazine-dextromethorphan (PROMETHAZINE-DM) 6.25-15 MG/5ML syrup Take 5 mLs by mouth 4 (four) times daily as needed for cough. Patient taking differently: Take 5 mLs by mouth 2 (two) times daily as needed for cough.  04/22/20  Yes McDonald, Mia A, PA-C  VITAMIN A PO Take 1 capsule by mouth daily.   Yes [provider]  amoxicillin-clavulanate (AUGMENTIN) 875-125 MG tablet Take 1 tablet  by mouth 2 (two) times daily.    [provider]  dexamethasone (DECADRON) 4 MG tablet Take 4 mg by mouth daily.    [provider]    Physical Exam: Vitals:   04/25/20 1930 04/25/20 2145 04/25/20 2230 04/25/20 2300  BP: (!) 137/91 120/72 117/75   Pulse: 96 86 85 80  Resp:      Temp:      TempSrc:      SpO2: 96% 95% 95% 96%  Weight:      Height:          Constitutional: Acutely ill looking, weak, no distress Vitals:   04/25/20 1930 04/25/20 2145 04/25/20 2230 04/25/20 2300  BP: (!) 137/91 120/72 117/75   Pulse: 96 86 85 80  Resp:      Temp:      TempSrc:      SpO2: 96% 95% 95% 96%  Weight:      Height:       Eyes: PERRL, lids and conjunctivae normal ENMT: Mucous membranes are dry. Posterior pharynx clear of any exudate or lesions.Normal dentition.  Neck: normal, supple, no masses, no thyromegaly Respiratory: clear to auscultation bilaterally, no wheezing, no crackles. Normal respiratory effort. No accessory muscle use.  Cardiovascular: Regular rate and rhythm, no murmurs / rubs / gallops. No extremity edema. 2+ pedal pulses. No carotid bruits.  Abdomen: no tenderness, no masses palpated. No hepatosplenomegaly. Bowel sounds positive.  Musculoskeletal: no clubbing / cyanosis. No joint deformity upper and lower extremities. Good ROM, no contractures. Normal muscle tone.  Skin: no rashes, lesions, ulcers. No induration Neurologic: CN 2-12 grossly intact. Sensation intact, DTR normal. Strength 5/5 in all 4.  No neck stiffness, no visual changes Psychiatric: Normal judgment and insight. Alert and oriented x 3. Normal mood.     Labs on Admission: I have personally reviewed following labs and imaging studies  CBC: Recent Labs  Lab 04/21/20 1416 04/25/20 1118  WBC 8.0 4.5  NEUTROABS 6.2 2.9  HGB 14.3 15.1  HCT 44.2 44.8  MCV 92.7 93.1  PLT 196 765   Basic Metabolic Panel: Recent Labs  Lab 04/21/20 1416 04/25/20 1118  NA 134* 137  K 4.1 4.7  CL  99 103  CO2 25 26  GLUCOSE 109* 104*  BUN 11 6  CREATININE 0.83 0.78  CALCIUM 9.0 9.4   GFR: Estimated Creatinine Clearance: 130.4 mL/min (by C-G formula based on SCr of 0.78 mg/dL). Liver Function Tests: Recent Labs  Lab 04/21/20 1416 04/25/20 1118  AST 17 18  ALT 27 28  ALKPHOS 36* 35*  BILITOT 0.9 0.9  PROT 6.8 7.0  ALBUMIN 3.7 3.8   No results for input(s): LIPASE, AMYLASE in the last 168 hours. No results for input(s): AMMONIA in the last 168 hours. Coagulation Profile: No results for input(s): INR, PROTIME in the last 168 hours. Cardiac Enzymes: No results for input(s): CKTOTAL, CKMB, CKMBINDEX, TROPONINI in the last 168 hours. BNP (last 3 results) No results  for input(s): PROBNP in the last 8760 hours. HbA1C: No results for input(s): HGBA1C in the last 72 hours. CBG: No results for input(s): GLUCAP in the last 168 hours. Lipid Profile: No results for input(s): CHOL, HDL, LDLCALC, TRIG, CHOLHDL, LDLDIRECT in the last 72 hours. Thyroid Function Tests: No results for input(s): TSH, T4TOTAL, FREET4, T3FREE, THYROIDAB in the last 72 hours. Anemia Panel: No results for input(s): VITAMINB12, FOLATE, FERRITIN, TIBC, IRON, RETICCTPCT in the last 72 hours. Urine analysis:    Component Value Date/Time   COLORURINE YELLOW 04/25/2020 1409   APPEARANCEUR CLEAR 04/25/2020 1409   LABSPEC 1.006 04/25/2020 1409   PHURINE 5.0 04/25/2020 1409   GLUCOSEU NEGATIVE 04/25/2020 1409   HGBUR NEGATIVE 04/25/2020 1409   BILIRUBINUR NEGATIVE 04/25/2020 1409   KETONESUR NEGATIVE 04/25/2020 1409   PROTEINUR NEGATIVE 04/25/2020 1409   NITRITE NEGATIVE 04/25/2020 1409   LEUKOCYTESUR NEGATIVE 04/25/2020 1409   Sepsis Labs: _0 (procalcitonin:4,lacticidven:4) ) Recent Results (from the past 240 hour(s))  Blood culture (routine x 2)     Status: None (Preliminary result)   Collection Time: 04/22/20  2:10 AM   Specimen: BLOOD RIGHT HAND  Result Value Ref Range Status   Specimen  Description BLOOD RIGHT HAND  Final   Special Requests   Final    BOTTLES DRAWN AEROBIC AND ANAEROBIC Blood Culture adequate volume   Culture   Final    NO GROWTH 3 DAYS Performed at [facility], [place] [address], [place], [place] [address]    Report Status PENDING  Incomplete  Blood culture (routine x 2)     Status: None (Preliminary result)   Collection Time: 04/22/20  2:20 AM   Specimen: BLOOD LEFT ARM  Result Value Ref Range Status   Specimen Description BLOOD LEFT ARM  Final   Special Requests   Final    BOTTLES DRAWN AEROBIC AND ANAEROBIC Blood Culture adequate volume   Culture   Final    NO GROWTH 3 DAYS Performed at [facility], [place] [address] [place], [place] [address]    Report Status PENDING  Incomplete  CSF culture     Status: None (Preliminary result)   Collection Time: 04/25/20  4:50 PM   Specimen: CSF; Cerebrospinal Fluid  Result Value Ref Range Status   Specimen Description CSF  Final   Special Requests NONE  Final   Gram Stain   Final    ABUNDANT WBC PRESENT,BOTH PMN AND MONONUCLEAR NO ORGANISMS SEEN Performed at [facility], [place] [address] [place], [place] [address]    Culture PENDING  Incomplete   Report Status PENDING  Incomplete     Radiological Exams on Admission: MR ANGIO HEAD WO CONTRAST  Result Date: 04/25/2020 CLINICAL DATA:  Neuro deficit, transient right arm weakness with aphasia. EXAM: MR HEAD WITHOUT CONTRAST MR CIRCLE OF WILLIS WITHOUT CONTRAST MRA OF THE NECK WITHOUT AND WITH CONTRAST TECHNIQUE: Multiplanar, multiecho pulse sequences of the brain, circle of willis and surrounding structures were obtained without intravenous contrast. Angiographic images of the neck were obtained using MRA technique without and with intravenous contrast. CONTRAST:  8.59m GADAVIST GADOBUTROL 1 MMOL/ML IV SOLN COMPARISON:  None. FINDINGS: MR HEAD FINDINGS Brain: T2/FLAIR hyperintense foci involving the right middle cerebellar peduncle (9:7, 6).  No diffusion-weighted signal abnormality. No intracranial hemorrhage. No midline shift, ventriculomegaly or extra-axial fluid collection. No mass lesion. Vascular: Please see CTA head. Skull and upper cervical spine: Normal marrow signal. Sinuses/Orbits: Normal orbits. Clear paranasal sinuses. Trace  mastoid effusion. Other: Right frontal scalp susceptibility artifact. MR CIRCLE OF WILLIS FINDINGS Anterior circulation: The bilateral internal carotid, anterior and middle cerebral arteries are patent and normal caliber. No significant stenosis, proximal occlusion, aneurysm, or vascular malformation. Posterior circulation: Dominant left vertebral artery. Patent bilateral PICA. Dominant right AICA. Normal caliber patent basilar, superior cerebellar and posterior cerebral arteries. No significant stenosis, proximal occlusion, aneurysm, or vascular malformation. Venous sinuses: No evidence of thrombosis. Anatomic variants: Bilateral PCOM hypoplasia. MRA NECK FINDINGS There is no high-grade narrowing or focal aneurysm involving the bilateral carotid arteries. No evidence of dissection. The bilateral vertebral arteries are patent and demonstrate antegrade flow. Dominant left vertebral artery. No evidence of high-grade narrowing or focal aneurysm. Three vessel aortic arch. IMPRESSION: MRI HEAD: T2/FLAIR hyperintense foci involving the right middle cerebellar peduncle are suspicious for demyelination. Consider postcontrast imaging for further evaluation. MRA HEAD: Normal MRA head. MRA NECK: Normal MRA neck. Electronically Signed   By: Primitivo Gauze M.D.   On: 04/25/2020 16:10   MR Angiogram Neck W or Wo Contrast  Result Date: 04/25/2020 CLINICAL DATA:  Neuro deficit, transient right arm weakness with aphasia. EXAM: MR HEAD WITHOUT CONTRAST MR CIRCLE OF WILLIS WITHOUT CONTRAST MRA OF THE NECK WITHOUT AND WITH CONTRAST TECHNIQUE: Multiplanar, multiecho pulse sequences of the brain, circle of willis and surrounding  structures were obtained without intravenous contrast. Angiographic images of the neck were obtained using MRA technique without and with intravenous contrast. CONTRAST:  8.42m GADAVIST GADOBUTROL 1 MMOL/ML IV SOLN COMPARISON:  None. FINDINGS: MR HEAD FINDINGS Brain: T2/FLAIR hyperintense foci involving the right middle cerebellar peduncle (9:7, 6). No diffusion-weighted signal abnormality. No intracranial hemorrhage. No midline shift, ventriculomegaly or extra-axial fluid collection. No mass lesion. Vascular: Please see CTA head. Skull and upper cervical spine: Normal marrow signal. Sinuses/Orbits: Normal orbits. Clear paranasal sinuses. Trace mastoid effusion. Other: Right frontal scalp susceptibility artifact. MR CIRCLE OF WILLIS FINDINGS Anterior circulation: The bilateral internal carotid, anterior and middle cerebral arteries are patent and normal caliber. No significant stenosis, proximal occlusion, aneurysm, or vascular malformation. Posterior circulation: Dominant left vertebral artery. Patent bilateral PICA. Dominant right AICA. Normal caliber patent basilar, superior cerebellar and posterior cerebral arteries. No significant stenosis, proximal occlusion, aneurysm, or vascular malformation. Venous sinuses: No evidence of thrombosis. Anatomic variants: Bilateral PCOM hypoplasia. MRA NECK FINDINGS There is no high-grade narrowing or focal aneurysm involving the bilateral carotid arteries. No evidence of dissection. The bilateral vertebral arteries are patent and demonstrate antegrade flow. Dominant left vertebral artery. No evidence of high-grade narrowing or focal aneurysm. Three vessel aortic arch. IMPRESSION: MRI HEAD: T2/FLAIR hyperintense foci involving the right middle cerebellar peduncle are suspicious for demyelination. Consider postcontrast imaging for further evaluation. MRA HEAD: Normal MRA head. MRA NECK: Normal MRA neck. Electronically Signed   By: CPrimitivo GauzeM.D.   On: 04/25/2020 16:10    MR BRAIN WO CONTRAST  Result Date: 04/25/2020 CLINICAL DATA:  Neuro deficit, transient right arm weakness with aphasia. EXAM: MR HEAD WITHOUT CONTRAST MR CIRCLE OF WILLIS WITHOUT CONTRAST MRA OF THE NECK WITHOUT AND WITH CONTRAST TECHNIQUE: Multiplanar, multiecho pulse sequences of the brain, circle of willis and surrounding structures were obtained without intravenous contrast. Angiographic images of the neck were obtained using MRA technique without and with intravenous contrast. CONTRAST:  8.53mGADAVIST GADOBUTROL 1 MMOL/ML IV SOLN COMPARISON:  None. FINDINGS: MR HEAD FINDINGS Brain: T2/FLAIR hyperintense foci involving the right middle cerebellar peduncle (9:7, 6). No diffusion-weighted signal abnormality. No intracranial hemorrhage. No midline  shift, ventriculomegaly or extra-axial fluid collection. No mass lesion. Vascular: Please see CTA head. Skull and upper cervical spine: Normal marrow signal. Sinuses/Orbits: Normal orbits. Clear paranasal sinuses. Trace mastoid effusion. Other: Right frontal scalp susceptibility artifact. MR CIRCLE OF WILLIS FINDINGS Anterior circulation: The bilateral internal carotid, anterior and middle cerebral arteries are patent and normal caliber. No significant stenosis, proximal occlusion, aneurysm, or vascular malformation. Posterior circulation: Dominant left vertebral artery. Patent bilateral PICA. Dominant right AICA. Normal caliber patent basilar, superior cerebellar and posterior cerebral arteries. No significant stenosis, proximal occlusion, aneurysm, or vascular malformation. Venous sinuses: No evidence of thrombosis. Anatomic variants: Bilateral PCOM hypoplasia. MRA NECK FINDINGS There is no high-grade narrowing or focal aneurysm involving the bilateral carotid arteries. No evidence of dissection. The bilateral vertebral arteries are patent and demonstrate antegrade flow. Dominant left vertebral artery. No evidence of high-grade narrowing or focal aneurysm. Three  vessel aortic arch. IMPRESSION: MRI HEAD: T2/FLAIR hyperintense foci involving the right middle cerebellar peduncle are suspicious for demyelination. Consider postcontrast imaging for further evaluation. MRA HEAD: Normal MRA head. MRA NECK: Normal MRA neck. Electronically Signed   By: Primitivo Gauze M.D.   On: 04/25/2020 16:10   DG Chest Portable 1 View  Result Date: 04/25/2020 CLINICAL DATA:  Weakness. EXAM: PORTABLE CHEST 1 VIEW COMPARISON:  April 22, 2020 FINDINGS: The cardiac silhouette is normal. There is bilateral hilar lymphadenopathy similar to the prior studies There is no evidence of focal airspace consolidation, pleural effusion or pneumothorax. Osseous structures are without acute abnormality. Soft tissues are grossly normal. IMPRESSION: Bilateral hilar lymphadenopathy similar to the prior studies. Electronically Signed   By: Fidela Salisbury M.D.   On: 04/25/2020 12:14    EKG: Independently reviewed.  Sinus rhythm otherwise no significant changes  Assessment/Plan Active Problems:   Fever and chills   Bilateral headaches   Meningitis     #1 suspected meningitis: Patient has recurrent and persistent headache and fever.  Lumbar puncture is abnormal suggestive of possible meningitis.  He is otherwise weak.  No fever today.  Patient initiated on treatment for empiric meningitis with vancomycin acyclovir and Rocephin.  We will follow ID recommendations.  Also neurology recommendations.  Some demyelinating disease could be also possible.  With his myriad of tropical diseases this could be follow-up to the house.  #2 bilateral headaches: May be related to ongoing symptoms above or meningitis.  No neck stiffness.  #3 recent COVID-19 infection: Patient has completed treatment.  Slight hypoxia noted but largely oxygen sats are normal on room air.     DVT prophylaxis: Heparin Code Status: Full code Family Communication: No family at bedside Disposition Plan: To be  determined Consults called: Neurology Dr. Malen Gauze and infectious disease Admission status: Inpatient progressive  Severity of Illness: The appropriate patient status for this patient is INPATIENT. Inpatient status is judged to be reasonable and necessary in order to provide the required intensity of service to ensure the patient's safety. The patient's presenting symptoms, physical exam findings, and initial radiographic and laboratory data in the context of their chronic comorbidities is felt to place them at high risk for further clinical deterioration. Furthermore, it is not anticipated that the patient will be medically stable for discharge from the hospital within 2 midnights of admission. The following factors support the patient status of inpatient.   " The patient's presenting symptoms include persistent fever and headache over the last month. " The worrisome physical exam findings include slight confusion. " The initial radiographic and laboratory  data are worrisome because of lumbar puncture consistent with possible meningitis. " The chronic co-morbidities include recent COVID-19 infection.   * I certify that at the point of admission it is my clinical judgment that the patient will require inpatient hospital care spanning beyond 2 midnights from the point of admission due to high intensity of service, high risk for further deterioration and high frequency of surveillance required.Barbette Merino MD [facility] Pager [phone]  If 7PM-7AM, please contact night-coverage [email or web address] Password [facility]  04/26/2020, 12:44 AM

## 2020-04-26 NOTE — Progress Notes (Addendum)
Pharmacy Antibiotic Note  Aaron Chapman is a 44 y.o. male admitted on 04/25/2020 with meningitis.  Pharmacy has been consulted for vancomycin dosing.  Presented with 2-3 week hx of worsening head/neck pain, persistent fevers, and weakness. WBC 4.5, LA 1.5, afebrile. Scr 0.78 (CrCl >100 mL/min). CSF still pending but glucose 23, WBC 106, total protein 357.    Pt is being r/o for bacterial vs TB meningitis. RIPE is being ordered in addition to his current abx.   Addendum:  Dr. Elinor Parkinson would also like to start this pt on steroids for his potential TB meningitis. Data suggests a 6 wks course. (12mg /day x3 wks then taper over 3 wks)  Rifampin 10mg /kg/day INH 5mg /kg/day PZA 15-30mg /kg/day EMB 15-25mg /kg/day B6 50mg /day  Plan: Ceftriaxone 2g IV every 12 hours Vancomycin 1750 mg IV once then 1000 mg IV every 8 hours  Rifampin 600mg  PO qday INH 300mg  PO qday EMB 1500mg  PO qday PZA 2000mg  PO qday Vit B6 50mg  PO qday Dexamethasone 12mg  PO qday Monitor renal fx, cx results, clinical pic, and vanc levels as appropraite   Height: 6\' 1"  (185.4 cm) Weight: 86.2 kg (190 lb) IBW/kg (Calculated) : 79.9  Temp (24hrs), Avg:98.2 F (36.8 C), Min:97.8 F (36.6 C), Max:99 F (37.2 C)  Recent Labs  Lab 04/21/20 1416 04/25/20 1118 04/25/20 1335  WBC 8.0 4.5  --   CREATININE 0.83 0.78  --   LATICACIDVEN 1.4 1.4 1.5    Estimated Creatinine Clearance: 130.4 mL/min (by C-G formula based on SCr of 0.78 mg/dL).    Allergies  Allergen Reactions  . Aspirin Swelling    Facial swelling    Antimicrobials this admission: Vancomycin 9/25 >>  Ceftriaxone 9/25 >>  RIPE 9/26>> Dose adjustments this admission: N/A  Microbiology results: 9/25 CSF Cx: pending  , PharmD, BCIDP, AAHIVP, CPP Infectious Disease Pharmacist 04/26/2020 9:01 AM

## 2020-04-26 NOTE — Progress Notes (Signed)
MR brain and MR C-spine with contrast suspicious for basal meningitis with top of the differential being TB. CSF also concerning for bacterial v tubercular meningitis. Stat ID consult for recs for abx. Communicated to the primary hospitalist via securechat.   -- Milon Dikes, MD [facility] Pager: [phone] If 7pm to 7am, please call on call as listed on AMION.

## 2020-04-26 NOTE — ED Notes (Signed)
SDU Breakfast Ordered 

## 2020-04-26 NOTE — Consult Note (Addendum)
[facility]                                                                                        Patient Identification: Patient Name: Aaron HeinzRafael Chapman MRN: [id number] Admit Date: 04/25/2020 10:50 AM Age: @AG @Today 's Date: 04/26/2020 Reason for consult:   Active Problems:   Fever and chills   Bilateral headaches   Meningitis   Antibiotics: RIPE therapy Day 1, Dexamethasone Day 1   Assessment 1. 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  - CSF sample WBC 106, lymphocytic, Glucose 23, Protein 357, CSF routine cx no organisms in gram stain and NGTD - Cryptococcal ag in CSF negative   2. 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 This has been communicated to the primary team/pharmacist early this morning. I have also requested to send AFB smear and AFB cx on the remaining CSF sample to the micro lab. However, per micro lab they do not have much specimen to run fungal stain and cx.  - HIV and Qunatiferon test - Can DC Vancomycin and ceftriaxone - Sputum AFB smear and AFB cx if patient is able to produce phlegm.  - Monitor LFTs while on RIPE therapy  - Follow up CSF cytology and CSF cultures   Dr Drue SecondSnider will see tomorrow  Odette FractionSabina Atzin Buchta, MD Infectious Diseases  [facility]   To contact the attending provider between 8A-5P or the covering provider during after hours 5P-8A, please log into the web site [email or web address] and access using universal [place] password for that web site. If you do not have the password, please call the hospital  operator. __________________________________________________________________________________________________________ HPI and Hospital Course: 44 Y O male from [place] with a recent h/o COVID infection in August, [place] in childhood, Zika virus in 2016 and Chikungunya in 2017 who presented to the ED on 9/26 with complaints of fevers, headaches, episode of RT arm weakness and difficulty speaking. He was diagnosed with COVID infection in August 14. He completed a course of Augmentin prescribed by his PCP and has also received steroids. He was persistently febrile for approximately 2 weeks.Given persistent fevers, his PCP ordered a CT scan of the chest on9/10, which demonstrated right lower lobe 4 mm nodule, left lower lobe 3 mm nodule and mediastinal and bilateral hilar lymphadenopathy. There is also a focus of sclerosis on the L1 vertebral body it was likely reflecting a bone island. He was then afebrile for a week when his fevers returned accompanied by severe headaches, shortness of breath, nonproductive cough, and mild, dull, intermittent chest pain. Fevers was associated with generalised headaches, with neck pain but denies stiffness. He also had an episode of difficulty speaking and arm weakness one night before admit which was spontaneously resolved and no symptoms currently. He presented to the ED on 9/21after he developed a diffuse rash to his trunk and upper and lower extremities. Patient was discharged from the ED  as he appeared to be stable with a plan to follow up with ID and Pulm.   History taken with the help of phone spanish interpreter. Wife at bedside. Patient denies having any h/o TB in the past or TB in his family members. He denies any contacts with known TB. He denies any h/o IVDU, incarceration and being homeless. He moved to the [place] from [place] in January 2021.He was also fully vaccinated against COVID-19. He also has loss of appetite since he was tested positive  for COVID and has lost approx 4 kgs. Denies any neck stiffness, back pain, blurry vision or ear pain. Denies any recent travel, no pets at home.   ROS: General- FEVER+, CHILLS+, NIGHT SWEATS+, LOSS OF APPETITE AND LOSS OF WEIGHT+ HEENT - HEADACHE AND NECK PAIN+, denies blurry vision Chest - denies any chest pain, SOB or cough CVS- Denies any dizziness, palpitations Abdomen- denies any nausea, vomiting, abdominal pain and diarrhea Neuro - Denies any weakness, numbness  Psych - denies any changes in mood irritability or depressive symptoms GU- Denies any burning, dysuria or increased frequency of urination Skin - denies any rashes MSK - denies any joint pain/swelling or ROM   Past Medical History:  Diagnosis Date  . COVID-19    History reviewed. No pertinent surgical history.   Scheduled Meds: . dexamethasone  12 mg Oral Q breakfast  . ethambutol  1,500 mg Oral Daily  . isoniazid  300 mg Oral Daily  . prochlorperazine  10 mg Intravenous Once  . pyrazinamide  2,000 mg Oral Daily  . vitamin B-6  50 mg Oral Daily  . rifampin  600 mg Oral Daily   Continuous Infusions: . cefTRIAXone (ROCEPHIN)  IV Stopped (04/26/20 0135)  . vancomycin 1,000 mg (04/26/20 0847)   PRN Meds:.  Allergies  Allergen Reactions  . Aspirin Swelling    Facial swelling   Social History   Socioeconomic History  . Marital status: Married    Spouse name: Not on file  . Number of children: Not on file  . Years of education: Not on file  . Highest education level: Not on file  Occupational History  . Not on file  Tobacco Use  . Smoking status: Never Smoker  . Smokeless tobacco: Never Used  Substance and Sexual Activity  . Alcohol use: Not Currently  . Drug use: Not Currently  . Sexual activity: Not on file  Other Topics Concern  . Not on file  Social History Narrative  . Not on file   Social Determinants of Health   Financial Resource Strain:   . Difficulty of Paying Living Expenses: Not on  file  Food Insecurity:   . Worried About Programme researcher, broadcasting/film/video in the Last Year: Not on file  . Ran Out of Food in the Last Year: Not on file  Transportation Needs:   . Lack of Transportation (Medical): Not on file  . Lack of Transportation (Non-Medical): Not on file  Physical Activity:   . Days of Exercise per Week: Not on file  . Minutes of Exercise per Session: Not on file  Stress:   . Feeling of Stress : Not on file  Social Connections:   . Frequency of Communication with Friends and Family: Not on file  . Frequency of Social Gatherings with Friends and Family: Not on file  . Attends Religious Services: Not on file  . Active Member of Clubs or Organizations: Not on file  . Attends Banker  Meetings: Not on file  . Marital Status: Not on file  Intimate Partner Violence:   . Fear of Current or Ex-Partner: Not on file  . Emotionally Abused: Not on file  . Physically Abused: Not on file  . Sexually Abused: Not on file   Vitals BP 124/84   Pulse (!) 103   Temp 99 F (37.2 C) (Oral)   Resp 15   Ht 6\' 1"  (1.854 m)   Wt 86.2 kg   SpO2 99%   BMI 25.07 kg/m      Examination  Gen: Alert and oriented x 3, not in acute distress HEENT: [place]/AT, PERLA, EOMI, no scleral icterus, no pale conjunctivae, hearing normal, mucosa moist Neck: Supple, no lymphadenopathy Cardio: RRR, +S1 and S2, no murmur  Resp: CTAB; no wheezes, rhonchi, or rales GI: Soft, nontender, nondistended, bowel sounds + Musc: No joint swelling or tenderness Extremities: No cyanosis, clubbing, or edema; palpable PT and DP pulses Skin: No rashes, lesions, or ecchymoses Neuro: BILATERAL SENSATION INTACT, 5/5 POWER IN BOTH UE AND LE.  Back: NO RIGIDITY, NO SPINAL AND CERVICALTENDERNESS  Psych: Calm, cooperative  Microbiology Results for orders placed or performed during the hospital encounter of 04/25/20  CSF culture     Status: None (Preliminary result)   Collection Time: 04/25/20  4:50 PM    Specimen: CSF; Cerebrospinal Fluid  Result Value Ref Range Status   Specimen Description CSF  Final   Special Requests NONE  Final   Gram Stain   Final    ABUNDANT WBC PRESENT,BOTH PMN AND MONONUCLEAR NO ORGANISMS SEEN    Culture   Final    NO GROWTH < 24 HOURS Performed at [facility], [address] [place], [place]    Report Status PENDING  Incomplete  Culture, blood (routine x 2)     Status: None (Preliminary result)   Collection Time: 04/26/20 12:59 AM   Specimen: BLOOD  Result Value Ref Range Status   Specimen Description BLOOD RIGHT ARM  Final   Special Requests   Final    BOTTLES DRAWN AEROBIC AND ANAEROBIC Blood Culture adequate volume   Culture   Final    NO GROWTH < 12 HOURS Performed at [facility], [address], [place], [place]    Report Status PENDING  Incomplete  Culture, blood (routine x 2)     Status: None (Preliminary result)   Collection Time: 04/26/20  1:04 AM   Specimen: BLOOD  Result Value Ref Range Status   Specimen Description BLOOD RIGHT HAND  Final   Special Requests   Final    BOTTLES DRAWN AEROBIC AND ANAEROBIC Blood Culture adequate volume   Culture   Final    NO GROWTH < 12 HOURS Performed at [facility], [address], [place], [place]    Report Status PENDING  Incomplete    Pertinent Lab seen by me: CBC Latest Ref Rng & Units 04/25/2020 04/21/2020  WBC 4.0 - 10.5 K/uL 4.5 8.0  Hemoglobin 13.0 - 17.0 g/dL 04/23/2020 70.6  Hematocrit 39 - 52 % 44.8 44.2  Platelets 150 - 400 K/uL 200 196   CMP Latest Ref Rng & Units 04/25/2020 04/21/2020  Glucose 70 - 99 mg/dL 04/23/2020) 628(B)  BUN 6 - 20 mg/dL 6 11  Creatinine 151(V - 1.24 mg/dL 6.16 0.73  Sodium 7.10 - 145 mmol/L 137 134(L)  Potassium 3.5 - 5.1 mmol/L 4.7 4.1  Chloride 98 - 111  mmol/L 103 99  CO2 22 - 32 mmol/L 26 25  Calcium 8.9 - 10.3 mg/dL 9.4 9.0  Total Protein 6.5 - 8.1 g/dL 7.0 6.8  Total Bilirubin 0.3 - 1.2 mg/dL 0.9 0.9  Alkaline  Phos 38 - 126 U/L 35(L) 36(L)  AST 15 - 41 U/L 18 17  ALT 0 - 44 U/L 28 27    Pertinent Imagings/Other Imagings Plain films and CT images have been personally visualized and interpreted; radiology reports have been reviewed. Decision making incorporated into the Impression / Recommendations.  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 Given the basilar predilection, atypical etiologies including TB and/or fungal meningitis could be considered. No discrete abscess, hydrocephalus, or other complication at this time.   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 Associated leptomeningeal enhancement noted about the brainstem and cerebellum, better evaluated on corresponding brain MRI. Given the history of fevers, headaches, and abnormal CSF values, findings most concerning for acute infection/meningitis. No associated cord edema or frank intramedullary enhancement.  Chest Xray 04/25/20 FINDINGS: The cardiac silhouette is normal. There is bilateral hilar lymphadenopathy similar to the prior studies  There is no evidence of focal airspace consolidation, pleural effusion or pneumothorax.  Osseous structures are without acute abnormality. Soft tissues are grossly normal.  IMPRESSION: Bilateral hilar lymphadenopathy similar to the prior studies.   CT chest 04/10/20 FINDINGS: Cardiovascular: Normal heart size. No pericardial effusion. Thoracic aorta is normal in caliber.  Mediastinum/Nodes: Mediastinal and hilar adenopathy is present. For example, right subcarinal node measuring 1.6 x  2.4 cm (series 2, image 78); right hilar node measuring 3.6 x 2.2 cm (image 85); pretracheal node measuring 1.4 x 1.2 cm (image 46). Few small foci of right hilar calcification. Thyroid is unremarkable. Esophagus is unremarkable.  Lungs/Pleura: No consolidation or mass. There is a 3 mm nodule of the left lower lobe (series 5, image 84). Right lower lobe nodule measuring 4 mm (image 120)  Upper Abdomen: No acute abnormality.  Musculoskeletal: Focus of sclerosis within the L1 vertebral body probably reflects a bone island. No acute osseous abnormality.  IMPRESSION: Mediastinal and bilateral hilar lymphadenopathy.  Right lower lobe 4 mm nodule. Left lower lobe 3 mm nodule. No follow-up needed if patient is low-risk (and has no known or suspected primary neoplasm). Non-contrast chest CT can be considered in 12 months depending on the etiology of above.

## 2020-04-26 NOTE — Progress Notes (Signed)
Patient admitted this morning with complaints of fever headache and neck stiffness.  Patient underwent work-up including lumbar puncture MRI which shows concerns of TB meningitis.  Patient seen and examined at bedside.  As above significant other is also at the bedside.  Patient tells me he does not have any respiratory symptoms and his only complaint is headache fevers and neck stiffness.  Denies any contacts or personal previous diagnosis of tuberculosis.  Overall his physical exam is unremarkable.  Vital signs are overall stable.  Discussed case with neurology and infectious disease.  Patient started on RIPE treatment and steroids.  HIV testing ordered.  Because patient does not have any respiratory symptoms, he does not need to be on airborne/contact precautions from TB and covid 19 stainpoint.  Time spent-20 minutes  Stephania Fragmin MD [facility]

## 2020-04-26 NOTE — ED Notes (Signed)
DMITTING DOCTOR AT  THE BEDSIDE

## 2020-04-27 ENCOUNTER — Inpatient Hospital Stay (HOSPITAL_COMMUNITY): Payer: Self-pay

## 2020-04-27 LAB — COMPREHENSIVE METABOLIC PANEL
ALT: 24 U/L (ref 0–44)
AST: 14 U/L — ABNORMAL LOW (ref 15–41)
Albumin: 3.5 g/dL (ref 3.5–5.0)
Alkaline Phosphatase: 35 U/L — ABNORMAL LOW (ref 38–126)
Anion gap: 8 (ref 5–15)
BUN: 12 mg/dL (ref 6–20)
CO2: 24 mmol/L (ref 22–32)
Calcium: 9.1 mg/dL (ref 8.9–10.3)
Chloride: 103 mmol/L (ref 98–111)
Creatinine, Ser: 0.68 mg/dL (ref 0.61–1.24)
GFR calc Af Amer: >60 mL/min (ref >60)
GFR calc non Af Amer: >60 mL/min (ref >60)
Glucose, Bld: 102 mg/dL — ABNORMAL HIGH (ref 70–99)
Potassium: 4.7 mmol/L (ref 3.5–5.1)
Sodium: 135 mmol/L (ref 135–145)
Total Bilirubin: 1 mg/dL (ref 0.3–1.2)
Total Protein: 6.6 g/dL (ref 6.5–8.1)

## 2020-04-27 LAB — PATHOLOGIST SMEAR REVIEW: Path Review: INCREASED

## 2020-04-27 LAB — CULTURE, BLOOD (ROUTINE X 2)
Culture: NO GROWTH
Culture: NO GROWTH
Special Requests: ADEQUATE
Special Requests: ADEQUATE

## 2020-04-27 LAB — HSV DNA BY PCR (REFERENCE LAB)

## 2020-04-27 LAB — MAGNESIUM: Magnesium: 1.9 mg/dL (ref 1.7–2.4)

## 2020-04-27 MED ORDER — ETHAMBUTOL HCL 400 MG PO TABS
1600.0000 mg | ORAL_TABLET | Freq: Every day | ORAL | Status: DC
  Administered 2020-04-27 – 2020-05-01 (×4): 1600 mg via ORAL
  Filled 2020-04-27 (×6): qty 4

## 2020-04-27 MED ORDER — DEXAMETHASONE 4 MG PO TABS
4.0000 mg | ORAL_TABLET | Freq: Three times a day (TID) | ORAL | Status: DC
  Administered 2020-04-27 – 2020-05-02 (×13): 4 mg via ORAL
  Filled 2020-04-27 (×17): qty 1

## 2020-04-27 NOTE — Progress Notes (Signed)
[facility]ious [facility]    Date of Admission:  04/25/2020   Total days of antibiotics 2/RIPE          ID: Aaron Chapman is a 44 y.o. male originally from [place], moved to [place] in jan 2021, to [facility] in march 2021. Had covid in August 2021. 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 MRI imaging showing leptomeningeal signs about brainstem and cerebellum. 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 Also had chest CT on 9/10 showing hilar and mediastinal LAD. History of covid in august 15th where his fever improved but then restarted roughly 2 weeks ago. He is also worried about rash on torso.  doesn't recall getting tb tested prior to coming into the [place]. 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 May have had family contacts as a child. Active Problems:   Fever and chills   Bilateral headaches   Meningitis    Subjective: He reports he is feeling better. His headache is improved and no longer having fevers. He reports 2 days of constipation. He is tolerating abtx without difficulty of nausea. Now only having slight cough  Medications:  . dexamethasone  12 mg Oral Q breakfast  . ethambutol  1,600 mg Oral Daily  . heparin  5,000 Units Subcutaneous Q8H  . isoniazid  300 mg Oral Daily  . pyrazinamide  2,000 mg Oral Daily  . vitamin B-6  50 mg Oral Daily  . rifampin  600 mg Oral Daily    Objective: Vital signs in last 24 hours: Temp:  [98.1 F (36.7 C)-98.7 F (37.1 C)] 98.5 F (36.9 C) (09/27 1150) Pulse Rate:  [65-99] 87 (09/27 1150) Resp:  [12-22] 20 (09/27 1150) BP: (106-139)/(77-95) 130/89 (09/27 1150) SpO2:  [95 %-100 %] 100 % (09/27 1150) Weight:  [79.2 kg] 79.2 kg (09/27 0932) Physical Exam  Constitutional: He is  oriented to person, place, and time. He appears well-developed and well-nourished. No distress.  HENT:  Mouth/Throat: Oropharynx is clear and moist. No oropharyngeal exudate.  Cardiovascular: Normal rate, regular rhythm and normal heart sounds. Exam reveals no gallop and no friction rub.  No murmur heard.  Pulmonary/Chest: Effort normal and breath sounds normal. No respiratory distress. He has no wheezes.  Abdominal: Soft. Bowel sounds are normal. He exhibits no distension. There is no tenderness.  Lymphadenopathy:  He has no cervical adenopathy.  Neurological: He is alert and oriented to person, place, and time.  Skin: Skin is warm and dry. No rash noted. No erythema. Pinpoint papules on torso. Unclear previous vesicles now scabbed over Psychiatric: He has a normal mood and affect. His behavior is normal.     Lab Results Recent Labs    04/25/20 1118 04/25/20 1118 04/26/20 1620 04/27/20 0438  WBC 4.5  --  7.5  --   HGB 15.1  --  13.8  --   HCT 44.8  --  41.2  --   NA 137   < > 137 135  K 4.7   < > 4.4 4.7  CL 103   < > 99 103  CO2 26   < > 23 24  BUN 6   < > 10 12  CREATININE 0.78   < > 0.77 0.68   < > =  values in this interval not displayed.   Liver Panel Recent Labs    04/26/20 1620 04/27/20 0438  PROT 6.4* 6.6  ALBUMIN 3.4* 3.5  AST 15 14*  ALT 25 24  ALKPHOS 38 35*  BILITOT 1.7* 1.0    Microbiology: pending Studies/Results: CT CHEST WO CONTRAST  Result Date: 04/27/2020 CLINICAL DATA:  Adenopathy on recent chest radiograph EXAM: CT CHEST WITHOUT CONTRAST TECHNIQUE: Multidetector CT imaging of the chest was performed following the standard protocol without IV contrast. COMPARISON:  Chest radiograph April 25, 2020 and chest CT April 10, 2020. FINDINGS: Cardiovascular: There is no evident thoracic aortic aneurysm. Visualized great vessels appear unremarkable noncontrast enhanced study. There is no pericardial effusion or pericardial thickening.  Mediastinum/Nodes: Thyroid appears normal. Adenopathy is again noted at multiple sites. Multiple lymph nodes in the paratracheal regions are noted. A lymph node slightly to the right of the trachea anteriorly measures 1.3 x 1.3 cm. A lymph node anterior to the carina on the right measures 1.7 x 1.2 cm. A lymph node in the aortopulmonary window region measures 1.5 x 1.1 cm. A lymph node to the left of the aortic arch measures 1.9 x 1.5 cm. There are hilar lymph nodes bilaterally, less than optimally discerned due to lack of contrast. A lymph node in the left hilar region measures 1.8 x 1.3 cm. A lymph node in the right hilar region measures 1.8 x 1.3 cm. Several other mildly prominent hilar lymph nodes are noted. There are enlarged subcarinal lymph nodes, largest measuring 2.4 x 1 1.8 cm, marginally larger than on most recent chest CT examination. Occasional calcification noted in hilar lymph nodes on the right. No other lymph node calcification is appreciable. Lungs/Pleura: There is no edema or airspace opacity. There is a stable 3 mm nodular opacity in the anterior segment of the left lobe seen on axial slice 94 series 4. A 4 mm nodular opacity in the lateral segment right lower lobe is stable, seen on axial slice 536 series 4. No pleural effusions evident. There are scattered areas of mild atelectatic change in the left lower lobe. Upper Abdomen: There is a focal aneurysm arising from the splenic artery measuring 2.0 x 1.5 cm with peripheral calcification, stable. Visualized upper abdominal structures otherwise appear unremarkable on this noncontrast study. Musculoskeletal: There is a bone island in the lateral aspect of the L1 vertebral body. No blastic or lytic bone lesions evident. No chest wall lesions. IMPRESSION: 1. Multifocal mediastinal and hilar adenopathy again noted with a subcarinal lymph node slightly larger than on recent CT. Other areas of adenopathy appear essentially stable. Adenopathy of this  nature may be seen with granulomatous disease such as tuberculosis as a primary manifestation. Note that neoplasm must be of concern given this finding as well; neoplastic etiology for adenopathy is not excluded on this study. Calcification in right hilar lymph nodes does suggest prior granulomatous disease. This finding may warrant thoracic surgery consultation for potential central lymph node sampling. 2. Stable 3-4 mm nodular opacity on each side. No edema or airspace opacity no pleural effusions. Mild atelectatic change in the left lower lobe must be considered nonspecific. 3. 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 Clinical significance of this finding questionable. Electronically Signed   By: Lowella Grip III M.D.   On: 04/27/2020 11:11   MR BRAIN W CONTRAST  Result Date: 04/26/2020 CLINICAL DATA:  Follow-up examination for demyelinating disease, headaches, fevers. EXAM: MRI HEAD WITH CONTRAST TECHNIQUE: Multiplanar, multiecho  pulse sequences of the brain and surrounding structures were obtained with intravenous contrast. CONTRAST:  7.55m GADAVIST GADOBUTROL 1 MMOL/ML IV SOLN COMPARISON:  Prior brain MRI from 04/25/2020. FINDINGS: Thin-section FLAIR sequences were performed prior to the administration of IV contrast. Images demonstrate diffuse abnormal FLAIR signal intensity involving the cortical sulci/leptomeningeal both cerebral hemispheres, most pronounced at the parieto-occipital regions, fairly symmetric in nature. Additionally, abnormal subtle abnormal leptomeningeal FLAIR signal intensity seen within the right greater than left cerebellum. Patchy parenchymal signal abnormality noted at the right middle cerebellar peduncle, corresponding with abnormality on prior brain MRI. Abnormal FLAIR signal extends to involve the right 7/8 complex and right internal auditory canal, with probable involvement of the right inner ear structures as well (series 3, image 39).  Suspected trace intraventricular debris (series 3, image 77). Following contrast administration, scattered leptomeningeal enhancement seen about the right greater than left cerebellum. Mild patchy parenchymal enhancement noted at the level of the right middle cerebellar peduncle signal abnormality (series 5, image 16). Additionally, prominent leptomeningeal and/or dural enhancement noted at the right CP angle cistern, extending into the right IAC (series 5, image 15). No restricted diffusion seen at the level of the right CP angle cistern on prior brain MRI to suggest abscess at this location. Extension to involve the upper cervical spinal cord again noted, described on corresponding cervical spine MRI. Subtle leptomeningeal enhancement noted about the parieto-occipital regions as well. Given the history of in fevers, headaches, and abnormal CSF values, findings most concerning for acute meningitis with associated cerebritis at the right middle cerebellar peduncle. No frank abscess or other collection. Normal opacification of the major dural sinuses noted. No appreciable ependymal enhancement to suggest associated ventriculitis. 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 Given the basilar predilection, atypical etiologies including TB and/or fungal meningitis could be considered. No discrete abscess, hydrocephalus, or other complication at this time. Electronically Signed   By: BJeannine BogaM.D.   On: 04/26/2020 01:37   MR CERVICAL SPINE W WO CONTRAST  Addendum Date: 04/26/2020   ADDENDUM REPORT: 04/26/2020 01:15 ADDENDUM: Upon further consideration and review of the corresponding postcontrast brain MRI, note is made of subtle circumferential leptomeningeal enhancement about the upper cervical spinal cord, extending from the cervicomedullary region to approximately C2-3. Associated leptomeningeal  enhancement noted about the brainstem and cerebellum, better evaluated on corresponding brain MRI. Given the history of fevers, headaches, and abnormal CSF values, findings most concerning for acute infection/meningitis. No associated cord edema or frank intramedullary enhancement. Electronically Signed   By: BJeannine BogaM.D.   On: 04/26/2020 01:15   Result Date: 04/26/2020 CLINICAL DATA:  Initial evaluation for demyelinating disease. EXAM: MRI CERVICAL SPINE WITHOUT AND WITH CONTRAST TECHNIQUE: Multiplanar and multiecho pulse sequences of the cervical spine, to include the craniocervical junction and cervicothoracic junction, were obtained without and with intravenous contrast. CONTRAST:  7.519mGADAVIST GADOBUTROL 1 MMOL/ML IV SOLN COMPARISON:  Prior brain MRI from 04/25/2020. FINDINGS: Alignment: Physiologic with preservation of the normal cervical lordosis. No listhesis. Vertebrae: 1 cm T2/stir hyperintense lesion within the C6 vertebral body most likely reflects an atypical hemangioma. Associated minimal central vertebral body height loss. Additional small benign hemangioma noted within the C7 vertebral body. No other discrete or worrisome osseous lesions. Underlying bone marrow signal intensity within normal limits. Vertebral body height otherwise maintained without acute or chronic fracture. No other abnormal marrow edema or enhancement. Cord: Normal signal and morphology.  No cord signal changes to suggest demyelinating disease. No abnormal enhancement. Posterior Fossa, vertebral arteries, paraspinal tissues: Negative. Disc levels: C2-C3: Unremarkable. C3-C4: Minimal annular disc bulge with uncovertebral hypertrophy. No significant spinal stenosis. Foramina remain patent. C4-C5: Mild annular disc bulge with uncovertebral hypertrophy. No significant spinal stenosis. Foramina remain patent. C5-C6: Disc desiccation with mild annular disc bulge. No spinal stenosis. Foramina remain patent. C6-C7: Disc  desiccation without significant disc bulge. No canal or foraminal stenosis. No impingement. C7-T1:  Unremarkable. Visualized upper thoracic spine demonstrates no significant finding. IMPRESSION: 1. Normal MRI of the cervical spinal cord. No evidence for demyelinating disease. No abnormal enhancement. 2. 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 No significant spinal stenosis or neural foraminal narrowing. Electronically Signed: By: Jeannine Boga M.D. 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 Not convinced it is the medicines that have acted so quickly but perhaps it is more due to being started on steroids  He had significant LN noted on Chest CT on 9/10. Recommend to repeat chest CT to see if any more signs consistent with active pulmonary TB  Please place on airborne isolation. Need to rule out pulmonary mTB. Will get sputum specimen to start. Please consult pulmonary to get BAL to send for AFB culture and PCR  Will need to see if can send CSF for PCR mTB  For now continue on mTB treatment, changed dex to 8m Q 8hr.  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   Spent 60 min explaining work up and treatment plan with interpreter.  [facility] LLCfor Infectious Diseases Cell: [phone]: [phone]  04/27/2020, 5:51 PM

## 2020-04-27 NOTE — Progress Notes (Signed)
Pt transferred from ED. Pt connected to telemetry, CHG, and vitals. Patient does not speak [place]...translator in room.  Will continue to monitor.

## 2020-04-27 NOTE — ED Notes (Signed)
Pitcher of water placed with cups of ice

## 2020-04-27 NOTE — Progress Notes (Signed)
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  Outpatient he had received a course of steroids and completed course of Augmentin.  Due to persistent fever had CT chest which showed right lower lobe nodule, left lower lobe nodule and bilateral hilar lymphadenopathy.  Some focal sclerosing lesion in the L1 vertebral body.  Later also developed truncal and bilateral upper and lower extremity rash.  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  ID consulted and started on steroids and RIPE therapy.   Assessment & Plan:   Active Problems:   Fever and chills   Bilateral headaches   Meningitis  Recurrent fevers with headaches.  Suspicion TB meningitis Bilateral hilar lymphadenopathy/mediastinal lymphadenopathy Bilateral pulmonary nodules -Patient has been started on RIPE therapy and steroids -AFB smear and AFB cultures along with routine CSF studies sent -Appreciate input from infectious disease.  Discussed with Dr. Ilsa Iha this morning, recommending airborne precautions for now. 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 -Vancomycin and Rocephin stop -CT chest without contrast 9/27-multifocal mediastinal/hilar adenopathy, calcification of right hilar nodes, granulomatous disease?  3-4 mm stable nodule. -Splenic artery aneurysm calcification   DVT prophylaxis: heparin injection 5,000 Units Start: 04/26/20 1400 Code Status: Full code Family Communication:    Status is: Inpatient  Remains inpatient appropriate because:Inpatient level of care appropriate due to severity of illness   Dispo: The patient is from: Home               Anticipated d/c is to: Home              Anticipated d/c date is: > 3 days              Patient currently is not medically stable to d/c.  Ongoing treatment for TB meningitis.  Maintain hospital stay until cleared by infectious disease.       Body mass index is 23.05 kg/m.      Subjective: After the patient with Spanish translator, translator ID [id number]  Patient denies any complaints at this time including cough, fevers, chills, productive phlegm.  Overall feels great.  Review of Systems Otherwise negative except as per HPI, including: General: Denies fever, chills, night sweats or unintended weight loss. Resp: Denies cough, wheezing, shortness of breath. Cardiac: Denies chest pain, palpitations, orthopnea, paroxysmal nocturnal dyspnea. GI: Denies abdominal pain, nausea, vomiting, diarrhea or constipation GU: Denies dysuria, frequency, hesitancy or incontinence MS: Denies muscle aches, joint pain or swelling Neuro: Denies headache, neurologic deficits (focal weakness, numbness, tingling), abnormal gait Psych: Denies anxiety, depression, SI/HI/AVH Skin: Denies new rashes or lesions ID: Denies sick contacts, exotic exposures, travel  Examination:  General exam: Appears calm and comfortable  Respiratory system: Clear to auscultation. Respiratory effort normal. Cardiovascular system: S1 & S2 heard, RRR. No JVD, murmurs, rubs, gallops or clicks. No pedal edema. Gastrointestinal system: Abdomen is nondistended, soft and nontender. No organomegaly or masses felt. Normal bowel sounds heard. Central nervous system: Alert and oriented. No focal neurological deficits. Extremities: Symmetric 5 x 5 power. Skin: No rashes, lesions or ulcers Psychiatry: Judgement and insight appear normal. Mood & affect appropriate.     Objective: Vitals:   04/27/20 0500 04/27/20  0515 04/27/20 0522 04/27/20 0635  BP:  126/86  (!) 135/95  Pulse:  88  87  Resp:    16  Temp:   98.1 F (36.7 C)  98.7 F (37.1 C)  TempSrc:   Oral Oral  SpO2: 97%   100%  Weight:    79.2 kg  Height:        Intake/Output Summary (Last 24 hours) at 04/27/2020 0812 Last data filed at 04/27/2020 0002 Gross per 24 hour  Intake 1623.95 ml  Output 800 ml  Net 823.95 ml   Filed Weights   04/25/20 1324 04/27/20 0635  Weight: 86.2 kg 79.2 kg     Data Reviewed:   CBC: Recent Labs  Lab 04/21/20 1416 04/25/20 1118 04/26/20 1620  WBC 8.0 4.5 7.5  NEUTROABS 6.2 2.9  --   HGB 14.3 15.1 13.8  HCT 44.2 44.8 41.2  MCV 92.7 93.1 91.4  PLT 196 200 210   Basic Metabolic Panel: Recent Labs  Lab 04/21/20 1416 04/25/20 1118 04/26/20 1620 04/27/20 0438  NA 134* 137 137 135  K 4.1 4.7 4.4 4.7  CL 99 103 99 103  CO2 GLUCOSE 109* 104* 131* 102*  BUN CREATININE 0.83 0.78 0.77 0.68  CALCIUM 9.0 9.4 9.1 9.1  MG  --   --   --  1.9   GFR: Estimated Creatinine Clearance: 129.3 mL/min (by C-G formula based on SCr of 0.68 mg/dL). Liver Function Tests: Recent Labs  Lab 04/21/20 1416 04/25/20 1118 04/26/20 1620 04/27/20 0438  AST 14*  ALT ALKPHOS 36* 35* 38 35*  BILITOT 0.9 0.9 1.7* 1.0  PROT 6.8 7.0 6.4* 6.6  ALBUMIN 3.7 3.8 3.4* 3.5   No results for input(s): LIPASE, AMYLASE in the last 168 hours. No results for input(s): AMMONIA in the last 168 hours. Coagulation Profile: No results for input(s): INR, PROTIME in the last 168 hours. Cardiac Enzymes: No results for input(s): CKTOTAL, CKMB, CKMBINDEX, TROPONINI in the last 168 hours. BNP (last 3 results) No results for input(s): PROBNP in the last 8760 hours. HbA1C: No results for input(s): HGBA1C in the last 72 hours. CBG: No results for input(s): GLUCAP in the last 168 hours. Lipid Profile: No results for input(s): CHOL, HDL, LDLCALC, TRIG, CHOLHDL, LDLDIRECT in the last 72 hours. Thyroid Function Tests: No results for input(s): TSH, T4TOTAL, FREET4, T3FREE, THYROIDAB in the last 72  hours. Anemia Panel: No results for input(s): VITAMINB12, FOLATE, FERRITIN, TIBC, IRON, RETICCTPCT in the last 72 hours. Sepsis Labs: Recent Labs  Lab 04/21/20 1416 04/25/20 1118 04/25/20 1335  LATICACIDVEN 1.4 1.4 1.5    Recent Results (from the past 240 hour(s))  Blood culture (routine x 2)     Status: None   Collection Time: 04/22/20  2:10 AM   Specimen: BLOOD RIGHT HAND  Result Value Ref Range Status   Specimen Description BLOOD RIGHT HAND  Final   Special Requests   Final    BOTTLES DRAWN AEROBIC AND ANAEROBIC Blood Culture adequate volume   Culture   Final    NO GROWTH 5 DAYS Performed at [facility], [address] [place], [place] [address]    Report Status 04/27/2020 FINAL  Final  Blood culture (routine x 2)     Status: None   Collection Time: 04/22/20  2:20 AM   Specimen: BLOOD LEFT ARM  Result Value Ref Range Status  Specimen Description BLOOD LEFT ARM  Final   Special Requests   Final    BOTTLES DRAWN AEROBIC AND ANAEROBIC Blood Culture adequate volume   Culture   Final    NO GROWTH 5 DAYS Performed at [facility], [address] [place], [place] [address]    Report Status 04/27/2020 FINAL  Final  CSF culture     Status: None (Preliminary result)   Collection Time: 04/25/20  4:50 PM   Specimen: CSF; Cerebrospinal Fluid  Result Value Ref Range Status   Specimen Description CSF  Final   Special Requests NONE  Final   Gram Stain   Final    ABUNDANT WBC PRESENT,BOTH PMN AND MONONUCLEAR NO ORGANISMS SEEN    Culture   Final    NO GROWTH < 24 HOURS Performed at [facility], [address], [place], [place] [address]    Report Status PENDING  Incomplete  Culture, blood (routine x 2)     Status: None (Preliminary result)   Collection Time: 04/26/20 12:59 AM   Specimen: BLOOD  Result Value Ref Range Status   Specimen Description BLOOD RIGHT ARM  Final   Special Requests   Final    BOTTLES DRAWN AEROBIC AND ANAEROBIC Blood Culture  adequate volume   Culture   Final    NO GROWTH 1 DAY Performed at [facility], [address] [place], [place] [address]    Report Status PENDING  Incomplete  Culture, blood (routine x 2)     Status: None (Preliminary result)   Collection Time: 04/26/20  1:04 AM   Specimen: BLOOD  Result Value Ref Range Status   Specimen Description BLOOD RIGHT HAND  Final   Special Requests   Final    BOTTLES DRAWN AEROBIC AND ANAEROBIC Blood Culture adequate volume   Culture   Final    NO GROWTH 1 DAY Performed at [facility], [address], [place], [place] [address]    Report Status PENDING  Incomplete         Radiology Studies: MR ANGIO HEAD WO CONTRAST  Result Date: 04/25/2020 CLINICAL DATA:  Neuro deficit, transient right arm weakness with aphasia. EXAM: MR HEAD WITHOUT CONTRAST MR CIRCLE OF WILLIS WITHOUT CONTRAST MRA OF THE NECK WITHOUT AND WITH CONTRAST TECHNIQUE: Multiplanar, multiecho pulse sequences of the brain, circle of willis and surrounding structures were obtained without intravenous contrast. Angiographic images of the neck were obtained using MRA technique without and with intravenous contrast. CONTRAST:  8.52mL GADAVIST GADOBUTROL 1 MMOL/ML IV SOLN COMPARISON:  None. FINDINGS: MR HEAD FINDINGS Brain: T2/FLAIR hyperintense foci involving the right middle cerebellar peduncle (9:7, 6). No diffusion-weighted signal abnormality. No intracranial hemorrhage. No midline shift, ventriculomegaly or extra-axial fluid collection. No mass lesion. Vascular: Please see CTA head. Skull and upper cervical spine: Normal marrow signal. Sinuses/Orbits: Normal orbits. Clear paranasal sinuses. Trace mastoid effusion. Other: Right frontal scalp susceptibility artifact. MR CIRCLE OF WILLIS FINDINGS Anterior circulation: The bilateral internal carotid, anterior and middle cerebral arteries are patent and normal caliber. No significant stenosis, proximal occlusion, aneurysm, or vascular  malformation. Posterior circulation: Dominant left vertebral artery. Patent bilateral PICA. Dominant right AICA. Normal caliber patent basilar, superior cerebellar and posterior cerebral arteries. No significant stenosis, proximal occlusion, aneurysm, or vascular malformation. Venous sinuses: No evidence of thrombosis. Anatomic variants: Bilateral PCOM hypoplasia. MRA NECK FINDINGS There is no high-grade narrowing or focal aneurysm involving the bilateral carotid arteries. No evidence of dissection. The  bilateral vertebral arteries are patent and demonstrate antegrade flow. Dominant left vertebral artery. No evidence of high-grade narrowing or focal aneurysm. Three vessel aortic arch. IMPRESSION: MRI HEAD: T2/FLAIR hyperintense foci involving the right middle cerebellar peduncle are suspicious for demyelination. Consider postcontrast imaging for further evaluation. MRA HEAD: Normal MRA head. MRA NECK: Normal MRA neck. Electronically Signed   By: Stana Bunting M.D.   On: 04/25/2020 16:10   MR Angiogram Neck W or Wo Contrast  Result Date: 04/25/2020 CLINICAL DATA:  Neuro deficit, transient right arm weakness with aphasia. EXAM: MR HEAD WITHOUT CONTRAST MR CIRCLE OF WILLIS WITHOUT CONTRAST MRA OF THE NECK WITHOUT AND WITH CONTRAST TECHNIQUE: Multiplanar, multiecho pulse sequences of the brain, circle of willis and surrounding structures were obtained without intravenous contrast. Angiographic images of the neck were obtained using MRA technique without and with intravenous contrast. CONTRAST:  8.56mL GADAVIST GADOBUTROL 1 MMOL/ML IV SOLN COMPARISON:  None. FINDINGS: MR HEAD FINDINGS Brain: T2/FLAIR hyperintense foci involving the right middle cerebellar peduncle (9:7, 6). No diffusion-weighted signal abnormality. No intracranial hemorrhage. No midline shift, ventriculomegaly or extra-axial fluid collection. No mass lesion. Vascular: Please see CTA head. Skull and upper cervical spine: Normal marrow signal.  Sinuses/Orbits: Normal orbits. Clear paranasal sinuses. Trace mastoid effusion. Other: Right frontal scalp susceptibility artifact. MR CIRCLE OF WILLIS FINDINGS Anterior circulation: The bilateral internal carotid, anterior and middle cerebral arteries are patent and normal caliber. No significant stenosis, proximal occlusion, aneurysm, or vascular malformation. Posterior circulation: Dominant left vertebral artery. Patent bilateral PICA. Dominant right AICA. Normal caliber patent basilar, superior cerebellar and posterior cerebral arteries. No significant stenosis, proximal occlusion, aneurysm, or vascular malformation. Venous sinuses: No evidence of thrombosis. Anatomic variants: Bilateral PCOM hypoplasia. MRA NECK FINDINGS There is no high-grade narrowing or focal aneurysm involving the bilateral carotid arteries. No evidence of dissection. The bilateral vertebral arteries are patent and demonstrate antegrade flow. Dominant left vertebral artery. No evidence of high-grade narrowing or focal aneurysm. Three vessel aortic arch. IMPRESSION: MRI HEAD: T2/FLAIR hyperintense foci involving the right middle cerebellar peduncle are suspicious for demyelination. Consider postcontrast imaging for further evaluation. MRA HEAD: Normal MRA head. MRA NECK: Normal MRA neck. Electronically Signed   By: Stana Bunting M.D.   On: 04/25/2020 16:10   MR BRAIN WO CONTRAST  Result Date: 04/25/2020 CLINICAL DATA:  Neuro deficit, transient right arm weakness with aphasia. EXAM: MR HEAD WITHOUT CONTRAST MR CIRCLE OF WILLIS WITHOUT CONTRAST MRA OF THE NECK WITHOUT AND WITH CONTRAST TECHNIQUE: Multiplanar, multiecho pulse sequences of the brain, circle of willis and surrounding structures were obtained without intravenous contrast. Angiographic images of the neck were obtained using MRA technique without and with intravenous contrast. CONTRAST:  8.32mL GADAVIST GADOBUTROL 1 MMOL/ML IV SOLN COMPARISON:  None. FINDINGS: MR HEAD  FINDINGS Brain: T2/FLAIR hyperintense foci involving the right middle cerebellar peduncle (9:7, 6). No diffusion-weighted signal abnormality. No intracranial hemorrhage. No midline shift, ventriculomegaly or extra-axial fluid collection. No mass lesion. Vascular: Please see CTA head. Skull and upper cervical spine: Normal marrow signal. Sinuses/Orbits: Normal orbits. Clear paranasal sinuses. Trace mastoid effusion. Other: Right frontal scalp susceptibility artifact. MR CIRCLE OF WILLIS FINDINGS Anterior circulation: The bilateral internal carotid, anterior and middle cerebral arteries are patent and normal caliber. No significant stenosis, proximal occlusion, aneurysm, or vascular malformation. Posterior circulation: Dominant left vertebral artery. Patent bilateral PICA. Dominant right AICA. Normal caliber patent basilar, superior cerebellar and posterior cerebral arteries. No significant stenosis, proximal occlusion, aneurysm, or vascular malformation. Venous sinuses: No  evidence of thrombosis. Anatomic variants: Bilateral PCOM hypoplasia. MRA NECK FINDINGS There is no high-grade narrowing or focal aneurysm involving the bilateral carotid arteries. No evidence of dissection. The bilateral vertebral arteries are patent and demonstrate antegrade flow. Dominant left vertebral artery. No evidence of high-grade narrowing or focal aneurysm. Three vessel aortic arch. IMPRESSION: MRI HEAD: T2/FLAIR hyperintense foci involving the right middle cerebellar peduncle are suspicious for demyelination. Consider postcontrast imaging for further evaluation. MRA HEAD: Normal MRA head. MRA NECK: Normal MRA neck. Electronically Signed   By: Stana Buntinghikanele  Emekauwa M.D.   On: 04/25/2020 16:10   MR BRAIN W CONTRAST  Result Date: 04/26/2020 CLINICAL DATA:  Follow-up examination for demyelinating disease, headaches, fevers. EXAM: MRI HEAD WITH CONTRAST TECHNIQUE: Multiplanar, multiecho pulse sequences of the brain and surrounding  structures were obtained with intravenous contrast. CONTRAST:  7.115mL GADAVIST GADOBUTROL 1 MMOL/ML IV SOLN COMPARISON:  Prior brain MRI from 04/25/2020. FINDINGS: Thin-section FLAIR sequences were performed prior to the administration of IV contrast. Images demonstrate diffuse abnormal FLAIR signal intensity involving the cortical sulci/leptomeningeal both cerebral hemispheres, most pronounced at the parieto-occipital regions, fairly symmetric in nature. Additionally, abnormal subtle abnormal leptomeningeal FLAIR signal intensity seen within the right greater than left cerebellum. Patchy parenchymal signal abnormality noted at the right middle cerebellar peduncle, corresponding with abnormality on prior brain MRI. Abnormal FLAIR signal extends to involve the right 7/8 complex and right internal auditory canal, with probable involvement of the right inner ear structures as well (series 3, image 39). Suspected trace intraventricular debris (series 3, image 77). Following contrast administration, scattered leptomeningeal enhancement seen about the right greater than left cerebellum. Mild patchy parenchymal enhancement noted at the level of the right middle cerebellar peduncle signal abnormality (series 5, image 16). Additionally, prominent leptomeningeal and/or dural enhancement noted at the right CP angle cistern, extending into the right IAC (series 5, image 15). No restricted diffusion seen at the level of the right CP angle cistern on prior brain MRI to suggest abscess at this location. Extension to involve the upper cervical spinal cord again noted, described on corresponding cervical spine MRI. Subtle leptomeningeal enhancement noted about the parieto-occipital regions as well. Given the history of in fevers, headaches, and abnormal CSF values, findings most concerning for acute meningitis with associated cerebritis at the right middle cerebellar peduncle. No frank abscess or other collection. Normal  opacification of the major dural sinuses noted. No appreciable ependymal enhancement to suggest associated ventriculitis. 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 Given the basilar predilection, atypical etiologies including TB and/or fungal meningitis could be considered. No discrete abscess, hydrocephalus, or other complication at this time. Electronically Signed   By: Rise MuBenjamin  McClintock M.D.   On: 04/26/2020 01:37   MR CERVICAL SPINE W WO CONTRAST  Addendum Date: 04/26/2020   ADDENDUM REPORT: 04/26/2020 01:15 ADDENDUM: Upon further consideration and review of the corresponding postcontrast brain MRI, note is made of subtle circumferential leptomeningeal enhancement about the upper cervical spinal cord, extending from the cervicomedullary region to approximately C2-3. Associated leptomeningeal enhancement noted about the brainstem and cerebellum, better evaluated on corresponding brain MRI. Given the history of fevers, headaches, and abnormal CSF values, findings most concerning for acute infection/meningitis. No associated cord edema or frank intramedullary enhancement. Electronically Signed   By: Rise MuBenjamin  McClintock M.D.   On: 04/26/2020 01:15   Result Date: 04/26/2020 CLINICAL DATA:  Initial evaluation for demyelinating disease. EXAM: MRI CERVICAL SPINE  WITHOUT AND WITH CONTRAST TECHNIQUE: Multiplanar and multiecho pulse sequences of the cervical spine, to include the craniocervical junction and cervicothoracic junction, were obtained without and with intravenous contrast. CONTRAST:  7.35mL GADAVIST GADOBUTROL 1 MMOL/ML IV SOLN COMPARISON:  Prior brain MRI from 04/25/2020. FINDINGS: Alignment: Physiologic with preservation of the normal cervical lordosis. No listhesis. Vertebrae: 1 cm T2/stir hyperintense lesion within the C6 vertebral body most likely reflects an atypical hemangioma. Associated  minimal central vertebral body height loss. Additional small benign hemangioma noted within the C7 vertebral body. No other discrete or worrisome osseous lesions. Underlying bone marrow signal intensity within normal limits. Vertebral body height otherwise maintained without acute or chronic fracture. No other abnormal marrow edema or enhancement. Cord: Normal signal and morphology. No cord signal changes to suggest demyelinating disease. No abnormal enhancement. Posterior Fossa, vertebral arteries, paraspinal tissues: Negative. Disc levels: C2-C3: Unremarkable. C3-C4: Minimal annular disc bulge with uncovertebral hypertrophy. No significant spinal stenosis. Foramina remain patent. C4-C5: Mild annular disc bulge with uncovertebral hypertrophy. No significant spinal stenosis. Foramina remain patent. C5-C6: Disc desiccation with mild annular disc bulge. No spinal stenosis. Foramina remain patent. C6-C7: Disc desiccation without significant disc bulge. No canal or foraminal stenosis. No impingement. C7-T1:  Unremarkable. Visualized upper thoracic spine demonstrates no significant finding. IMPRESSION: 1. Normal MRI of the cervical spinal cord. No evidence for demyelinating disease. No abnormal enhancement. 2. 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 No significant spinal stenosis or neural foraminal narrowing. Electronically Signed: By: Rise Mu M.D. On: 04/26/2020 00:59   DG Chest Portable 1 View  Result Date: 04/25/2020 CLINICAL DATA:  Weakness. EXAM: PORTABLE CHEST 1 VIEW COMPARISON:  April 22, 2020 FINDINGS: The cardiac silhouette is normal. There is bilateral hilar lymphadenopathy similar to the prior studies There is no evidence of focal airspace consolidation, pleural effusion or pneumothorax. Osseous structures are without acute abnormality. Soft tissues are grossly normal. IMPRESSION: Bilateral hilar lymphadenopathy similar to the prior studies. Electronically Signed   By: Ted Mcalpine M.D.   On: 04/25/2020 12:14        Scheduled Meds: . dexamethasone  12 mg Oral Q breakfast  . ethambutol  1,500 mg Oral Daily  . heparin  5,000 Units Subcutaneous Q8H  . isoniazid  300 mg Oral Daily  . pyrazinamide  2,000 mg Oral Daily  . vitamin B-6  50 mg Oral Daily  . rifampin  600 mg Oral Daily   Continuous Infusions: . sodium chloride 100 mL/hr at 04/27/20 0047  . cefTRIAXone (ROCEPHIN)  IV Stopped (04/27/20 0046)  . vancomycin 200 mL/hr at 04/27/20 0053     LOS: 1 day   Time spent= 35 mins    Shaneeka Scarboro Joline Maxcy, MD [facility]  If 7PM-7AM, please contact night-coverage  04/27/2020, 8:12 AM

## 2020-04-28 DIAGNOSIS — G039 Meningitis, unspecified: Secondary | ICD-10-CM

## 2020-04-28 LAB — COMPREHENSIVE METABOLIC PANEL
ALT: 23 U/L (ref 0–44)
AST: 12 U/L — ABNORMAL LOW (ref 15–41)
Albumin: 3.5 g/dL (ref 3.5–5.0)
Alkaline Phosphatase: 39 U/L (ref 38–126)
Anion gap: 7 (ref 5–15)
BUN: 11 mg/dL (ref 6–20)
CO2: 26 mmol/L (ref 22–32)
Calcium: 9.1 mg/dL (ref 8.9–10.3)
Chloride: 101 mmol/L (ref 98–111)
Creatinine, Ser: 0.9 mg/dL (ref 0.61–1.24)
GFR calc Af Amer: >60 mL/min (ref >60)
GFR calc non Af Amer: >60 mL/min (ref >60)
Glucose, Bld: 98 mg/dL (ref 70–99)
Potassium: 4.6 mmol/L (ref 3.5–5.1)
Sodium: 134 mmol/L — ABNORMAL LOW (ref 135–145)
Total Bilirubin: 1 mg/dL (ref 0.3–1.2)
Total Protein: 6.6 g/dL (ref 6.5–8.1)

## 2020-04-28 LAB — MAGNESIUM: Magnesium: 1.8 mg/dL (ref 1.7–2.4)

## 2020-04-28 NOTE — Progress Notes (Signed)
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  Outpatient he had received a course of steroids and completed course of Augmentin.  Due to persistent fever had CT chest which showed right lower lobe nodule, left lower lobe nodule and bilateral hilar lymphadenopathy.  Some focal sclerosing lesion in the L1 vertebral body.  Later also developed truncal and bilateral upper and lower extremity rash.  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  ID consulted and started on steroids and RIPE therapy.   Assessment & Plan:   Active Problems:   Fever and chills   Bilateral headaches   Meningitis  Recurrent fevers with headaches.  Suspicion TB meningitis Bilateral hilar lymphadenopathy/mediastinal lymphadenopathy Bilateral pulmonary nodules -Patient has been started on RIPE therapy and steroids -CSF studies-for AFB-pending -Appreciate input from infectious disease.  Discussed with Dr. Graylon Good this morning, recommending airborne precautions for now. 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 -CT chest without contrast 9/27-multifocal mediastinal/hilar adenopathy, calcification of right hilar nodes, granulomatous disease?  3-4 mm stable nodule.  Discussed with pulmonary, Dr Lake Bells suggested to wait for CSF results to come back prior to considering bronc for BAL/EBUS. -Splenic artery aneurysm calcification   DVT prophylaxis: heparin injection 5,000 Units Start: 04/26/20 1400 Code Status: Full code Family Communication:    Status is: Inpatient  Remains inpatient appropriate because:Inpatient level of care appropriate due to severity of  illness   Dispo: The patient is from: Home              Anticipated d/c is to: Home              Anticipated d/c date is: > 3 days              Patient currently is not medically stable to d/c.  Ongoing treatment for TB meningitis.  Maintain hospital stay until cleared by infectious disease.   Body mass index is 23.05 kg/m.  Subjective: No complaints feels okay.  Denies any fevers, chills, cough, shortness of breath.  Review of Systems Otherwise negative except as per HPI, including: General: Denies fever, chills, night sweats or unintended weight loss. Resp: Denies cough, wheezing, shortness of breath. Cardiac: Denies chest pain, palpitations, orthopnea, paroxysmal nocturnal dyspnea. GI: Denies abdominal pain, nausea, vomiting, diarrhea or constipation GU: Denies dysuria, frequency, hesitancy or incontinence MS: Denies muscle aches, joint pain or swelling Neuro: Denies headache, neurologic deficits (focal weakness, numbness, tingling), abnormal gait Psych: Denies anxiety, depression, SI/HI/AVH Skin: Denies new rashes or lesions ID: Denies sick contacts, exotic exposures, travel  Examination:  Constitutional: Not in acute distress Respiratory: Clear to auscultation bilaterally Cardiovascular: Normal sinus rhythm, no rubs Abdomen: Nontender nondistended good bowel sounds Musculoskeletal: No edema noted Skin: No rashes seen Neurologic: CN 2-12 grossly intact.  And nonfocal Psychiatric: Normal judgment and insight. Alert and oriented x 3. Normal mood.   Objective: Vitals:   04/27/20 2321 04/28/20 0339 04/28/20 0931 04/28/20 0942  BP: 129/84 133/85 130/86   Pulse: 73 84 100   Resp: _0 Temp: 98.7 F (37.1 C) 98.3 F (36.8 C) (!) 97.4 F (36.3 C) 98.1 F (36.7 C)  TempSrc:   Oral Oral  SpO2:  96% 99% 98%   Weight:      Height:        Intake/Output Summary (Last 24 hours) at 04/28/2020 1226 Last data filed at 04/28/2020 0954 Gross per 24 hour  Intake 360 ml    Output 0 ml  Net 360 ml   Filed Weights   04/25/20 1324 04/27/20 0635  Weight: 86.2 kg 79.2 kg     Data Reviewed:   CBC: Recent Labs  Lab 04/21/20 1416 04/25/20 1118 04/26/20 1620  WBC 8.0 4.5 7.5  NEUTROABS 6.2 2.9  --   HGB 14.3 15.1 13.8  HCT 44.2 44.8 41.2  MCV 92.7 93.1 91.4  PLT 196 200 010   Basic Metabolic Panel: Recent Labs  Lab 04/21/20 1416 04/25/20 1118 04/26/20 1620 04/27/20 0438 04/28/20 0736  NA 134* 137 137 135 134*  K 4.1 4.7 4.4 4.7 4.6  CL 99 103 99 103 101  CO2 _0 GLUCOSE 109* 104* 131* 102* 98  BUN _1 CREATININE 0.83 0.78 0.77 0.68 0.90  CALCIUM 9.0 9.4 9.1 9.1 9.1  MG  --   --   --  1.9 1.8   GFR: Estimated Creatinine Clearance: 114.9 mL/min (by C-G formula based on SCr of 0.9 mg/dL). Liver Function Tests: Recent Labs  Lab 04/21/20 1416 04/25/20 1118 04/26/20 1620 04/27/20 0438 04/28/20 0736  AST _2 14* 12*  ALT _3 ALKPHOS 36* 35* 38 35* 39  BILITOT 0.9 0.9 1.7* 1.0 1.0  PROT 6.8 7.0 6.4* 6.6 6.6  ALBUMIN 3.7 3.8 3.4* 3.5 3.5   No results for input(s): LIPASE, AMYLASE in the last 168 hours. No results for input(s): AMMONIA in the last 168 hours. Coagulation Profile: No results for input(s): INR, PROTIME in the last 168 hours. Cardiac Enzymes: No results for input(s): CKTOTAL, CKMB, CKMBINDEX, TROPONINI in the last 168 hours. BNP (last 3 results) No results for input(s): PROBNP in the last 8760 hours. HbA1C: No results for input(s): HGBA1C in the last 72 hours. CBG: No results for input(s): GLUCAP in the last 168 hours. Lipid Profile: No results for input(s): CHOL, HDL, LDLCALC, TRIG, CHOLHDL, LDLDIRECT in the last 72 hours. Thyroid Function Tests: No results for input(s): TSH, T4TOTAL, FREET4, T3FREE, THYROIDAB in the last 72 hours. Anemia Panel: No results for input(s): VITAMINB12, FOLATE, FERRITIN, TIBC, IRON, RETICCTPCT in the last 72 hours. Sepsis Labs: Recent Labs   Lab 04/21/20 1416 04/25/20 1118 04/25/20 1335  LATICACIDVEN 1.4 1.4 1.5    Recent Results (from the past 240 hour(s))  Blood culture (routine x 2)     Status: None   Collection Time: 04/22/20  2:10 AM   Specimen: BLOOD RIGHT HAND  Result Value Ref Range Status   Specimen Description BLOOD RIGHT HAND  Final   Special Requests   Final    BOTTLES DRAWN AEROBIC AND ANAEROBIC Blood Culture adequate volume   Culture   Final    NO GROWTH 5 DAYS Performed at [facility], [address], [place], [place] [address]    Report Status 04/27/2020 FINAL  Final  Blood culture (routine x 2)     Status: None   Collection Time: 04/22/20  2:20 AM   Specimen: BLOOD LEFT ARM  Result Value Ref Range Status   Specimen Description BLOOD LEFT ARM  Final   Special Requests   Final    BOTTLES DRAWN AEROBIC AND ANAEROBIC Blood  Culture adequate volume   Culture   Final    NO GROWTH 5 DAYS Performed at [facility], [place] [address], [place], [place] [address]    Report Status 04/27/2020 FINAL  Final  CSF culture     Status: None (Preliminary result)   Collection Time: 04/25/20  4:50 PM   Specimen: CSF; Cerebrospinal Fluid  Result Value Ref Range Status   Specimen Description CSF  Final   Special Requests NONE  Final   Gram Stain   Final    ABUNDANT WBC PRESENT,BOTH PMN AND MONONUCLEAR NO ORGANISMS SEEN    Culture   Final    NO GROWTH 3 DAYS Performed at [facility], [place] [address], [place], [place] [address]    Report Status PENDING  Incomplete  Culture, fungus without smear     Status: None (Preliminary result)   Collection Time: 04/25/20  4:50 PM   Specimen: CSF; Cerebrospinal Fluid  Result Value Ref Range Status   Specimen Description CSF  Final   Special Requests NONE  Final   Culture   Final    NO FUNGUS ISOLATED AFTER 1 DAY Performed at [facility], [place] [address] [place], [place] [address]    Report Status PENDING  Incomplete  Culture, blood (routine x  2)     Status: None (Preliminary result)   Collection Time: 04/26/20 12:59 AM   Specimen: BLOOD  Result Value Ref Range Status   Specimen Description BLOOD RIGHT ARM  Final   Special Requests   Final    BOTTLES DRAWN AEROBIC AND ANAEROBIC Blood Culture adequate volume   Culture   Final    NO GROWTH 2 DAYS Performed at [facility], [place] [address], [place], [place] [address]    Report Status PENDING  Incomplete  Culture, blood (routine x 2)     Status: None (Preliminary result)   Collection Time: 04/26/20  1:04 AM   Specimen: BLOOD  Result Value Ref Range Status   Specimen Description BLOOD RIGHT HAND  Final   Special Requests   Final    BOTTLES DRAWN AEROBIC AND ANAEROBIC Blood Culture adequate volume   Culture   Final    NO GROWTH 2 DAYS Performed at [facility], [place] [address] [place], [place] [address]    Report Status PENDING  Incomplete         Radiology Studies: CT CHEST WO CONTRAST  Result Date: 04/27/2020 CLINICAL DATA:  Adenopathy on recent chest radiograph EXAM: CT CHEST WITHOUT CONTRAST TECHNIQUE: Multidetector CT imaging of the chest was performed following the standard protocol without IV contrast. COMPARISON:  Chest radiograph April 25, 2020 and chest CT April 10, 2020. FINDINGS: Cardiovascular: There is no evident thoracic aortic aneurysm. Visualized great vessels appear unremarkable noncontrast enhanced study. There is no pericardial effusion or pericardial thickening. Mediastinum/Nodes: Thyroid appears normal. Adenopathy is again noted at multiple sites. Multiple lymph nodes in the paratracheal regions are noted. A lymph node slightly to the right of the trachea anteriorly measures 1.3 x 1.3 cm. A lymph node anterior to the carina on the right measures 1.7 x 1.2 cm. A lymph node in the aortopulmonary window region measures 1.5 x 1.1 cm. A lymph node to the left of the aortic arch measures 1.9 x 1.5 cm. There are hilar lymph nodes bilaterally,  less than optimally discerned due to lack of contrast. A lymph node in the left hilar region measures 1.8 x 1.3  cm. A lymph node in the right hilar region measures 1.8 x 1.3 cm. Several other mildly prominent hilar lymph nodes are noted. There are enlarged subcarinal lymph nodes, largest measuring 2.4 x 1 1.8 cm, marginally larger than on most recent chest CT examination. Occasional calcification noted in hilar lymph nodes on the right. No other lymph node calcification is appreciable. Lungs/Pleura: There is no edema or airspace opacity. There is a stable 3 mm nodular opacity in the anterior segment of the left lobe seen on axial slice 94 series 4. A 4 mm nodular opacity in the lateral segment right lower lobe is stable, seen on axial slice 923 series 4. No pleural effusions evident. There are scattered areas of mild atelectatic change in the left lower lobe. Upper Abdomen: There is a focal aneurysm arising from the splenic artery measuring 2.0 x 1.5 cm with peripheral calcification, stable. Visualized upper abdominal structures otherwise appear unremarkable on this noncontrast study. Musculoskeletal: There is a bone island in the lateral aspect of the L1 vertebral body. No blastic or lytic bone lesions evident. No chest wall lesions. IMPRESSION: 1. Multifocal mediastinal and hilar adenopathy again noted with a subcarinal lymph node slightly larger than on recent CT. Other areas of adenopathy appear essentially stable. Adenopathy of this nature may be seen with granulomatous disease such as tuberculosis as a primary manifestation. Note that neoplasm must be of concern given this finding as well; neoplastic etiology for adenopathy is not excluded on this study. Calcification in right hilar lymph nodes does suggest prior granulomatous disease. This finding may warrant thoracic surgery consultation for potential central lymph node sampling. 2. Stable 3-4 mm nodular opacity on each side. No edema or airspace opacity no  pleural effusions. Mild atelectatic change in the left lower lobe must be considered nonspecific. 3. 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 Clinical significance of this finding questionable. Electronically Signed   By: Lowella Grip III M.D.   On: 04/27/2020 11:11        Scheduled Meds: . dexamethasone  4 mg Oral Q8H  . ethambutol  1,600 mg Oral Daily  . heparin  5,000 Units Subcutaneous Q8H  . isoniazid  300 mg Oral Daily  . pyrazinamide  2,000 mg Oral Daily  . vitamin B-6  50 mg Oral Daily  . rifampin  600 mg Oral Daily   Continuous Infusions:    LOS: 2 days   Time spent= 35 mins    Allye Hoyos Arsenio Loader, MD [facility]  If 7PM-7AM, please contact night-coverage  04/28/2020, 12:26 PM

## 2020-04-28 NOTE — Progress Notes (Signed)
[name]   Reason for visit: Follow up on meningitis  Interval History: no positive cultures yet.  No BAL planned per pulmonology. He is having no cough, no sob.  He has not had any recent issues with cough or sob.  CT of the chest repeated and has continued adenopathy.   He had the BCG vaccine as a child.    Physical Exam: Constitutional:  Vitals:   04/28/20 0942 04/28/20 1726  BP:  118/81  Pulse:  98  Resp:  18  Temp: 98.1 F (36.7 C) 99.5 F (37.5 C)  SpO2:  97%   patient appears in NAD Respiratory: Normal respiratory effort; CTA B Cardiovascular: RRR GI: soft, nt, nd Skin: small erythematous papules on each side of his torso  Review of Systems: Constitutional: negative for fevers, chills, sweats, malaise and anorexia Respiratory: negative for cough, sputum, pleurisy/chest pain or dyspnea on exertion Gastrointestinal: negative for nausea and diarrhea Integument/breast: positive for rash - no itch  Lab Results  Component Value Date   WBC 7.5 04/26/2020   HGB 13.8 04/26/2020   HCT 41.2 04/26/2020   MCV 91.4 04/26/2020   PLT 210 04/26/2020    Lab Results  Component Value Date   CREATININE 0.90 04/28/2020   BUN 11 04/28/2020   NA 134 (L) 04/28/2020   K 4.6 04/28/2020   CL 101 04/28/2020   CO2 26 04/28/2020    Lab Results  Component Value Date   ALT 23 04/28/2020   AST 12 (L) 04/28/2020   ALKPHOS 39 04/28/2020     Microbiology: Recent Results (from the past 240 hour(s))  Blood culture (routine x 2)     Status: None   Collection Time: 04/22/20  2:10 AM   Specimen: BLOOD RIGHT HAND  Result Value Ref Range Status   Specimen Description BLOOD RIGHT HAND  Final   Special Requests   Final    BOTTLES DRAWN AEROBIC AND ANAEROBIC Blood Culture adequate volume   Culture   Final    NO GROWTH 5 DAYS Performed at [facility], [address] [place], [place] [address]    Report Status 04/27/2020 FINAL  Final  Blood  culture (routine x 2)     Status: None   Collection Time: 04/22/20  2:20 AM   Specimen: BLOOD LEFT ARM  Result Value Ref Range Status   Specimen Description BLOOD LEFT ARM  Final   Special Requests   Final    BOTTLES DRAWN AEROBIC AND ANAEROBIC Blood Culture adequate volume   Culture   Final    NO GROWTH 5 DAYS Performed at [facility], [place] [address] [place], [place] [address]    Report Status 04/27/2020 FINAL  Final  CSF culture     Status: None (Preliminary result)   Collection Time: 04/25/20  4:50 PM   Specimen: CSF; Cerebrospinal Fluid  Result Value Ref Range Status   Specimen Description CSF  Final   Special Requests NONE  Final   Gram Stain   Final    ABUNDANT WBC PRESENT,BOTH PMN AND MONONUCLEAR NO ORGANISMS SEEN    Culture   Final    NO GROWTH 3 DAYS Performed at [facility], [place] [address] [place], [place] [address]    Report Status PENDING  Incomplete  Culture, fungus without smear     Status: None (Preliminary result)   Collection Time: 04/25/20  4:50 PM   Specimen: CSF; Cerebrospinal Fluid  Result Value Ref Range Status   Specimen Description CSF  Final   Special Requests NONE  Final   Culture   Final    NO FUNGUS ISOLATED AFTER 2 DAYS Performed at [facility], [address] [place], [place] [address]    Report Status PENDING  Incomplete  Culture, blood (routine x 2)     Status: None (Preliminary result)   Collection Time: 04/26/20 12:59 AM   Specimen: BLOOD  Result Value Ref Range Status   Specimen Description BLOOD RIGHT ARM  Final   Special Requests   Final    BOTTLES DRAWN AEROBIC AND ANAEROBIC Blood Culture adequate volume   Culture   Final    NO GROWTH 2 DAYS Performed at [facility], [place] [address] [place], [place] [address]    Report Status PENDING  Incomplete  Culture, blood (routine x 2)     Status: None (Preliminary result)   Collection Time: 04/26/20  1:04 AM   Specimen: BLOOD  Result Value Ref Range Status    Specimen Description BLOOD RIGHT HAND  Final   Special Requests   Final    BOTTLES DRAWN AEROBIC AND ANAEROBIC Blood Culture adequate volume   Culture   Final    NO GROWTH 2 DAYS Performed at [facility], [place] [address] [place], [place] [address]    Report Status PENDING  Incomplete    Impression/Plan:  1. Meningitis - broad differential and likely atypical such as Tb or fungal based on CSF findings, MRI.  He is on RIPE for possible Tb along with steroids.  He continues to feel much improved and near his baseline.   I have sent off TB PCR from the CSF. Quantiferon gold pending  2.  Adenopathy - likely related to #1 and Tb is part of the differential.  If no obvious etiology, will consider biopsy of one of the lymph nodes for diagnosis.

## 2020-04-28 NOTE — Plan of Care (Signed)
  Problem: Education: Goal: Knowledge of General Education information will improve Description Including pain rating scale, medication(s)/side effects and non-pharmacologic comfort measures Outcome: Progressing   

## 2020-04-28 NOTE — Plan of Care (Signed)
  Problem: Activity: Goal: Risk for activity intolerance will decrease Outcome: Completed/Met

## 2020-04-28 NOTE — Plan of Care (Signed)
  Problem: Education: Goal: Knowledge of General Education information will improve Description: Including pain rating scale, medication(s)/side effects and non-pharmacologic comfort measures Outcome: Progressing   Problem: Clinical Measurements: Goal: Ability to maintain clinical measurements within normal limits will improve Outcome: Progressing Goal: Respiratory complications will improve Outcome: Progressing   

## 2020-04-29 DIAGNOSIS — R590 Localized enlarged lymph nodes: Secondary | ICD-10-CM

## 2020-04-29 DIAGNOSIS — R519 Headache, unspecified: Secondary | ICD-10-CM

## 2020-04-29 LAB — COMPREHENSIVE METABOLIC PANEL
ALT: 25 U/L (ref 0–44)
AST: 13 U/L — ABNORMAL LOW (ref 15–41)
Albumin: 3.6 g/dL (ref 3.5–5.0)
Alkaline Phosphatase: 36 U/L — ABNORMAL LOW (ref 38–126)
Anion gap: 9 (ref 5–15)
BUN: 9 mg/dL (ref 6–20)
CO2: 27 mmol/L (ref 22–32)
Calcium: 9.5 mg/dL (ref 8.9–10.3)
Chloride: 96 mmol/L — ABNORMAL LOW (ref 98–111)
Creatinine, Ser: 0.91 mg/dL (ref 0.61–1.24)
GFR calc Af Amer: >60 mL/min (ref >60)
GFR calc non Af Amer: >60 mL/min (ref >60)
Glucose, Bld: 110 mg/dL — ABNORMAL HIGH (ref 70–99)
Potassium: 4.5 mmol/L (ref 3.5–5.1)
Sodium: 132 mmol/L — ABNORMAL LOW (ref 135–145)
Total Bilirubin: 1 mg/dL (ref 0.3–1.2)
Total Protein: 6.8 g/dL (ref 6.5–8.1)

## 2020-04-29 LAB — QUANTIFERON-TB GOLD PLUS: QuantiFERON-TB Gold Plus: NEGATIVE

## 2020-04-29 LAB — CSF CULTURE W GRAM STAIN: Culture: NO GROWTH

## 2020-04-29 LAB — QUANTIFERON-TB GOLD PLUS (RQFGPL)
QuantiFERON Mitogen Value: 0.75 IU/mL
QuantiFERON Nil Value: 0.09 IU/mL
QuantiFERON TB1 Ag Value: 0.11 IU/mL
QuantiFERON TB2 Ag Value: 0.12 IU/mL

## 2020-04-29 LAB — MAGNESIUM: Magnesium: 2 mg/dL (ref 1.7–2.4)

## 2020-04-29 LAB — MYELIN BASIC PROTEIN, CSF: Myelin Basic Protein: 3 ng/mL (ref 0.0–4.7)

## 2020-04-29 NOTE — Progress Notes (Signed)
[facility]   Reason for visit: Follow up on meningitis  Interval History: no cough, no sob.  Wife and daughter at bedside.  No fever. Family asking about Chad Nile as a cause.   Day 4 RIPE   Physical Exam: Constitutional:  Vitals:   04/29/20 0637 04/29/20 0904  BP: 127/79 125/80  Pulse: 94 98  Resp: 19 20  Temp: 98.7 F (37.1 C) 98.5 F (36.9 C)  SpO2: 99% 97%   patient appears in NAD Respiratory: Normal respiratory effort; CTA B Cardiovascular: RRR GI: soft, nt, nd  Review of Systems: Constitutional: negative for fevers, chills, sweats, malaise and anorexia Respiratory: negative for cough, sputum, pleurisy/chest pain or dyspnea on exertion Gastrointestinal: negative for nausea and diarrhea Integument/breast: positive for rash - no itch  Lab Results  Component Value Date   WBC 7.5 04/26/2020   HGB 13.8 04/26/2020   HCT 41.2 04/26/2020   MCV 91.4 04/26/2020   PLT 210 04/26/2020    Lab Results  Component Value Date   CREATININE 0.90 04/28/2020   BUN 11 04/28/2020   NA 134 (L) 04/28/2020   K 4.6 04/28/2020   CL 101 04/28/2020   CO2 26 04/28/2020    Lab Results  Component Value Date   ALT 23 04/28/2020   AST 12 (L) 04/28/2020   ALKPHOS 39 04/28/2020     Microbiology: Recent Results (from the past 240 hour(s))  Blood culture (routine x 2)     Status: None   Collection Time: 04/22/20  2:10 AM   Specimen: BLOOD RIGHT HAND  Result Value Ref Range Status   Specimen Description BLOOD RIGHT HAND  Final   Special Requests   Final    BOTTLES DRAWN AEROBIC AND ANAEROBIC Blood Culture adequate volume   Culture   Final    NO GROWTH 5 DAYS Performed at [facility], [address] [place], [place] [address]    Report Status 04/27/2020 FINAL  Final  Blood culture (routine x 2)     Status: None   Collection Time: 04/22/20  2:20 AM   Specimen: BLOOD LEFT ARM  Result Value Ref Range Status   Specimen Description BLOOD LEFT ARM   Final   Special Requests   Final    BOTTLES DRAWN AEROBIC AND ANAEROBIC Blood Culture adequate volume   Culture   Final    NO GROWTH 5 DAYS Performed at [facility], [address], [place], [place] [address]    Report Status 04/27/2020 FINAL  Final  CSF culture     Status: None   Collection Time: 04/25/20  4:50 PM   Specimen: CSF; Cerebrospinal Fluid  Result Value Ref Range Status   Specimen Description CSF  Final   Special Requests NONE  Final   Gram Stain   Final    ABUNDANT WBC PRESENT,BOTH PMN AND MONONUCLEAR NO ORGANISMS SEEN    Culture   Final    NO GROWTH Performed at [facility], [address], [place], [place] [address]    Report Status 04/29/2020 FINAL  Final  Culture, fungus without smear     Status: None (Preliminary result)   Collection Time: 04/25/20  4:50 PM   Specimen: CSF; Cerebrospinal Fluid  Result Value Ref Range Status   Specimen Description CSF  Final   Special Requests NONE  Final   Culture   Final    NO FUNGUS ISOLATED AFTER 2 DAYS Performed at [facility], [address], [place], [place] [address]    Report Status PENDING  Incomplete  Anaerobic culture     Status: None (Preliminary result)   Collection Time: 04/25/20  8:00 PM   Specimen: CSF; Cerebrospinal Fluid  Result Value Ref Range Status   Specimen Description CSF TUBE 3  Final   Special Requests NONE  Final   Gram Stain   Final    ABUNDANT WBC PRESENT,BOTH PMN AND MONONUCLEAR NO ORGANISMS SEEN    Culture   Final    NO GROWTH 5 DAYS Performed at [facility], [address] [place], [place] [address]    Report Status PENDING  Incomplete  Culture, blood (routine x 2)     Status: None (Preliminary result)   Collection Time: 04/26/20 12:59 AM   Specimen: BLOOD  Result Value Ref Range Status   Specimen Description BLOOD RIGHT ARM  Final   Special Requests   Final    BOTTLES DRAWN AEROBIC AND ANAEROBIC Blood Culture adequate volume   Culture   Final     NO GROWTH 3 DAYS Performed at [facility], [address], [place], [place] [address]    Report Status PENDING  Incomplete  Culture, blood (routine x 2)     Status: None (Preliminary result)   Collection Time: 04/26/20  1:04 AM   Specimen: BLOOD  Result Value Ref Range Status   Specimen Description BLOOD RIGHT HAND  Final   Special Requests   Final    BOTTLES DRAWN AEROBIC AND ANAEROBIC Blood Culture adequate volume   Culture   Final    NO GROWTH 3 DAYS Performed at [facility], [address] [place], [place] [address]    Report Status PENDING  Incomplete    Impression/Plan:  1. Meningitis - no new results.  CSF Tb PCR ordered.  Differential remains broad.    2.  Adenopathy - likely related to #1.  Differential is infectious, lymphoma.  I favor early biopsy such as EBUS if possible to get a diagnosis with this broad differential.    3.  IP - no indication for isolation for Tb with no pulmonary symptoms or concerning findings on CXR.

## 2020-04-29 NOTE — Progress Notes (Signed)
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  Outpatient he had received a course of steroids and completed course of Augmentin.  Due to persistent fever had CT chest which showed right lower lobe nodule, left lower lobe nodule and bilateral hilar lymphadenopathy.  Some focal sclerosing lesion in the L1 vertebral body.  Later also developed truncal and bilateral upper and lower extremity rash.  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  ID consulted and started on steroids and RIPE therapy.   Assessment & Plan:   Active Problems:   Fever and chills   Bilateral headaches   Meningitis  Recurrent fevers with headaches.  Suspicion TB meningitis Bilateral hilar lymphadenopathy/mediastinal lymphadenopathy Bilateral pulmonary nodules -Patient has been started on RIPE therapy and steroids -CSF studies-for AFB-pending -Discussed with infectious disease, Dr. Dwyane Dee.  Respiration isolation discontinued. -We will consult pulmonary to weigh in.  Patient will likely need biopsy for lymphadenopathy.  Could this be lymphoma?  Could be causing fevers.  Leptomeningeal meningitis?  Occult malignancy? -Patient denies any respiratory symptoms including cough and phlegm production -HIV-negative -QuantiFERON test-pending.  CSF AFB smears -Continue supportive care. -CT chest without contrast 9/27-multifocal mediastinal/hilar adenopathy, calcification of right hilar nodes, granulomatous disease?  3-4 mm stable nodule.  Previously scan reviewed by Dr. Lake Bells.  Held off on EBUS/bronchoscopy until now.  Pulmonary officially consulted now. -Splenic artery aneurysm calcification   DVT prophylaxis: heparin injection 5,000 Units Start: 04/26/20 1400 Code  Status: Full code Family Communication:    Status is: Inpatient  Remains inpatient appropriate because:Inpatient level of care appropriate due to severity of illness  Dispo: The patient is from: Home              Anticipated d/c is to: Home              Anticipated d/c date is: > 3 days              Patient currently is not medically stable to d/c.  Ongoing treatment for TB meningitis.  Maintain hospital stay until cleared by infectious disease.   Body mass index is 23.05 kg/m.  Subjective: Translator ID [id number]  Met with the patient, daughter and his wife at bedside.  Translator line used.  Family and patient denies any complaints.  They have been updated regarding ongoing issues.  Family has questions about [place] Nile virus which were answered but any details of was deferred to infectious disease.  Review of Systems Otherwise negative except as per HPI, including: General: Denies fever, chills, night sweats or unintended weight loss. Resp: Denies cough, wheezing, shortness of breath. Cardiac: Denies chest pain, palpitations, orthopnea, paroxysmal nocturnal dyspnea. GI: Denies abdominal pain, nausea, vomiting, diarrhea or constipation GU: Denies dysuria, frequency, hesitancy or incontinence MS: Denies muscle aches, joint pain or swelling Neuro: Denies headache, neurologic deficits (focal weakness, numbness, tingling), abnormal gait Psych: Denies anxiety, depression, SI/HI/AVH Skin: Denies new rashes or lesions ID: Denies sick contacts, exotic exposures, travel  Examination:  Constitutional: Not in acute distress Respiratory: Clear to auscultation bilaterally Cardiovascular: Normal sinus rhythm, no rubs Abdomen: Nontender nondistended good bowel sounds Musculoskeletal: No edema noted Skin: No rashes seen Neurologic: CN 2-12 grossly intact.  And nonfocal Psychiatric: Normal judgment and insight. Alert  and oriented x 3. Normal mood.   Objective: Vitals:   04/28/20 1726  04/28/20 2118 04/29/20 0637 04/29/20 0904  BP: 118/81 122/83 127/79 125/80  Pulse: 98 (!) 102 94 98  Resp: _0 Temp: 99.5 F (37.5 C) 98.6 F (37 C) 98.7 F (37.1 C) 98.5 F (36.9 C)  TempSrc: Oral Oral Oral Oral  SpO2: 97% 96% 99% 97%  Weight:      Height:        Intake/Output Summary (Last 24 hours) at 04/29/2020 1313 Last data filed at 04/29/2020 0900 Gross per 24 hour  Intake 520 ml  Output 650 ml  Net -130 ml   Filed Weights   04/25/20 1324 04/27/20 0635  Weight: 86.2 kg 79.2 kg     Data Reviewed:   CBC: Recent Labs  Lab 04/25/20 1118 04/26/20 1620  WBC 4.5 7.5  NEUTROABS 2.9  --   HGB 15.1 13.8  HCT 44.8 41.2  MCV 93.1 91.4  PLT 200 758   Basic Metabolic Panel: Recent Labs  Lab 04/25/20 1118 04/26/20 1620 04/27/20 0438 04/28/20 0736 04/29/20 1122  NA 137 137 135 134* 132*  K 4.7 4.4 4.7 4.6 4.5  CL 103 99 103 101 96*  CO2 _1 GLUCOSE 104* 131* 102* 98 110*  BUN _2 CREATININE 0.78 0.77 0.68 0.90 0.91  CALCIUM 9.4 9.1 9.1 9.1 9.5  MG  --   --  1.9 1.8 2.0   GFR: Estimated Creatinine Clearance: 113.6 mL/min (by C-G formula based on SCr of 0.91 mg/dL). Liver Function Tests: Recent Labs  Lab 04/25/20 1118 04/26/20 1620 04/27/20 0438 04/28/20 0736 04/29/20 1122  AST 18 15 14* 12* 13*  ALT _3 ALKPHOS 35* 38 35* 39 36*  BILITOT 0.9 1.7* 1.0 1.0 1.0  PROT 7.0 6.4* 6.6 6.6 6.8  ALBUMIN 3.8 3.4* 3.5 3.5 3.6   No results for input(s): LIPASE, AMYLASE in the last 168 hours. No results for input(s): AMMONIA in the last 168 hours. Coagulation Profile: No results for input(s): INR, PROTIME in the last 168 hours. Cardiac Enzymes: No results for input(s): CKTOTAL, CKMB, CKMBINDEX, TROPONINI in the last 168 hours. BNP (last 3 results) No results for input(s): PROBNP in the last 8760 hours. HbA1C: No results for input(s): HGBA1C in the last 72 hours. CBG: No results for input(s): GLUCAP in the last  168 hours. Lipid Profile: No results for input(s): CHOL, HDL, LDLCALC, TRIG, CHOLHDL, LDLDIRECT in the last 72 hours. Thyroid Function Tests: No results for input(s): TSH, T4TOTAL, FREET4, T3FREE, THYROIDAB in the last 72 hours. Anemia Panel: No results for input(s): VITAMINB12, FOLATE, FERRITIN, TIBC, IRON, RETICCTPCT in the last 72 hours. Sepsis Labs: Recent Labs  Lab 04/25/20 1118 04/25/20 1335  LATICACIDVEN 1.4 1.5    Recent Results (from the past 240 hour(s))  Blood culture (routine x 2)     Status: None   Collection Time: 04/22/20  2:10 AM   Specimen: BLOOD RIGHT HAND  Result Value Ref Range Status   Specimen Description BLOOD RIGHT HAND  Final   Special Requests   Final    BOTTLES DRAWN AEROBIC AND ANAEROBIC Blood Culture adequate volume   Culture   Final    NO GROWTH 5 DAYS Performed at [facility], [address], [place], [place] [address]    Report Status 04/27/2020 FINAL  Final  Blood culture (routine x 2)  Status: None   Collection Time: 04/22/20  2:20 AM   Specimen: BLOOD LEFT ARM  Result Value Ref Range Status   Specimen Description BLOOD LEFT ARM  Final   Special Requests   Final    BOTTLES DRAWN AEROBIC AND ANAEROBIC Blood Culture adequate volume   Culture   Final    NO GROWTH 5 DAYS Performed at [facility], [address], [place], [place] [address]    Report Status 04/27/2020 FINAL  Final  CSF culture     Status: None   Collection Time: 04/25/20  4:50 PM   Specimen: CSF; Cerebrospinal Fluid  Result Value Ref Range Status   Specimen Description CSF  Final   Special Requests NONE  Final   Gram Stain   Final    ABUNDANT WBC PRESENT,BOTH PMN AND MONONUCLEAR NO ORGANISMS SEEN    Culture   Final    NO GROWTH Performed at [facility], [place] [address] [place], [place] [address]    Report Status 04/29/2020 FINAL  Final  Culture, fungus without smear     Status: None (Preliminary result)   Collection Time: 04/25/20  4:50 PM    Specimen: CSF; Cerebrospinal Fluid  Result Value Ref Range Status   Specimen Description CSF  Final   Special Requests NONE  Final   Culture   Final    NO FUNGUS ISOLATED AFTER 2 DAYS Performed at [facility], [place] [address] [place], [place] [address]    Report Status PENDING  Incomplete  Anaerobic culture     Status: None (Preliminary result)   Collection Time: 04/25/20  8:00 PM   Specimen: CSF; Cerebrospinal Fluid  Result Value Ref Range Status   Specimen Description CSF TUBE 3  Final   Special Requests NONE  Final   Gram Stain   Final    ABUNDANT WBC PRESENT,BOTH PMN AND MONONUCLEAR NO ORGANISMS SEEN    Culture   Final    NO GROWTH 5 DAYS Performed at [facility], [address], [place], [place] [address]    Report Status PENDING  Incomplete  Culture, blood (routine x 2)     Status: None (Preliminary result)   Collection Time: 04/26/20 12:59 AM   Specimen: BLOOD  Result Value Ref Range Status   Specimen Description BLOOD RIGHT ARM  Final   Special Requests   Final    BOTTLES DRAWN AEROBIC AND ANAEROBIC Blood Culture adequate volume   Culture   Final    NO GROWTH 3 DAYS Performed at [facility], [address], [place], [place] [address]    Report Status PENDING  Incomplete  Culture, blood (routine x 2)     Status: None (Preliminary result)   Collection Time: 04/26/20  1:04 AM   Specimen: BLOOD  Result Value Ref Range Status   Specimen Description BLOOD RIGHT HAND  Final   Special Requests   Final    BOTTLES DRAWN AEROBIC AND ANAEROBIC Blood Culture adequate volume   Culture   Final    NO GROWTH 3 DAYS Performed at [facility], [place] [address], [place],  [address]    Report Status PENDING  Incomplete         Radiology Studies: No results found.      Scheduled Meds: . dexamethasone  4 mg Oral Q8H  . ethambutol  1,600 mg Oral Daily  . heparin  5,000 Units Subcutaneous Q8H  . isoniazid  300 mg Oral Daily  .  pyrazinamide  2,000 mg Oral Daily  . vitamin B-6  50 mg Oral Daily  . rifampin  600 mg Oral Daily   Continuous Infusions:    LOS: 3 days   Time spent= 35 mins    Aaron Chapman Arsenio Loader, MD [facility]  If 7PM-7AM, please contact night-coverage  04/29/2020, 1:13 PM

## 2020-04-29 NOTE — Progress Notes (Signed)
Mr. Aaron Chapman continues to have no respiratory symptoms or concerns including no cough, no sob, no sputum production.  No indication for continued respiratory isolation and I will d/c it. Gardiner Barefoot, MD

## 2020-04-29 NOTE — Consult Note (Signed)
NAME:  Aaron Chapman, MRN:  [id number], DOB:  1976-06-16, LOS: 3 ADMISSION DATE:  04/25/2020, CONSULTATION DATE:  9/29 REFERRING MD:  Dr. Reesa Chew, CHIEF COMPLAINT:  Lymphadenomapathy   Brief History   44 year old male admitted with recurrent fevers and nodules on CT. PCCM consulted for consideration of bronchoscopy.   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 He also recently been diagnosed with COVID-19 8/14. He has a > 1 month history of fevers pre-dating his COVID-19 diagnosis. As COVID came and went so did his fevers, but they returned with headaches and SOB some time after. Associated rash on the trunk and extremities. He presented to the ED with these complaints on 9/25 where lumbar puncture was done and was concerning for non-bacterial meningitis. He was admitted to the hospitalist service for further workup, which included MRI, Neurology consultation, and infectious disease consultation. Infectious disease specialists have not been able to define definitive cause, but the differential includes infection and lymphoma. They have requested pulmonary consultation for EBUS biopsy.   Patient says that since coming to the hospital he has been feeling better.  He still has troubles with weakness and some dizziness but in general he has been getting better.  He has been treated with TB therapy and steroids here.  Some results of come back from CSF and serology testing, see summary below  He works [employer] outside.  He denies any recent trouble with shortness of breath, cough, weight loss joint aches.  Past Medical History   has a past medical history of COVID-19. Dengue as a child Zika in 2016 Chikungunya in 2017  [facility] Events     Consults:/  ID Neuro PCCM  Procedures:  9/25 Lumbar puncture in ED  Significant Diagnostic Tests:     Micro Data:  MRI brain 9/25 > T2/FLAIR hyperintense foci involving the right middle cerebellar peduncle are suspicious for demyelination MRI with contrast 9/26 > Findings concerning for acute basilar predominant meningitis with associated cerebritis at the right middle cerebellar peduncle as above. Given the basilar predilection, atypical etiologies including TB and/or fungal meningitis could be considered. No discrete abscess, hydrocephalus, or other complication at this time.   September 26 CT chest images independently reviewed showing normal pulmonary parenchyma and diffuse mediastinal and hilar adenopathy  Antimicrobials:  RIPE 9/26 >  Interim history/subjective:    Objective   Blood pressure 125/80, pulse 98, temperature 98.5 F (36.9 C), temperature source Oral, resp. rate 20, height _0  (1.854 m), weight 79.2 kg, SpO2 97 %.        Intake/Output Summary (Last 24 hours) at 04/29/2020 1332 Last data filed at 04/29/2020 0900 Gross per 24 hour  Intake 520 ml  Output 650 ml  Net -130 ml   Filed Weights   04/25/20 1324 04/27/20 0635  Weight: 86.2 kg 79.2 kg    Examination:  General:  Resting comfortably in bed HENT: NCAT OP clear PULM: CTA B, normal effort CV: RRR, no mgr GI: BS+, soft, nontender MSK: normal bulk and tone Neuro: awake, alert, L grip strength 4/5, R grip strength 5/5   Resolved Hospital Problem list     Assessment & Plan:  Acute meningitis with pleocytosis, elevated protein, low glucose: Differential diagnosis broad, currently being treated for possible TB meningitis.  Enterovirus panel pending, HSV panel incomplete, VDRL unfortunately was not sent. Quantiferon  gold negative > Management per infectious diseases  Mediastinal adenopathy: Given recent Covid infection and current likely viral or infectious meningitis there is a reasonable chance that the mediastinal adenopathy is related.  Calcification of various nodes and his travel history does  raise the possibility of fungal meningitis.  Differential diagnosis here is broad including various inflammatory, infectious and malignant causes.  I suppose this could be consistent with sarcoidosis or an acute inflammatory problem like SLE or RA associated menigitis but that seems much less likely.   > Plan endobronchial ultrasound-guided fine-needle aspiration on September 30; plan for routine cytology, send for special stains for possible infectious etiologies > We will plan to send a BAL for infectious etiologies as well as cell count  Best practice:    Labs   CBC: Recent Labs  Lab 04/25/20 1118 04/26/20 1620  WBC 4.5 7.5  NEUTROABS 2.9  --   HGB 15.1 13.8  HCT 44.8 41.2  MCV 93.1 91.4  PLT 200 536    Basic Metabolic Panel: Recent Labs  Lab 04/25/20 1118 04/26/20 1620 04/27/20 0438 04/28/20 0736 04/29/20 1122  NA 137 137 135 134* 132*  K 4.7 4.4 4.7 4.6 4.5  CL 103 99 103 101 96*  CO2 _0 GLUCOSE 104* 131* 102* 98 110*  BUN _1 CREATININE 0.78 0.77 0.68 0.90 0.91  CALCIUM 9.4 9.1 9.1 9.1 9.5  MG  --   --  1.9 1.8 2.0   GFR: Estimated Creatinine Clearance: 113.6 mL/min (by C-G formula based on SCr of 0.91 mg/dL). Recent Labs  Lab 04/25/20 1118 04/25/20 1335 04/26/20 1620  WBC 4.5  --  7.5  LATICACIDVEN 1.4 1.5  --     Liver Function Tests: Recent Labs  Lab 04/25/20 1118 04/26/20 1620 04/27/20 0438 04/28/20 0736 04/29/20 1122  AST 18 15 14* 12* 13*  ALT _2 ALKPHOS 35* 38 35* 39 36*  BILITOT 0.9 1.7* 1.0 1.0 1.0  PROT 7.0 6.4* 6.6 6.6 6.8  ALBUMIN 3.8 3.4* 3.5 3.5 3.6   No results for input(s): LIPASE, AMYLASE in the last 168 hours. No results for input(s): AMMONIA in the last 168 hours.  ABG No results found for: PHART, PCO2ART, PO2ART, HCO3, TCO2, ACIDBASEDEF, O2SAT   Coagulation Profile: No results for input(s): INR, PROTIME in the last 168 hours.  Cardiac Enzymes: No results for input(s): CKTOTAL,  CKMB, CKMBINDEX, TROPONINI in the last 168 hours.  HbA1C: No results found for: HGBA1C  CBG: No results for input(s): GLUCAP in the last 168 hours.  Review of Systems:   Gen: Denies fever, chills, weight change, fatigue, night sweats HEENT: Denies blurred vision, double vision, hearing loss, tinnitus, sinus congestion, rhinorrhea, sore throat, neck stiffness, dysphagia PULM: Denies shortness of breath, cough, sputum production, hemoptysis, wheezing CV: Denies chest pain, edema, orthopnea, paroxysmal nocturnal dyspnea, palpitations GI: Denies abdominal pain, nausea, vomiting, diarrhea, hematochezia, melena, constipation, change in bowel habits GU: Denies dysuria, hematuria, polyuria, oliguria, urethral discharge Endocrine: Denies hot or cold intolerance, polyuria, polyphagia or appetite change Derm: Denies rash, dry skin, scaling or peeling skin change Heme: Denies easy bruising, bleeding, bleeding gums Neuro: per HPI   Past Medical History  He,  has a past medical history of COVID-19.   Surgical History   History reviewed. No pertinent surgical history.   Social History   reports that he has never smoked. He has never used smokeless tobacco. He reports  previous alcohol use. He reports previous drug use.   Family History   His family history is not on file.   Allergies Allergies  Allergen Reactions  . Aspirin Swelling    Facial swelling     Home Medications  Prior to Admission medications   Medication Sig Start Date End Date Taking? Authorizing Provider  Ascorbic Acid (VITAMIN C) 1000 MG tablet Take 1,000 mg by mouth daily.   Yes [provider]  ibuprofen (ADVIL) 800 MG tablet Take 800 mg by mouth every 8 (eight) hours as needed for fever.   Yes [provider]  promethazine-dextromethorphan (PROMETHAZINE-DM) 6.25-15 MG/5ML syrup Take 5 mLs by mouth 4 (four) times daily as needed for cough. Patient taking differently: Take 5 mLs by mouth 2 (two) times  daily as needed for cough.  04/22/20  Yes McDonald, Mia A, PA-C  VITAMIN A PO Take 1 capsule by mouth daily.   Yes [provider]  amoxicillin-clavulanate (AUGMENTIN) 875-125 MG tablet Take 1 tablet by mouth 2 (two) times daily.    [provider]  dexamethasone (DECADRON) 4 MG tablet Take 4 mg by mouth daily.    [provider]     Critical care time: n/a    Roselie Awkward, MD [facility] Pager: [phone] Cell: [phone] If no response, call [phone]

## 2020-04-30 ENCOUNTER — Other Ambulatory Visit: Payer: Self-pay

## 2020-04-30 ENCOUNTER — Encounter (HOSPITAL_COMMUNITY): Payer: Self-pay | Admitting: Internal Medicine

## 2020-04-30 ENCOUNTER — Inpatient Hospital Stay (HOSPITAL_COMMUNITY): Payer: Self-pay | Admitting: Certified Registered Nurse Anesthetist

## 2020-04-30 ENCOUNTER — Encounter (HOSPITAL_COMMUNITY)
Admission: EM | Disposition: A | Discharge status: Home / Self Care | Payer: Self-pay | Source: Home / Self Care | Attending: Internal Medicine

## 2020-04-30 HISTORY — PX: BRONCHIAL WASHINGS: SHX5105

## 2020-04-30 HISTORY — PX: BRONCHIAL NEEDLE ASPIRATION BIOPSY: SHX5106

## 2020-04-30 HISTORY — PX: VIDEO BRONCHOSCOPY: SHX5072

## 2020-04-30 HISTORY — PX: ENDOBRONCHIAL ULTRASOUND: SHX5096

## 2020-04-30 LAB — IGG CSF INDEX
Albumin CSF-mCnc: 205 mg/dL — ABNORMAL HIGH (ref 10–45)
Albumin: 3.9 g/dL — ABNORMAL LOW (ref 4.0–5.0)
CSF IgG Index: 1.1 — ABNORMAL HIGH (ref 0.0–0.7)
IgG (Immunoglobin G), Serum: 1065 mg/dL (ref 603–1613)
IgG, CSF: 59.2 mg/dL — ABNORMAL HIGH (ref 0.0–10.3)
IgG/Alb Ratio, CSF: 0.29 — ABNORMAL HIGH (ref 0.00–0.25)

## 2020-04-30 LAB — MAGNESIUM: Magnesium: 2 mg/dL (ref 1.7–2.4)

## 2020-04-30 LAB — PROTIME-INR
INR: 1.1 (ref 0.8–1.2)
Prothrombin Time: 13.4 seconds (ref 11.4–15.2)

## 2020-04-30 LAB — ACID FAST SMEAR (AFB, MYCOBACTERIA): Acid Fast Smear: NEGATIVE

## 2020-04-30 LAB — APTT: aPTT: 32 seconds (ref 24–36)

## 2020-04-30 LAB — MISC LABCORP TEST (SEND OUT): Labcorp test code: 139800

## 2020-04-30 SURGERY — ENDOBRONCHIAL ULTRASOUND (EBUS)
Anesthesia: General

## 2020-04-30 SURGERY — VIDEO BRONCHOSCOPY WITHOUT FLUORO
Anesthesia: General

## 2020-04-30 MED ORDER — SUGAMMADEX SODIUM 200 MG/2ML IV SOLN
INTRAVENOUS | Status: DC | PRN
  Administered 2020-04-30: 200 mg via INTRAVENOUS

## 2020-04-30 MED ORDER — LIDOCAINE 2% (20 MG/ML) 5 ML SYRINGE
INTRAMUSCULAR | Status: DC | PRN
  Administered 2020-04-30: 80 mg via INTRAVENOUS

## 2020-04-30 MED ORDER — ROCURONIUM BROMIDE 10 MG/ML (PF) SYRINGE
PREFILLED_SYRINGE | INTRAVENOUS | Status: DC | PRN
  Administered 2020-04-30: 50 mg via INTRAVENOUS
  Administered 2020-04-30: 20 mg via INTRAVENOUS

## 2020-04-30 MED ORDER — LACTATED RINGERS IV SOLN: INTRAVENOUS | Status: DC

## 2020-04-30 MED ORDER — FENTANYL CITRATE (PF) 100 MCG/2ML IJ SOLN
INTRAMUSCULAR | Status: DC | PRN
Start: 2020-04-30 — End: 2020-04-30
  Administered 2020-04-30 (×2): 50 ug via INTRAVENOUS

## 2020-04-30 MED ORDER — PROPOFOL 10 MG/ML IV BOLUS
INTRAVENOUS | Status: DC | PRN
  Administered 2020-04-30: 150 mg via INTRAVENOUS

## 2020-04-30 MED ORDER — MIDAZOLAM HCL 5 MG/5ML IJ SOLN
INTRAMUSCULAR | Status: DC | PRN
  Administered 2020-04-30: 2 mg via INTRAVENOUS

## 2020-04-30 MED ORDER — SODIUM CHLORIDE 0.9 % IV BOLUS
1000.0000 mL | Freq: Once | INTRAVENOUS | Status: AC
  Administered 2020-04-30: 1000 mL via INTRAVENOUS

## 2020-04-30 MED ORDER — CHLORHEXIDINE GLUCONATE 0.12 % MT SOLN: 15.0000 mL | OROMUCOSAL | Status: AC

## 2020-04-30 MED ORDER — ACETAMINOPHEN 325 MG PO TABS
650.0000 mg | ORAL_TABLET | Freq: Four times a day (QID) | ORAL | Status: DC | PRN
  Administered 2020-04-30 – 2020-05-05 (×7): 650 mg via ORAL
  Filled 2020-04-30 (×7): qty 2

## 2020-04-30 MED ORDER — CHLORHEXIDINE GLUCONATE 0.12 % MT SOLN
OROMUCOSAL | Status: AC
  Administered 2020-04-30: 15 mL via OROMUCOSAL
  Filled 2020-04-30: qty 15

## 2020-04-30 MED ORDER — ONDANSETRON HCL 4 MG/2ML IJ SOLN
INTRAMUSCULAR | Status: DC | PRN
  Administered 2020-04-30: 4 mg via INTRAVENOUS

## 2020-04-30 NOTE — Anesthesia Preprocedure Evaluation (Signed)
Anesthesia Evaluation  Patient identified by MRN, date of birth, ID band Patient awake    Reviewed: Allergy & Precautions, NPO status , Patient's Chart, lab work & pertinent test results  Airway Mallampati: II  TM Distance: >3 FB Neck ROM: Full    Dental  (+) Chipped, Caps, Dental Advisory Given,    Pulmonary shortness of breath and with exertion, pneumonia,  Covid 19 - 03/14/20 Mediastinal adenopathy Pulmonary nodule   Pulmonary exam normal breath sounds clear to auscultation       Cardiovascular negative cardio ROS Normal cardiovascular exam Rate:Normal     Neuro/Psych  Headaches, negative psych ROS   GI/Hepatic negative GI ROS, Neg liver ROS,   Endo/Other  negative endocrine ROS  Renal/GU Hx/o renal calculi  negative genitourinary   Musculoskeletal negative musculoskeletal ROS (+)   Abdominal   Peds  Hematology negative hematology ROS (+)   Anesthesia Other Findings   Reproductive/Obstetrics                             Anesthesia Physical Anesthesia Plan  ASA: II  Anesthesia Plan: General   Post-op Pain Management:    Induction: Intravenous  PONV Risk Score and Plan: 3 and Treatment may vary due to age or medical condition, Midazolam and Ondansetron  Airway Management Planned: Oral ETT  Additional Equipment: None  Intra-op Plan:   Post-operative Plan: Extubation in OR  Informed Consent: I have reviewed the patients History and Physical, chart, labs and discussed the procedure including the risks, benefits and alternatives for the proposed anesthesia with the patient or authorized representative who has indicated his/her understanding and acceptance.     Dental advisory given  Plan Discussed with: CRNA and Anesthesiologist  Anesthesia Plan Comments:         Anesthesia Quick Evaluation

## 2020-04-30 NOTE — Anesthesia Procedure Notes (Signed)
Procedure Name: Intubation Date/Time: 04/30/2020 10:06 AM Performed by: Amadeo Garnet, CRNA Pre-anesthesia Checklist: Patient identified, Emergency Drugs available, Suction available and Patient being monitored Patient Re-evaluated:Patient Re-evaluated prior to induction Oxygen Delivery Method: Circle system utilized Preoxygenation: Pre-oxygenation with 100% oxygen Induction Type: IV induction Ventilation: Mask ventilation without difficulty Laryngoscope Size: Mac and 4 Grade View: Grade I Tube type: Oral Tube size: 8.5 mm Number of attempts: 1 Airway Equipment and Method: Stylet Placement Confirmation: ETT inserted through vocal cords under direct vision,  positive ETCO2 and breath sounds checked- equal and bilateral Secured at: 22 cm Tube secured with: Tape Dental Injury: Teeth and Oropharynx as per pre-operative assessment

## 2020-04-30 NOTE — Transfer of Care (Signed)
Immediate Anesthesia Transfer of Care Note  Patient: Aaron Chapman  Procedure(s) Performed: VIDEO BRONCHOSCOPY WITHOUT FLUORO (N/A ) ENDOBRONCHIAL ULTRASOUND (N/A ) BRONCHIAL WASHINGS BRONCHIAL NEEDLE ASPIRATION BIOPSIES  Patient Location: Endoscopy Unit  Anesthesia Type:General  Level of Consciousness: awake, alert  and oriented  Airway & Oxygen Therapy: Patient Spontanous Breathing and Patient connected to nasal cannula oxygen  Post-op Assessment: Report given to RN, Post -op Vital signs reviewed and stable and Patient moving all extremities  Post vital signs: Reviewed and stable  Last Vitals:  Vitals Value Taken Time  BP 132/83 04/30/20 1059  Temp 37.6 C 04/30/20 1059  Pulse 109 04/30/20 1101  Resp 17 04/30/20 1101  SpO2 98 % 04/30/20 1101  Vitals shown include unvalidated device data.  Last Pain:  Vitals:   04/30/20 1059  TempSrc: Oral  PainSc: 0-No pain      Patients Stated Pain Goal: 2 (04/30/20 0910)  Complications: No complications documented.

## 2020-04-30 NOTE — Progress Notes (Signed)
Patient has returned from endobronchial ultrasound and his status has changed from a green to yellow MEWs, MD has been notified there are no new orders at this time and RN will continue to monitor this patient and  follow procedures according to yellow MEWS protocol

## 2020-04-30 NOTE — Progress Notes (Signed)
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  Outpatient he had received a course of steroids and completed course of Augmentin.  Due to persistent fever had CT chest which showed right lower lobe nodule, left lower lobe nodule and bilateral hilar lymphadenopathy.  Some focal sclerosing lesion in the L1 vertebral body.  Later also developed truncal and bilateral upper and lower extremity rash.  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  ID consulted and started on steroids and RIPE therapy.  Pulmonary team was consulted and underwent bronchoscopy/EBUS on 9/30.   Assessment & Plan:   Active Problems:   Fever and chills   Bilateral headaches   Meningitis  Recurrent fevers with headaches.  Suspicion TB meningitis Bilateral hilar lymphadenopathy/mediastinal lymphadenopathy Bilateral pulmonary nodules -Patient has been started on RIPE therapy and steroids -CSF studies-for AFB-negative -Discussed with infectious disease, Dr. Dwyane Dee.  Respiration isolation discontinued. -We will consult pulmonary to weigh in.  Patient will likely need biopsy for lymphadenopathy.  Could this be lymphoma?  Could be causing fevers.  Leptomeningeal meningitis?  Occult malignancy? -Patient denies any respiratory symptoms including cough and phlegm production -HIV-negative -QuantiFERON test-pending.  CSF AFB smears -Continue supportive care. -CT chest without contrast 9/27-multifocal mediastinal/hilar adenopathy, calcification of right hilar nodes, granulomatous disease?  3-4 mm stable nodule.  -Pulmonary team consulted.  Status post bronc/EBUS.  Results pending. -Splenic artery aneurysm calcification   DVT prophylaxis: heparin injection 5,000 Units Start:  04/26/20 1400 Code Status: Full code Family Communication:    Status is: Inpatient  Remains inpatient appropriate because:Inpatient level of care appropriate due to severity of illness  Dispo: The patient is from: Home              Anticipated d/c is to: Home              Anticipated d/c date is: > 3 days              Patient currently is not medically stable to d/c.  Ongoing treatment for TB meningitis.  Maintain hospital stay until cleared by infectious disease.   Body mass index is 21.81 kg/m.  Subjective: Met with patient and his wife at bedside after the procedure.  They deny any complaints.  Tolerated his bronchoscopy/EBUS very well.  Review of Systems Otherwise negative except as per HPI, including: General: Denies fever, chills, night sweats or unintended weight loss. Resp: Denies cough, wheezing, shortness of breath. Cardiac: Denies chest pain, palpitations, orthopnea, paroxysmal nocturnal dyspnea. GI: Denies abdominal pain, nausea, vomiting, diarrhea or constipation GU: Denies dysuria, frequency, hesitancy or incontinence MS: Denies muscle aches, joint pain or swelling Neuro: Denies headache, neurologic deficits (focal weakness, numbness, tingling), abnormal gait Psych: Denies anxiety, depression, SI/HI/AVH Skin: Denies new rashes or lesions ID: Denies sick contacts, exotic exposures, travel Examination:  Constitutional: Not in acute distress Respiratory: Clear to auscultation bilaterally Cardiovascular: Normal sinus rhythm, no rubs Abdomen: Nontender nondistended good bowel sounds Musculoskeletal: No edema noted Skin: No rashes seen Neurologic: CN 2-12 grossly intact.  And nonfocal Psychiatric: Normal judgment and insight. Alert and oriented x 3. Normal mood. Objective: Vitals:   04/30/20 1120 04/30/20 1129 04/30/20 1157 04/30/20 1242  BP: (!) 143/87 123/84 (!) 121/94 109/61  Pulse: (!) 107 Marland Kitchen)  109 (!) 114 (!) 111  Resp: 19 20 18 18   Temp:   99.8 F (37.7  C) 99.2 F (37.3 C)  TempSrc:   Oral Oral  SpO2: 97% 96% 96% 97%  Weight:      Height:        Intake/Output Summary (Last 24 hours) at 04/30/2020 1338 Last data filed at 04/30/2020 1047 Gross per 24 hour  Intake 1360 ml  Output 1000 ml  Net 360 ml   Filed Weights   04/25/20 1324 04/27/20 0635 04/30/20 0910  Weight: 86.2 kg 79.2 kg 75 kg     Data Reviewed:   CBC: Recent Labs  Lab 04/25/20 1118 04/26/20 1620  WBC 4.5 7.5  NEUTROABS 2.9  --   HGB 15.1 13.8  HCT 44.8 41.2  MCV 93.1 91.4  PLT 200 161   Basic Metabolic Panel: Recent Labs  Lab 04/25/20 1118 04/26/20 1620 04/27/20 0438 04/28/20 0736 04/29/20 1122 04/30/20 0852  NA 137 137 135 134* 132*  --   K 4.7 4.4 4.7 4.6 4.5  --   CL 103 99 103 101 96*  --   CO2 26 23 24 26 27   --   GLUCOSE 104* 131* 102* 98 110*  --   BUN 6 10 12 11 9   --   CREATININE 0.78 0.77 0.68 0.90 0.91  --   CALCIUM 9.4 9.1 9.1 9.1 9.5  --   MG  --   --  1.9 1.8 2.0 2.0   GFR: Estimated Creatinine Clearance: 107.6 mL/min (by C-G formula based on SCr of 0.91 mg/dL). Liver Function Tests: Recent Labs  Lab 04/25/20 1118 04/26/20 1620 04/27/20 0438 04/28/20 0736 04/29/20 1122  AST 18 15 14* 12* 13*  ALT 28 25 24 23 25   ALKPHOS 35* 38 35* 39 36*  BILITOT 0.9 1.7* 1.0 1.0 1.0  PROT 7.0 6.4* 6.6 6.6 6.8  ALBUMIN 3.8 3.4* 3.5 3.5 3.6   No results for input(s): LIPASE, AMYLASE in the last 168 hours. No results for input(s): AMMONIA in the last 168 hours. Coagulation Profile: Recent Labs  Lab 04/30/20 0852  INR 1.1   Cardiac Enzymes: No results for input(s): CKTOTAL, CKMB, CKMBINDEX, TROPONINI in the last 168 hours. BNP (last 3 results) No results for input(s): PROBNP in the last 8760 hours. HbA1C: No results for input(s): HGBA1C in the last 72 hours. CBG: No results for input(s): GLUCAP in the last 168 hours. Lipid Profile: No results for input(s): CHOL, HDL, LDLCALC, TRIG, CHOLHDL, LDLDIRECT in the last 72  hours. Thyroid Function Tests: No results for input(s): TSH, T4TOTAL, FREET4, T3FREE, THYROIDAB in the last 72 hours. Anemia Panel: No results for input(s): VITAMINB12, FOLATE, FERRITIN, TIBC, IRON, RETICCTPCT in the last 72 hours. Sepsis Labs: Recent Labs  Lab 04/25/20 1118 04/25/20 1335  LATICACIDVEN 1.4 1.5    Recent Results (from the past 240 hour(s))  Acid Fast Smear (AFB)     Status: None   Collection Time: 04/21/20  4:50 PM   Specimen: CSF; Cerebrospinal Fluid  Result Value Ref Range Status   AFB Specimen Processing Concentration  Final   Acid Fast Smear Negative  Final    Comment: (NOTE) Performed At: [facility] [place], [place] [id number] Rush Farmer MD WJ:[id number]    Source (AFB) CSF  Final    Comment: Performed at [facility], [place] [address] [place], [place] [address]  Blood culture (routine x 2)     Status: None  Collection Time: 04/22/20  2:10 AM   Specimen: BLOOD RIGHT HAND  Result Value Ref Range Status   Specimen Description BLOOD RIGHT HAND  Final   Special Requests   Final    BOTTLES DRAWN AEROBIC AND ANAEROBIC Blood Culture adequate volume   Culture   Final    NO GROWTH 5 DAYS Performed at [facility], [address] [place], [place] [address]    Report Status 04/27/2020 FINAL  Final  Blood culture (routine x 2)     Status: None   Collection Time: 04/22/20  2:20 AM   Specimen: BLOOD LEFT ARM  Result Value Ref Range Status   Specimen Description BLOOD LEFT ARM  Final   Special Requests   Final    BOTTLES DRAWN AEROBIC AND ANAEROBIC Blood Culture adequate volume   Culture   Final    NO GROWTH 5 DAYS Performed at [facility], [place] [address] [place], [place] [address]    Report Status 04/27/2020 FINAL  Final  CSF culture     Status: None   Collection Time: 04/25/20  4:50 PM   Specimen: CSF; Cerebrospinal Fluid  Result Value Ref Range Status   Specimen Description CSF  Final   Special Requests  NONE  Final   Gram Stain   Final    ABUNDANT WBC PRESENT,BOTH PMN AND MONONUCLEAR NO ORGANISMS SEEN    Culture   Final    NO GROWTH Performed at [facility], [place] [address] [place], [place] [address]    Report Status 04/29/2020 FINAL  Final  Culture, fungus without smear     Status: None (Preliminary result)   Collection Time: 04/25/20  4:50 PM   Specimen: CSF; Cerebrospinal Fluid  Result Value Ref Range Status   Specimen Description CSF  Final   Special Requests NONE  Final   Culture   Final    NO FUNGUS ISOLATED AFTER 3 DAYS Performed at [facility], [place] [address] [place], [place] [address]    Report Status PENDING  Incomplete  Anaerobic culture     Status: None (Preliminary result)   Collection Time: 04/25/20  8:00 PM   Specimen: CSF; Cerebrospinal Fluid  Result Value Ref Range Status   Specimen Description CSF TUBE 3  Final   Special Requests NONE  Final   Gram Stain   Final    ABUNDANT WBC PRESENT,BOTH PMN AND MONONUCLEAR NO ORGANISMS SEEN    Culture   Final    NO GROWTH 5 DAYS Performed at [facility], [address], [place], [place] [address]    Report Status PENDING  Incomplete  Culture, blood (routine x 2)     Status: None (Preliminary result)   Collection Time: 04/26/20 12:59 AM   Specimen: BLOOD  Result Value Ref Range Status   Specimen Description BLOOD RIGHT ARM  Final   Special Requests   Final    BOTTLES DRAWN AEROBIC AND ANAEROBIC Blood Culture adequate volume   Culture   Final    NO GROWTH 4 DAYS Performed at [facility], [place] [address] [place], [place] [address]    Report Status PENDING  Incomplete  Culture, blood (routine x 2)     Status: None (Preliminary result)   Collection Time: 04/26/20  1:04 AM   Specimen: BLOOD  Result Value Ref Range Status   Specimen Description BLOOD RIGHT HAND  Final   Special Requests   Final  BOTTLES DRAWN AEROBIC AND ANAEROBIC Blood Culture adequate volume   Culture   Final     NO GROWTH 4 DAYS Performed at [facility], [place] [address], [place], [place] [address]    Report Status PENDING  Incomplete         Radiology Studies: No results found.      Scheduled Meds: . dexamethasone  4 mg Oral Q8H  . ethambutol  1,600 mg Oral Daily  . heparin  5,000 Units Subcutaneous Q8H  . isoniazid  300 mg Oral Daily  . pyrazinamide  2,000 mg Oral Daily  . vitamin B-6  50 mg Oral Daily  . rifampin  600 mg Oral Daily   Continuous Infusions: . lactated ringers Stopped (04/30/20 1128)     LOS: 4 days   Time spent= 35 mins    Tysheka Fanguy Arsenio Loader, MD [facility]  If 7PM-7AM, please contact night-coverage  04/30/2020, 1:38 PM

## 2020-04-30 NOTE — Progress Notes (Signed)
[facility] [employer] in [place] to obtain information.

## 2020-04-30 NOTE — H&P (Signed)
[facility]  CC: weakness HPI 44 y/o male with multiple recent infections including COVID in August admitted for weakness, headache. Found to have meningitis with CSF pattern worrisome for infection.  Has mediastinal adenopathy. So far infectious work up has been non-revealing.  Past Medical History:  Diagnosis Date  . COVID-19    positive 03/14/20 - tested at a clinic in [place] - unsure of name of clinic  . Dyspnea   . History of kidney stones   . Lung nodules   . Pneumonia      History reviewed. No pertinent family history.   Social History   Socioeconomic History  . Marital status: Married    Spouse name: Not on file  . Number of children: Not on file  . Years of education: Not on file  . Highest education level: Not on file  Occupational History  . Not on file  Tobacco Use  . Smoking status: Never Smoker  . Smokeless tobacco: Never Used  Vaping Use  . Vaping Use: Never assessed  Substance and Sexual Activity  . Alcohol use: Yes    Comment: occasional   . Drug use: Not Currently  . Sexual activity: Not on file  Other Topics Concern  . Not on file  Social History Narrative  . Not on file   Social Determinants of Health   Financial Resource Strain:   . Difficulty of Paying Living Expenses: Not on file  Food Insecurity:   . Worried About Charity fundraiser in the Last Year: Not on file  . Ran Out of Food in the Last Year: Not on file  Transportation Needs:   . Lack of Transportation (Medical): Not on file  . Lack of Transportation (Non-Medical): Not on file  Physical Activity:   . Days of Exercise per Week: Not on file  . Minutes of Exercise per Session: Not on file  Stress:   . Feeling of Stress : Not on file  Social Connections:   . Frequency of Communication with Friends and Family: Not on file  . Frequency of Social Gatherings with Friends and Family: Not on file  . Attends Religious Services: Not on file  . Active Member of Clubs or Organizations: Not  on file  . Attends Archivist Meetings: Not on file  . Marital Status: Not on file  Intimate Partner Violence:   . Fear of Current or Ex-Partner: Not on file  . Emotionally Abused: Not on file  . Physically Abused: Not on file  . Sexually Abused: Not on file     Allergies  Allergen Reactions  . Aspirin Swelling    Facial swelling     _0 @ Vitals:   04/29/20 1622 04/29/20 2031 04/30/20 0502 04/30/20 0910  BP: 122/87 105/77 118/72   Pulse: (!) 110 (!) 110 (!) 101   Resp: _1 Temp: 99.3 F (37.4 C) 99.2 F (37.3 C) 98.5 F (36.9 C)   TempSrc: Oral Oral    SpO2: 96% 95% 98%   Weight:    75 kg  Height:    _2  (1.854 m)   General:  Resting comfortably in bed HENT: NCAT OP clear PULM: symmetric chest rise, normal effort BB:CWUG, well perfused,  GI:  soft, nontender MSK: normal bulk and tone Neuro: awake, alert, no distress, MAEW  CBC    Component Value Date/Time   WBC 7.5 04/26/2020 1620   RBC 4.51 04/26/2020 1620   HGB 13.8 04/26/2020 1620  HCT 41.2 04/26/2020 1620   PLT 210 04/26/2020 1620   MCV 91.4 04/26/2020 1620   MCH 30.6 04/26/2020 1620   MCHC 33.5 04/26/2020 1620   RDW 12.4 04/26/2020 1620   LYMPHSABS 0.8 04/25/2020 1118   MONOABS 0.6 04/25/2020 1118   EOSABS 0.1 04/25/2020 1118   BASOSABS 0.0 04/25/2020 1118   BMET    Component Value Date/Time   NA 132 (L) 04/29/2020 1122   K 4.5 04/29/2020 1122   CL 96 (L) 04/29/2020 1122   CO2 27 04/29/2020 1122   GLUCOSE 110 (H) 04/29/2020 1122   BUN 9 04/29/2020 1122   CREATININE 0.91 04/29/2020 1122   CALCIUM 9.5 04/29/2020 1122   GFRNONAA >60 04/29/2020 1122   GFRAA >60 04/29/2020 1122   CT images reviewed: diffuse mediastinal and hilar adenopathy  Impression/Plan:  Mediastinal adenopathy with poorly understood meningitis picture> plan ebus bronchoscopy with FNA today, BAL  Roselie Awkward, MD [facility] Pager: [phone] Cell: [phone] If no response, call  [phone]

## 2020-04-30 NOTE — Progress Notes (Signed)
RN was alerted by telemetry that patient sustaining sinus tachycardia in the 130s, MD has been notified and RN will continue to monitor this patient and await new orders

## 2020-04-30 NOTE — Progress Notes (Signed)
[facility] for Infectious Disease   Reason for visit: Follow up on meningitis  Interval History: no new issues.  S/p EBUS today.  No new results.  Day 5 RIPE   Physical Exam: Constitutional:  Vitals:   04/30/20 1537 04/30/20 1606  BP: 106/67 132/85  Pulse:  (!) 121  Resp:  18  Temp:  (!) 101.3 F (38.5 C)  SpO2:  92%   patient appears in NAD Respiratory: Normal respiratory effort; CTA B Cardiovascular: RRR GI: soft, nt, nd  Review of Systems: Constitutional: negative for fevers, chills, sweats, malaise and anorexia    Lab Results  Component Value Date   WBC 7.5 04/26/2020   HGB 13.8 04/26/2020   HCT 41.2 04/26/2020   MCV 91.4 04/26/2020   PLT 210 04/26/2020    Lab Results  Component Value Date   CREATININE 0.91 04/29/2020   BUN 9 04/29/2020   NA 132 (L) 04/29/2020   K 4.5 04/29/2020   CL 96 (L) 04/29/2020   CO2 27 04/29/2020    Lab Results  Component Value Date   ALT 25 04/29/2020   AST 13 (L) 04/29/2020   ALKPHOS 36 (L) 04/29/2020     Microbiology: Recent Results (from the past 240 hour(s))  Acid Fast Smear (AFB)     Status: None   Collection Time: 04/21/20  4:50 PM   Specimen: CSF; Cerebrospinal Fluid  Result Value Ref Range Status   AFB Specimen Processing Concentration  Final   Acid Fast Smear Negative  Final    Comment: (NOTE) Performed At: [facility] [address] [place], [place] [id number] Rush Farmer MD [id number]    Source (AFB) CSF  Final    Comment: Performed at [facility], [place] [address], [place], [place] [address]  Blood culture (routine x 2)     Status: None   Collection Time: 04/22/20  2:10 AM   Specimen: BLOOD RIGHT HAND  Result Value Ref Range Status   Specimen Description BLOOD RIGHT HAND  Final   Special Requests   Final    BOTTLES DRAWN AEROBIC AND ANAEROBIC Blood Culture adequate volume   Culture   Final    NO GROWTH 5 DAYS Performed at [facility], [place] [address],  [place], [place] [address]    Report Status 04/27/2020 FINAL  Final  Blood culture (routine x 2)     Status: None   Collection Time: 04/22/20  2:20 AM   Specimen: BLOOD LEFT ARM  Result Value Ref Range Status   Specimen Description BLOOD LEFT ARM  Final   Special Requests   Final    BOTTLES DRAWN AEROBIC AND ANAEROBIC Blood Culture adequate volume   Culture   Final    NO GROWTH 5 DAYS Performed at [facility], [place] [address], [place], [place] [address]    Report Status 04/27/2020 FINAL  Final  CSF culture     Status: None   Collection Time: 04/25/20  4:50 PM   Specimen: CSF; Cerebrospinal Fluid  Result Value Ref Range Status   Specimen Description CSF  Final   Special Requests NONE  Final   Gram Stain   Final    ABUNDANT WBC PRESENT,BOTH PMN AND MONONUCLEAR NO ORGANISMS SEEN    Culture   Final    NO GROWTH Performed at [facility], [place] [address] [place], [place] [address]    Report Status 04/29/2020 FINAL  Final  Culture, fungus without smear  Status: None (Preliminary result)   Collection Time: 04/25/20  4:50 PM   Specimen: CSF; Cerebrospinal Fluid  Result Value Ref Range Status   Specimen Description CSF  Final   Special Requests NONE  Final   Culture   Final    No Fungi Isolated in 4 Weeks Performed at [place] [address], [place], [place] [address]    Report Status PENDING  Incomplete  Anaerobic culture     Status: None (Preliminary result)   Collection Time: 04/25/20  8:00 PM   Specimen: CSF; Cerebrospinal Fluid  Result Value Ref Range Status   Specimen Description CSF TUBE 3  Final   Special Requests NONE  Final   Gram Stain   Final    ABUNDANT WBC PRESENT,BOTH PMN AND MONONUCLEAR NO ORGANISMS SEEN    Culture   Final    NO GROWTH 5 DAYS Performed at [facility], [address], [place], [place] [address]    Report Status PENDING  Incomplete  Culture, blood (routine x 2)     Status: None (Preliminary result)   Collection  Time: 04/26/20 12:59 AM   Specimen: BLOOD  Result Value Ref Range Status   Specimen Description BLOOD RIGHT ARM  Final   Special Requests   Final    BOTTLES DRAWN AEROBIC AND ANAEROBIC Blood Culture adequate volume   Culture   Final    NO GROWTH 4 DAYS Performed at [facility], [place] [address], [place], [place] [address]    Report Status PENDING  Incomplete  Culture, blood (routine x 2)     Status: None (Preliminary result)   Collection Time: 04/26/20  1:04 AM   Specimen: BLOOD  Result Value Ref Range Status   Specimen Description BLOOD RIGHT HAND  Final   Special Requests   Final    BOTTLES DRAWN AEROBIC AND ANAEROBIC Blood Culture adequate volume   Culture   Final    NO GROWTH 4 DAYS Performed at [facility], [place] [address], [place], [place] [address]    Report Status PENDING  Incomplete  Aerobic/Anaerobic Culture (surgical/deep wound)     Status: None (Preliminary result)   Collection Time: 04/30/20 10:13 AM   Specimen: Bronchoalveolar Lavage  Result Value Ref Range Status   Specimen Description BRONCHIAL ALVEOLAR LAVAGE  Final   Special Requests RIGHT MIDDLE LOBE  Final   Gram Stain   Final    MODERATE WBC PRESENT,BOTH PMN AND MONONUCLEAR NO ORGANISMS SEEN Performed at [facility], [place] [address], [place], [place] [address]    Culture PENDING  Incomplete   Report Status PENDING  Incomplete    Impression/Plan:  1. Meningitis - he continues to feel improved and no new concerns.  Quantiferon negative, AFB smear negative.  Will continue to follow.   2.  Adenopathy - s/p biopsy today.  Will monitor.

## 2020-04-30 NOTE — Anesthesia Postprocedure Evaluation (Signed)
Anesthesia Post Note  Patient: Aaron Chapman  Procedure(s) Performed: VIDEO BRONCHOSCOPY WITHOUT FLUORO (N/A ) ENDOBRONCHIAL ULTRASOUND (N/A ) BRONCHIAL WASHINGS BRONCHIAL NEEDLE ASPIRATION BIOPSIES     Patient location during evaluation: PACU Anesthesia Type: General Level of consciousness: awake and alert and oriented Pain management: pain level controlled Vital Signs Assessment: post-procedure vital signs reviewed and stable Respiratory status: spontaneous breathing, nonlabored ventilation and respiratory function stable Cardiovascular status: blood pressure returned to baseline and stable Postop Assessment: no apparent nausea or vomiting Anesthetic complications: no   No complications documented.  Last Vitals:  Vitals:   04/30/20 1102 04/30/20 1110  BP:  (!) 140/91  Pulse:  (!) 105  Resp:  14  Temp:    SpO2: 97% 98%    Last Pain:  Vitals:   04/30/20 1059  TempSrc: Oral  PainSc: 0-No pain                 Calleen Alvis A.

## 2020-05-01 ENCOUNTER — Inpatient Hospital Stay (HOSPITAL_COMMUNITY): Payer: Self-pay

## 2020-05-01 DIAGNOSIS — I38 Endocarditis, valve unspecified: Secondary | ICD-10-CM

## 2020-05-01 LAB — CULTURE, BLOOD (ROUTINE X 2)
Culture: NO GROWTH
Culture: NO GROWTH
Special Requests: ADEQUATE
Special Requests: ADEQUATE

## 2020-05-01 LAB — ECHOCARDIOGRAM COMPLETE
Height: 73 in
S' Lateral: 2.95 cm
Weight: 2776.03 oz

## 2020-05-01 LAB — BASIC METABOLIC PANEL
Anion gap: 10 (ref 5–15)
BUN: 9 mg/dL (ref 6–20)
CO2: 25 mmol/L (ref 22–32)
Calcium: 8.8 mg/dL — ABNORMAL LOW (ref 8.9–10.3)
Chloride: 97 mmol/L — ABNORMAL LOW (ref 98–111)
Creatinine, Ser: 0.77 mg/dL (ref 0.61–1.24)
GFR calc Af Amer: >60 mL/min (ref >60)
GFR calc non Af Amer: >60 mL/min (ref >60)
Glucose, Bld: 105 mg/dL — ABNORMAL HIGH (ref 70–99)
Potassium: 4.8 mmol/L (ref 3.5–5.1)
Sodium: 132 mmol/L — ABNORMAL LOW (ref 135–145)

## 2020-05-01 LAB — OLIGOCLONAL BANDS, CSF + SERM

## 2020-05-01 LAB — CBC
HCT: 40.7 % (ref 39.0–52.0)
Hemoglobin: 13.9 g/dL (ref 13.0–17.0)
MCH: 30.8 pg (ref 26.0–34.0)
MCHC: 34.2 g/dL (ref 30.0–36.0)
MCV: 90.2 fL (ref 80.0–100.0)
Platelets: 244 10*3/uL (ref 150–400)
RBC: 4.51 MIL/uL (ref 4.22–5.81)
RDW: 12.7 % (ref 11.5–15.5)
WBC: 5.2 10*3/uL (ref 4.0–10.5)
nRBC: 0 % (ref 0.0–0.2)

## 2020-05-01 LAB — MAGNESIUM: Magnesium: 2 mg/dL (ref 1.7–2.4)

## 2020-05-01 LAB — ACID FAST SMEAR (AFB, MYCOBACTERIA): Acid Fast Smear: NEGATIVE

## 2020-05-01 IMAGING — CR DG CHEST 2V
2 series · 2 of 2 positions shown · non-contrast
Comparison: Chest CT [DATE]

CLINICAL DATA: Mediastinal lymphadenopathy

EXAM:
CHEST - 2 VIEW

[chest lat]
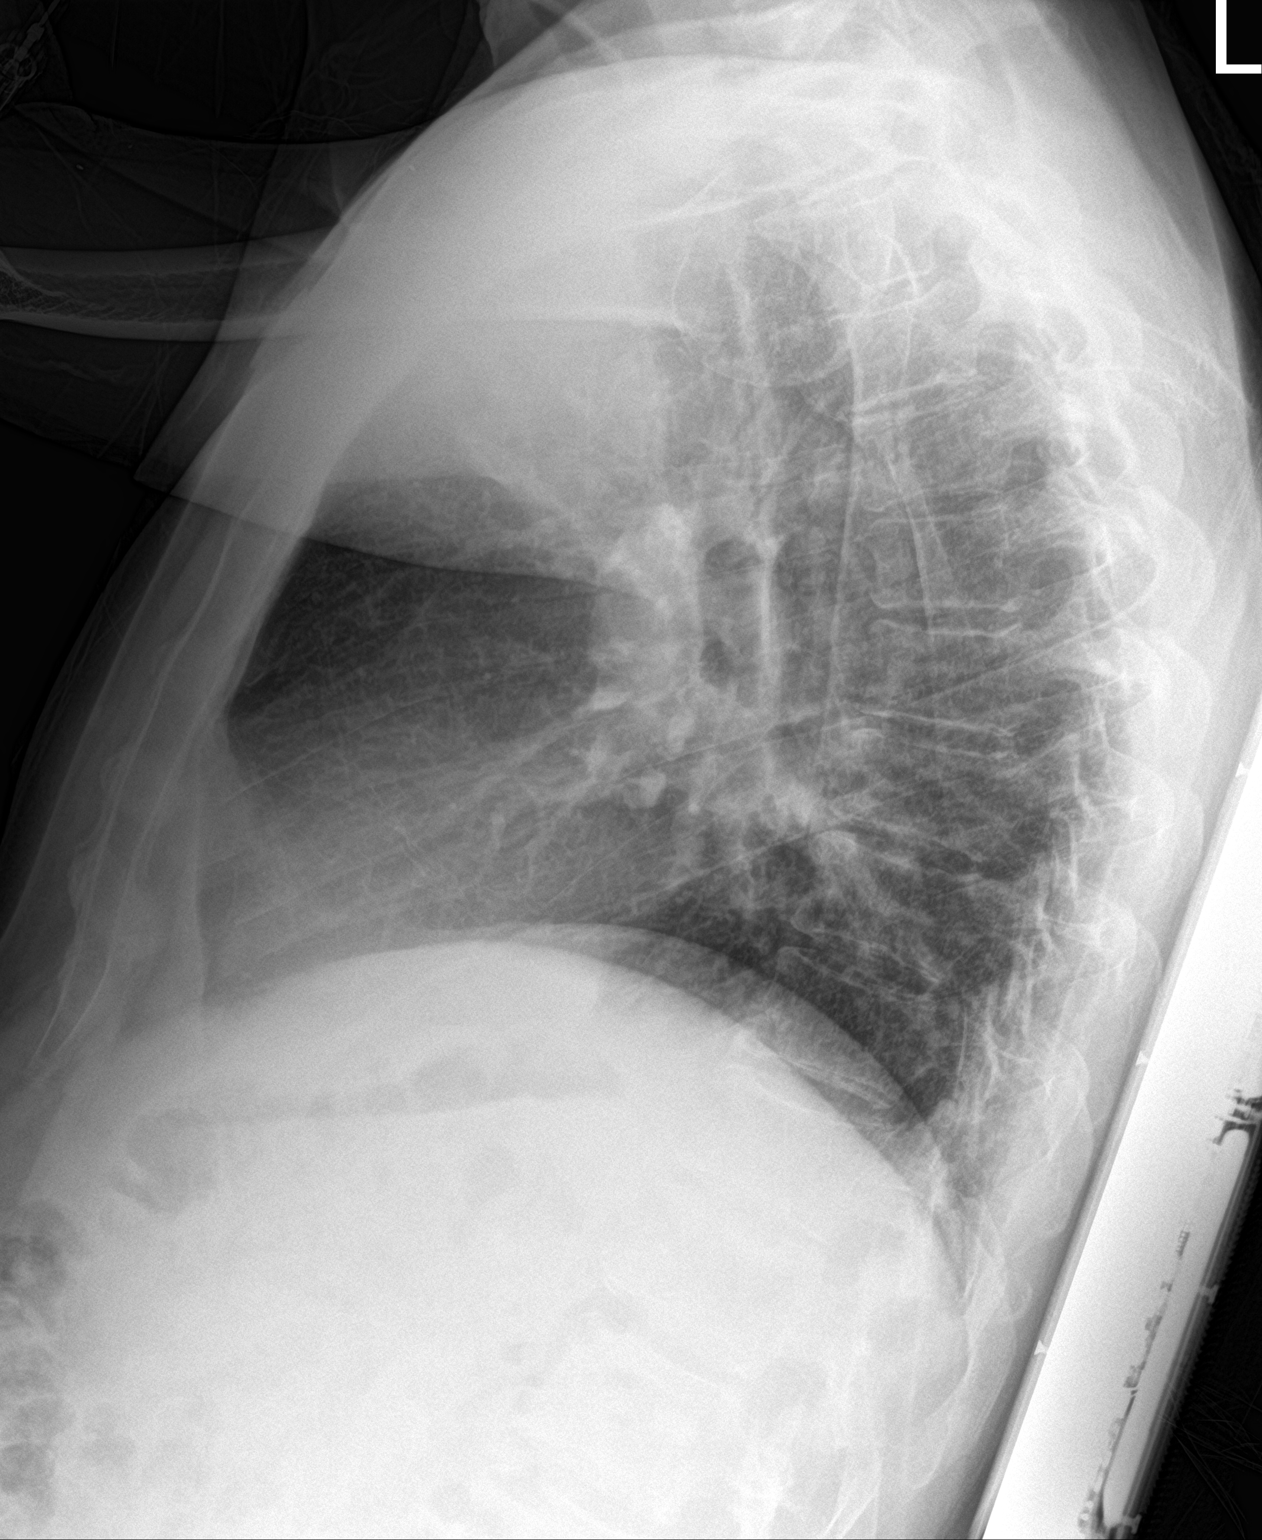

[chest ap]
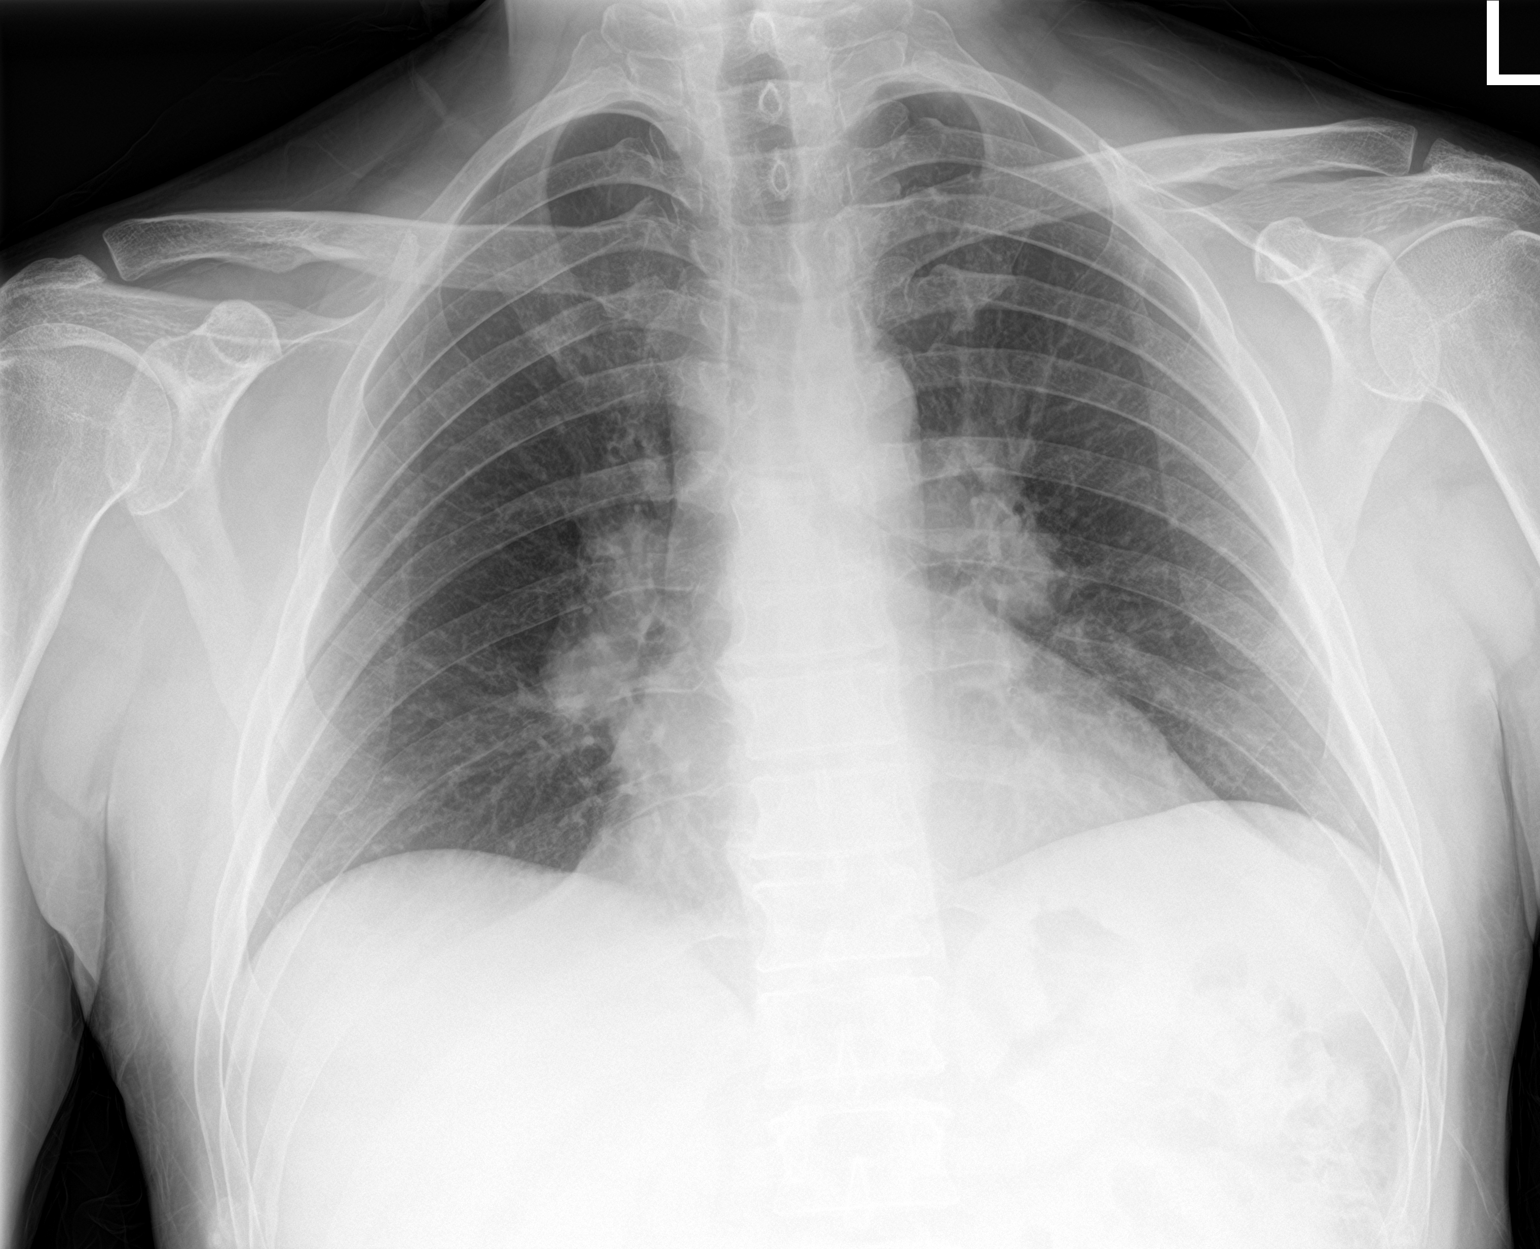

[2 of 2 positions shown; findings below may reference images not displayed]

FINDINGS: Stable cardiac silhouette. Again demonstrated bilateral prominence
of the hilar structures consistent with hilar lymphadenopathy. No
interval change. No lung parenchymal abnormality identified.
IMPRESSION: Persistent bilateral hilar lymphadenopathy. Common differential
would include sarcoidosis versus lymphoma.

## 2020-05-01 IMAGING — CT CT ANGIO HEAD-NECK
1 of 11 series · 14 of 47 positions shown · IV contrast (OMNI)
Comparison: Brain MRI earlier today. Brain MRI and intracranial MRA
[DATE]. chest CT [DATE].

CLINICAL DATA: 44-year-old male with abnormal MRIs recently
suspicious for basilar meningitis, cerebritis. Ectatic left MCA M2
posterior branch, query mycotic aneurysm.

Status post [EI] 1 month ago. Status post bronchoscopy with
bronchoscopic biopsy of mediastinal lymphadenopathy yesterday.
Pathology results pending.
EXAM:
CT ANGIOGRAPHY HEAD AND NECK
TECHNIQUE: Multidetector CT imaging of the head and neck was performed using
the standard protocol during bolus administration of intravenous
contrast. Multiplanar CT image reconstructions and MIPs were
obtained to evaluate the vascular anatomy. Carotid stenosis
measurements (when applicable) are obtained utilizing NASCET
criteria, using the distal internal carotid diameter as the
denominator.
CONTRAST:  75mL OMNIPAQUE IOHEXOL 350 MG/ML SOLN

[Series 16: thin · axial · 0.49mm/px · z∈[-396,-45]mm · 14 of 811 slices shown]
[im 55/811  brain]
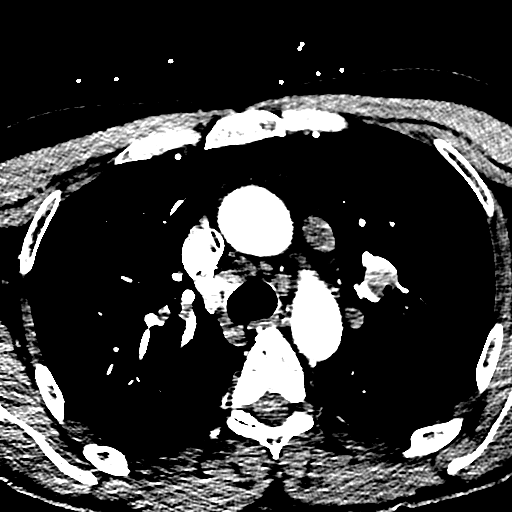
[im 109/811  bone]
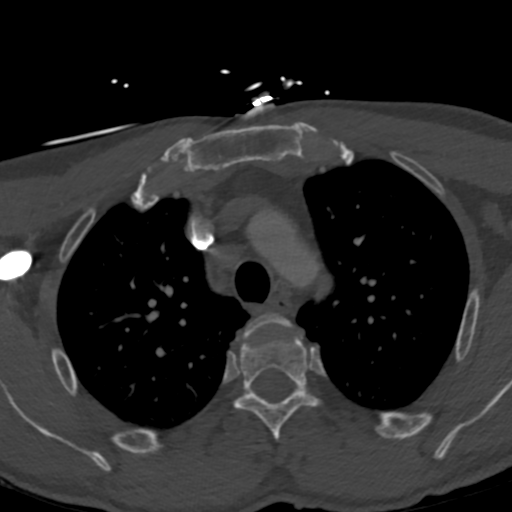
[im 163/811  brain]
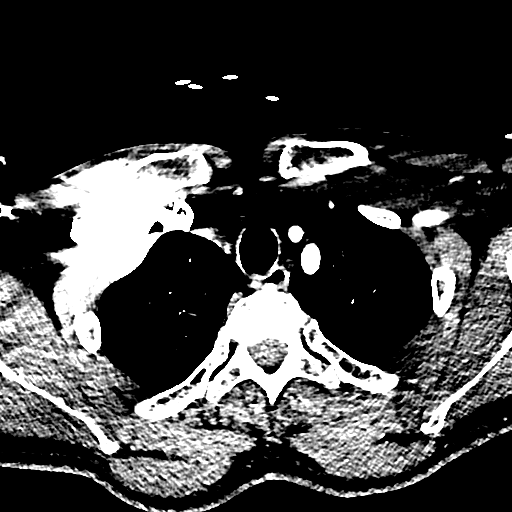
[im 217/811  bone]
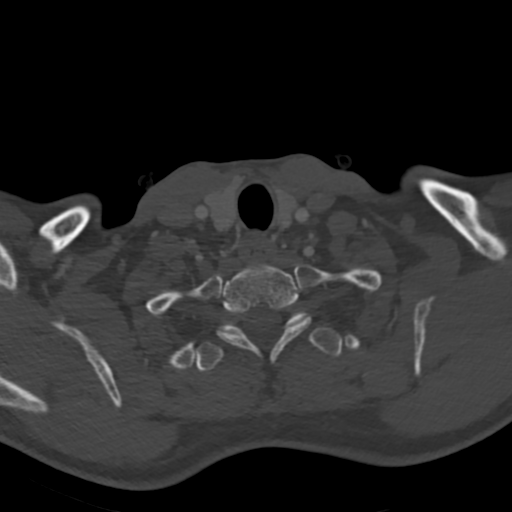
[im 271/811  brain]
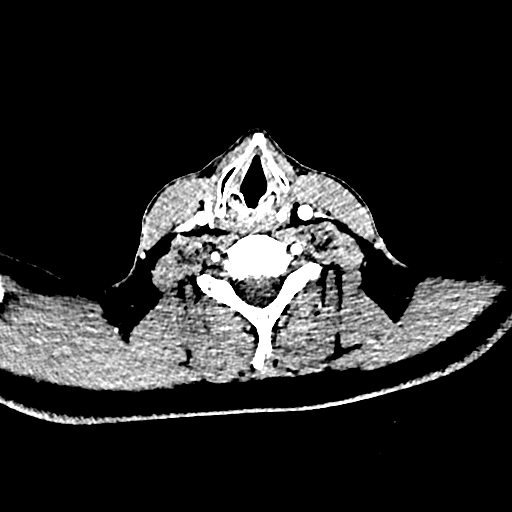
[im 325/811  bone]
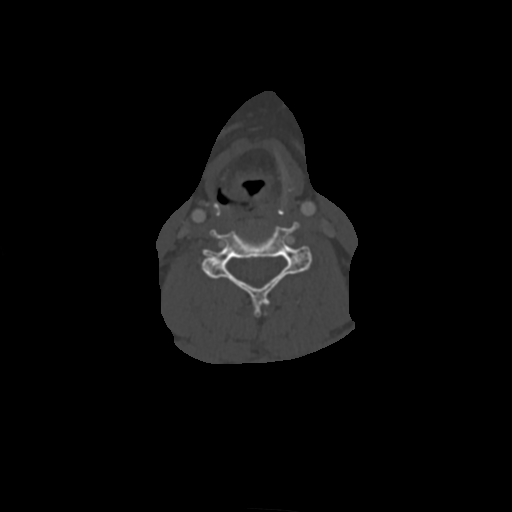
[im 379/811  brain]
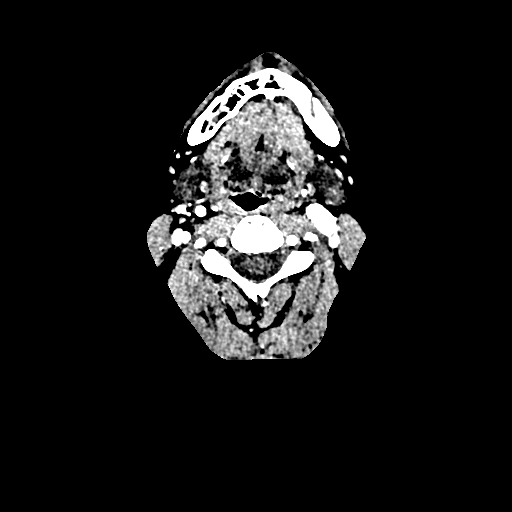
[im 433/811  bone]
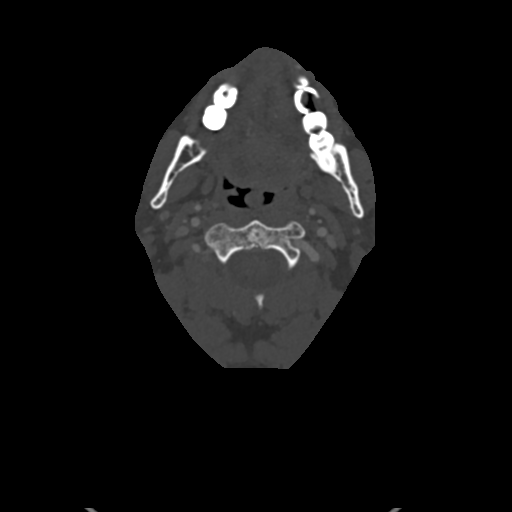
[im 487/811  brain]
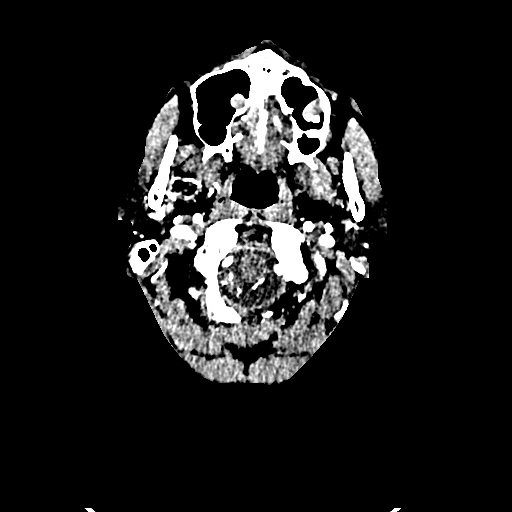
[im 541/811  bone]
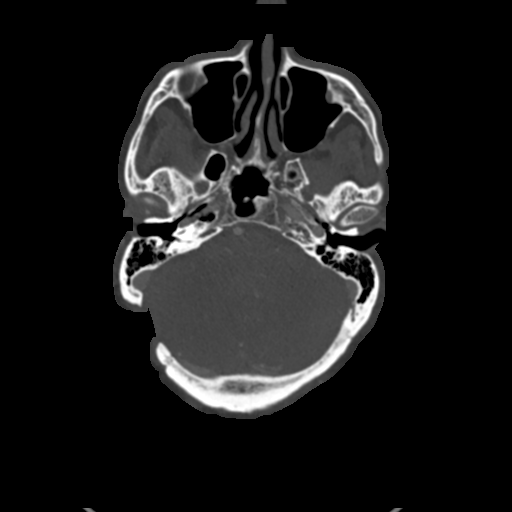
[im 595/811  brain]
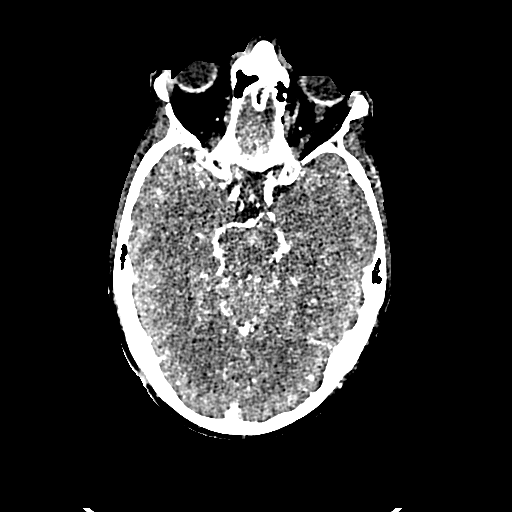
[im 649/811  bone]
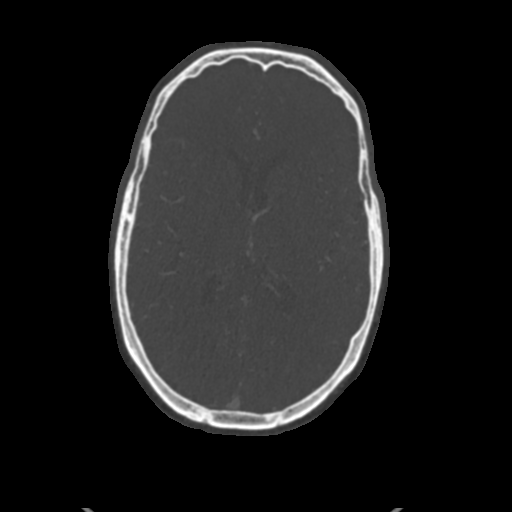
[im 703/811  brain]
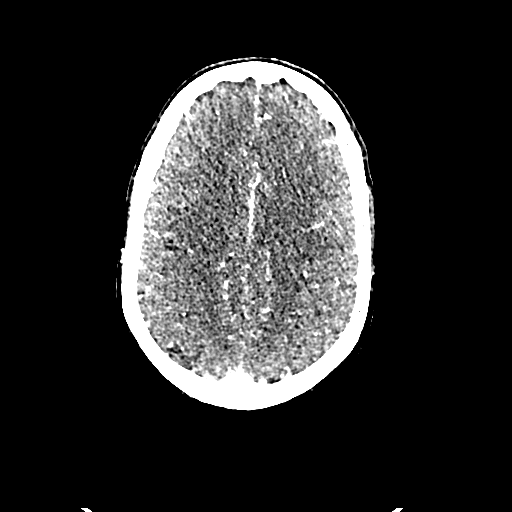
[im 757/811  bone]
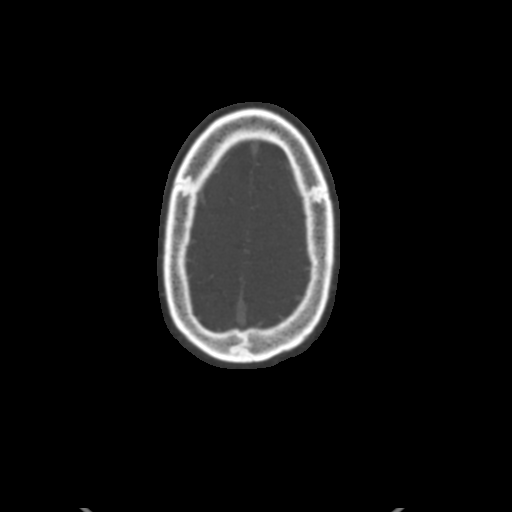

[14 of 47 positions shown; findings below may reference images not displayed]

FINDINGS: CT HEAD

Brain: No intracranial hemorrhage is identified. Largely
unremarkable noncontrast CT appearance of the basilar cisterns. No
posterior fossa encephalomalacia identified on the right although
overlying previous right suboccipital craniectomy (series 5, image
9).

Conspicuous vascular calcification at the left MCA trifurcation
(series 5, image 15 and series 8, image 25), although no other
intracranial calcified plaque is evident.

No ventriculomegaly. No cortically based acute infarct identified.
Gray-white matter differentiation within normal limits.

Calvarium and skull base: Previous right suboccipital craniectomy.
No acute osseous abnormality identified.

Paranasal sinuses: Visualized paranasal sinuses and mastoids are
stable and well pneumatized.

Orbits: No acute orbit or scalp soft tissue finding.

CTA NECK

Skeleton: Carious posterior dentition on the left. Benign-appearing
C6 vertebral body hemangioma. No acute or suspicious osseous lesion
identified.

Upper chest: Mediastinal and hilar lymphadenopathy redemonstrated.
Visible lungs are clear.

Other neck: Negative.  No cervical lymphadenopathy.

Aortic arch: 3 vessel arch configuration.  No arch atherosclerosis.

Right carotid system: Negative.

Left carotid system: Negative.

Vertebral arteries:
Negative.  The left vertebral artery is mildly dominant.

CTA HEAD

Posterior circulation: Distal vertebral arteries are patent, with
mild irregularity of the distal right V4 segment but no distal
vertebral stenosis. Normal vertebrobasilar junction. Both PICA
origins are patent and within normal limits. Patent basilar artery
with mild tortuosity. No basilar stenosis. SCA and PCA origins are
normal. Small posterior communicating arteries. Left PCA branches
are within normal limits. Right PCA branches are mildly irregular
(series 14, image 21).

Other findings: Probable increased posterior fossa leptomeningeal
enhancement (series 15, image 56).

Anterior circulation: Both ICA siphons are patent with no plaque or
stenosis identified. Normal ophthalmic and posterior communicating
artery origins. Patent carotid termini. Patent MCA and ACA origins.

Anterior communicating artery and bilateral ACA branches are within
normal limits.

Left MCA M1 segment is within normal limits. The left MCA
trifurcation is ectatic (series 13 images 149 and 150) and there is
a small calcification associated along the superior distal M1 (image
148). The vessel enlargement is more fusiform and saccular, although
there does appear to be a subtle saccular component directed
anteriorly and superiorly on series 13, image 151. The origin of all
M2 branches are incorporated. Elsewhere the dominant posterior M2 to
M3 branching also appears irregular on series 14, image 32. And
other left MCA branches are also irregular on series 14, image 13.

Contralateral right MCA M1 segment is mildly irregular (series 12,
image 24) with probable lenticular 0 striate artery infundibulum
rather than aneurysm. Right MCA bifurcation is patent without
stenosis. Right MCA branches appear more normal than those on the
left side (series 14, image 16).

Venous sinuses: Patent. The vein of DEBY and the inferior sagittal
sinus do appear mildly irregular. But there is no venous sinus
filling defect identified.

Anatomic variants: Mildly dominant left vertebral artery.

Review of the MIP images confirms the above findings
IMPRESSION: 1. Positive for intracranial vessel irregularity, most pronounced at
the the left MCA and right PCA branches, suspicious for Acute
Vasculitis.

2. The left MCA trifurcation is ectatic (mostly fusiform). A
conspicuous vessel calcification is noted there, with no
superimposed atherosclerosis elsewhere in the head and neck.
Although nonspecific this constellation is suspicious for a
developing Mycotic Aneurysm.

3. Evidence of continued posterior fossa leptomeningeal enhancement.
No new abnormality of the brain parenchyma identified by CT. The
patient has previously had a right suboccipital craniotomy of
unclear significance.

4. Mediastinal and hilar lymphadenopathy again noted, recently
biopsied by bronchoscopy.

## 2020-05-01 IMAGING — MR MR HEAD WO/W CM
10 of 12 series · 41 of 48 positions shown · IV contrast (gadavist)
Comparison: MRI of the brain [DATE]

CLINICAL DATA: Brain mass or lesion; follow up.

EXAM:
MRI HEAD WITHOUT AND WITH CONTRAST
TECHNIQUE: Multiplanar, multiecho pulse sequences of the brain and surrounding
structures were obtained without and with intravenous contrast.
CONTRAST:  7.8mL GADAVIST GADOBUTROL 1 MMOL/ML IV SOLN

[Series 3: DWI · axial · 3.0mm · 1.09mm/px · z∈[-30,+120]mm · 9 of 102 slices shown (1 of 4)]
[im 1/102]
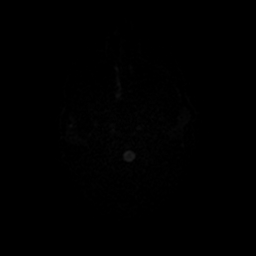
[im 13/102]
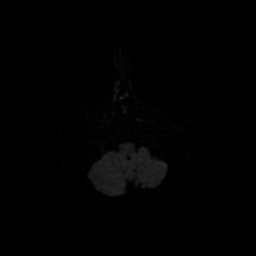
[im 26/102]
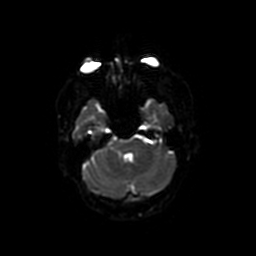
[im 38/102]
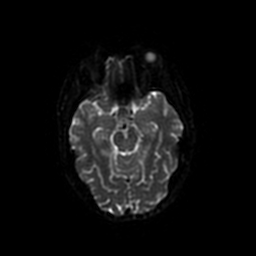
[im 51/102]
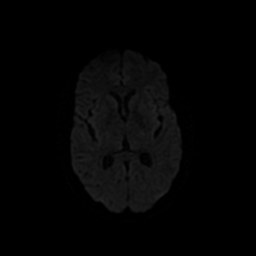
[im 64/102]
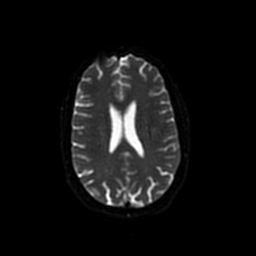
[im 76/102]
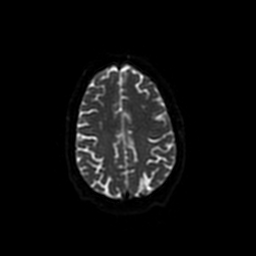
[im 89/102]
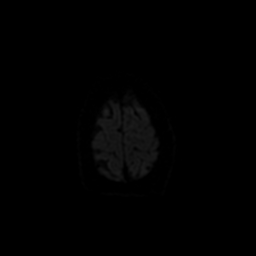
[im 102/102]
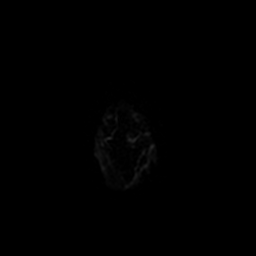

[Series 4: DWI · coronal · 5.0mm · 1.09mm/px · 7 of 76 slices shown (2 of 4)]
[im 1/76]
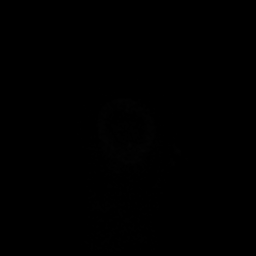
[im 13/76]
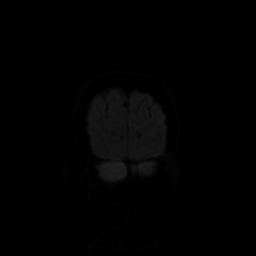
[im 26/76]
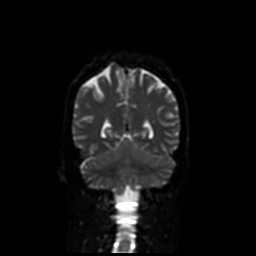
[im 38/76]
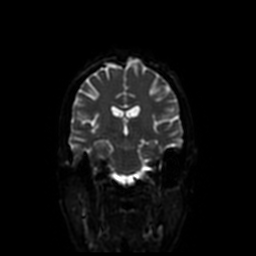
[im 51/76]
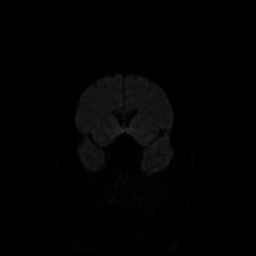
[im 63/76]
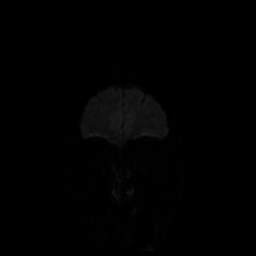
[im 76/76]
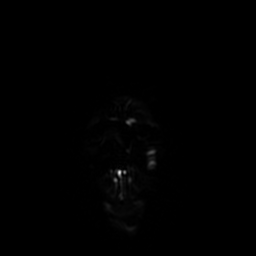

[Series 5: T1 · sagittal · 5.0mm · 0.94mm/px · 2 of 23 slices shown]
[im 1/23]
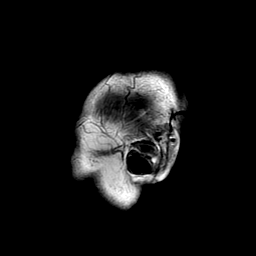
[im 23/23]
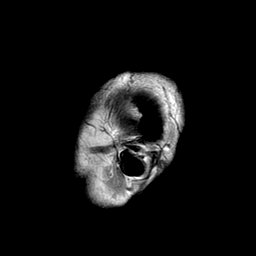

[Series 6: T2 · axial · 5.0mm · 0.43mm/px · z∈[-32,+118]mm · 2 of 26 slices shown]
[im 1/26]
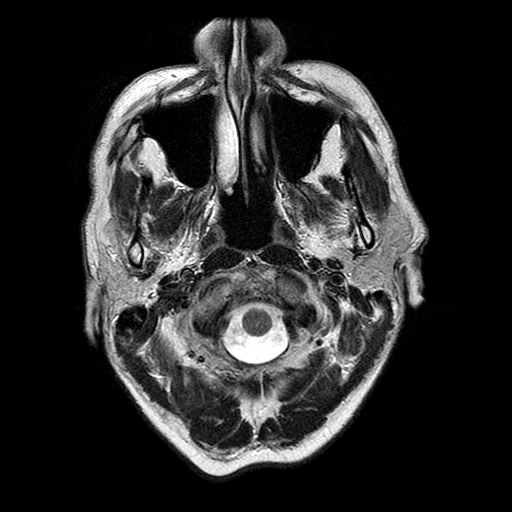
[im 26/26]
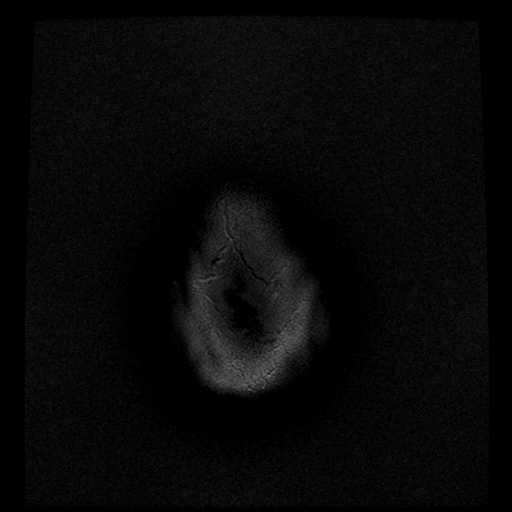

[Series 7: FLAIR · axial · 3.0mm · 0.43mm/px · z∈[-32,+118]mm · 2 of 26 slices shown]
[im 1/26]
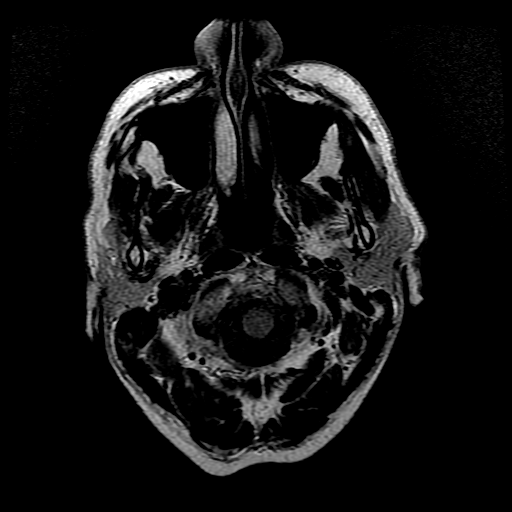
[im 26/26]
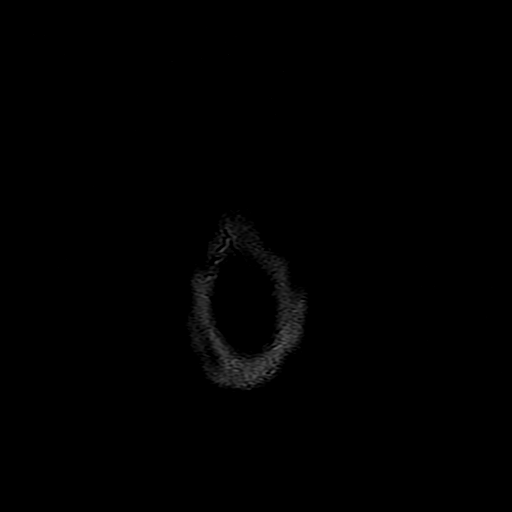

[Series 10: T2 post-contrast · coronal · 5.0mm · 0.78mm/px · 3 of 29 slices shown]
[im 1/29]
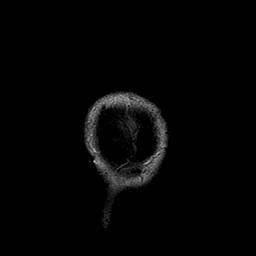
[im 15/29]
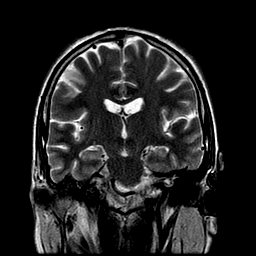
[im 29/29]
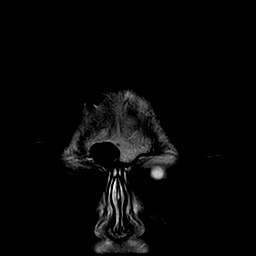

[Series 11: T1 post-contrast · axial · 3.0mm · 0.47mm/px · z∈[-30,+117]mm · 5 of 50 slices shown (1 of 2)]
[im 1/50]
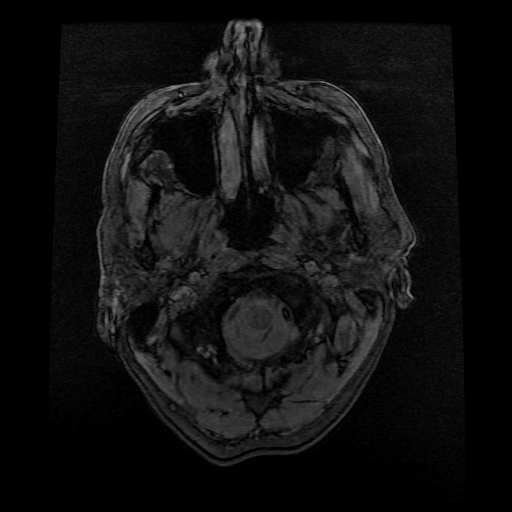
[im 13/50]
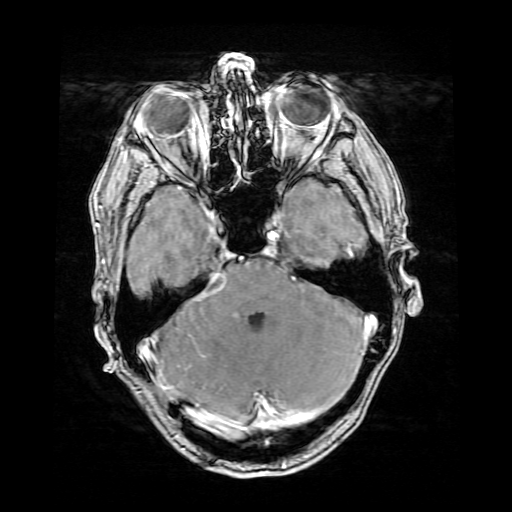
[im 25/50]
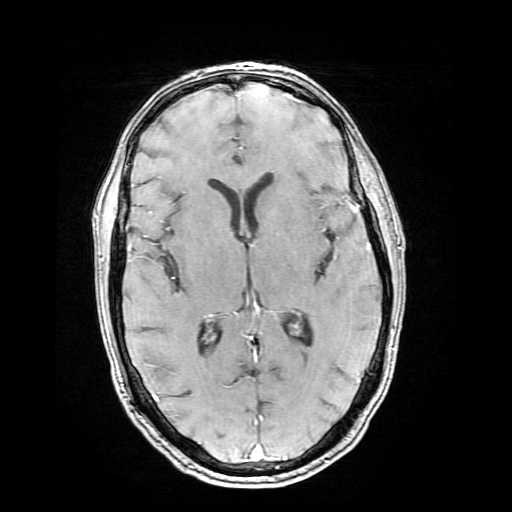
[im 37/50]
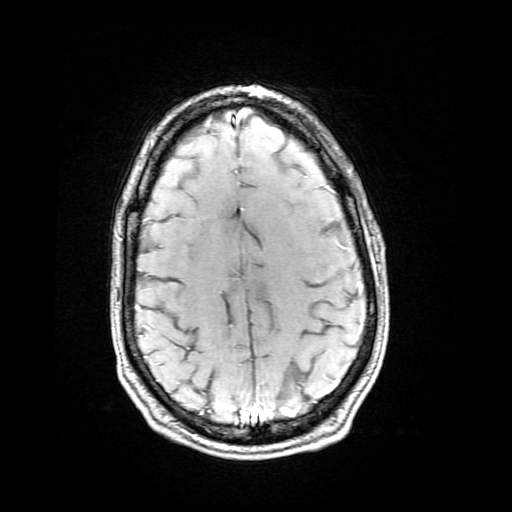
[im 50/50]
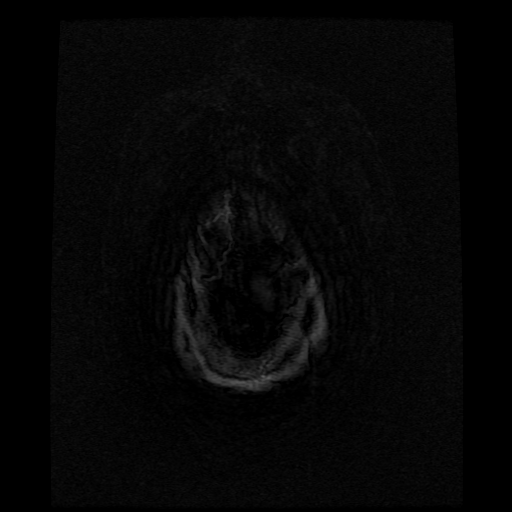

[Series 12: T1 post-contrast · coronal · 5.0mm · 0.78mm/px · 3 of 29 slices shown (2 of 2)]
[im 1/29]
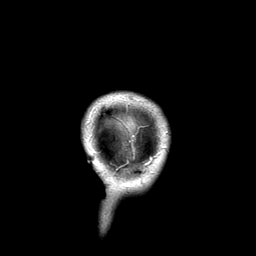
[im 15/29]
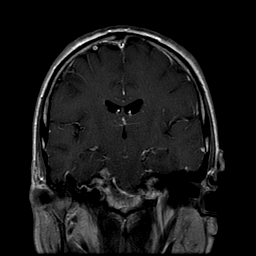
[im 29/29]
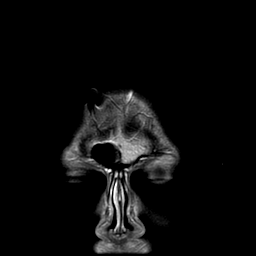

[Series 300: DWI · axial · 3.0mm · 1.09mm/px · z∈[-30,+120]mm · 5 of 51 slices shown (3 of 4)]
[im 1/51]
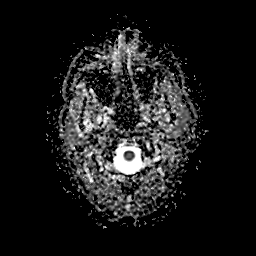
[im 13/51]
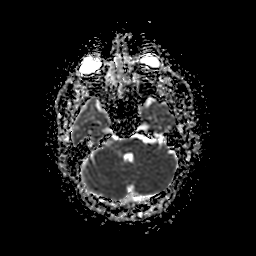
[im 26/51]
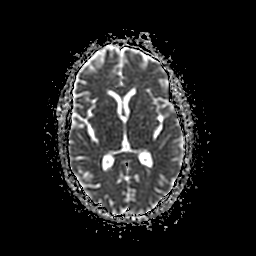
[im 38/51]
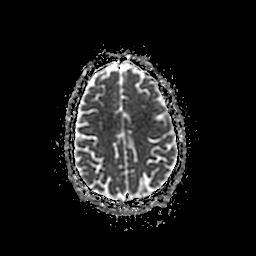
[im 51/51]
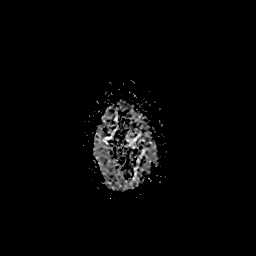

[Series 400: DWI · coronal · 5.0mm · 1.09mm/px · 3 of 38 slices shown (4 of 4)]
[im 1/38]
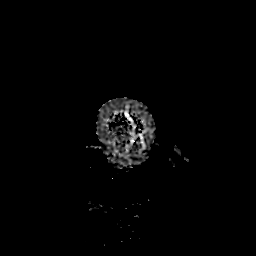
[im 19/38]
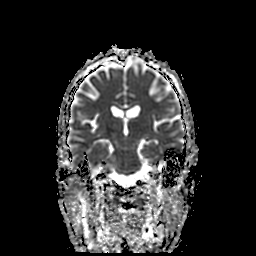
[im 38/38]
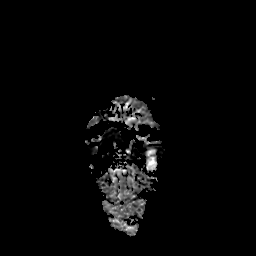

[41 of 48 positions shown; findings below may reference images not displayed]

FINDINGS: Brain: No acute infarction, hemorrhage, hydrocephalus or mass
lesion.

No significant interval change of the leptomeningeal thickening and
contrast enhancement along the bilateral cerebellar sulci, brainstem
surface predominantly on the right side and more pronounced on the
right, extending into the right internal auditory canal. Small
collection in the right cerebellopontine angle cistern, measuring
approximately 1.3 x 0.5 mm, unchanged from prior.

Persistent increased T2 signal within the right middle cerebellar
peduncle, adjacent to the area of left a meningeal enhancement,
likely represent cerebritis, not significantly changed from prior.

Vascular: Flow voids are preserved. Ectatic left MCA/M2 posterior
division branch (series 6, images 12 and 13). Given ongoing
infectious process, evaluation with CT angiogram is suggested to
exclude mycotic aneurysm.

Skull and upper cervical spine: Normal marrow signal.

Sinuses/Orbits: Negative.

Other: Small left mastoid effusion.
IMPRESSION: 1. No significant interval change of the basal leptomeningeal
process with small collection in the right cerebellopontine angle
and associated right middle cerebellar peduncle cerebritis.
2. Ectatic left MCA/M2 posterior division branch. Given ongoing
infectious process, evaluation with CT angiogram is suggested to
exclude mycotic aneurysm.

These results will be called to the ordering clinician or
representative by the Radiologist Assistant, and communication
documented in the PACS or [REDACTED].

## 2020-05-01 MED ORDER — SENNOSIDES-DOCUSATE SODIUM 8.6-50 MG PO TABS: 1.0000 | ORAL_TABLET | Freq: Every evening | ORAL | Status: DC | PRN

## 2020-05-01 MED ORDER — OXYCODONE HCL 5 MG PO TABS
5.0000 mg | ORAL_TABLET | ORAL | Status: DC | PRN
  Administered 2020-05-01 – 2020-05-02 (×3): 5 mg via ORAL
  Filled 2020-05-01 (×3): qty 1

## 2020-05-01 MED ORDER — GADOBUTROL 1 MMOL/ML IV SOLN
7.8000 mL | Freq: Once | INTRAVENOUS | Status: AC | PRN
  Administered 2020-05-01: 7.8 mL via INTRAVENOUS

## 2020-05-01 MED ORDER — SODIUM CHLORIDE 0.9 % IV SOLN: INTRAVENOUS | Status: DC

## 2020-05-01 MED ORDER — IBUPROFEN 400 MG PO TABS
400.0000 mg | ORAL_TABLET | Freq: Once | ORAL | Status: AC
  Administered 2020-05-01: 400 mg via ORAL
  Filled 2020-05-01: qty 1

## 2020-05-01 MED ORDER — IOHEXOL 350 MG/ML SOLN
75.0000 mL | Freq: Once | INTRAVENOUS | Status: AC | PRN
  Administered 2020-05-01: 75 mL via INTRAVENOUS

## 2020-05-01 MED ORDER — TRAZODONE HCL 50 MG PO TABS
50.0000 mg | ORAL_TABLET | Freq: Every evening | ORAL | Status: DC | PRN
  Administered 2020-05-04: 50 mg via ORAL
  Filled 2020-05-01: qty 1

## 2020-05-01 NOTE — Progress Notes (Signed)
Developed a fever yesterday.  No acute events otherwise noted.  I will repeat the MRI for change/improvement and also recheck the CXR.   Gardiner Barefoot, MD

## 2020-05-01 NOTE — Progress Notes (Signed)
[facility] for Infectious Disease   Reason for visit: Follow up on meningitis  Interval History: fever; MRI with possible aneurysm.  Patient currently getting a CTA to confirm.  Repeat MRI otherwise unchanged, not better.  CXR with same adenopathy. Daughter in room   Physical Exam: Constitutional:  Vitals:   05/01/20 0425 05/01/20 0915  BP: 135/86 119/76  Pulse: (!) 105 82  Resp: 18 18  Temp: 98.9 F (37.2 C) 98.5 F (36.9 C)  SpO2: 95% 96%  patient currently in CT  Review of Systems: Unable to be assessed due to patient factors  Lab Results  Component Value Date   WBC 5.2 05/01/2020   HGB 13.9 05/01/2020   HCT 40.7 05/01/2020   MCV 90.2 05/01/2020   PLT 244 05/01/2020    Lab Results  Component Value Date   CREATININE 0.77 05/01/2020   BUN 9 05/01/2020   NA 132 (L) 05/01/2020   K 4.8 05/01/2020   CL 97 (L) 05/01/2020   CO2 25 05/01/2020    Lab Results  Component Value Date   ALT 25 04/29/2020   AST 13 (L) 04/29/2020   ALKPHOS 36 (L) 04/29/2020     Microbiology: Recent Results (from the past 240 hour(s))  Acid Fast Smear (AFB)     Status: None   Collection Time: 04/21/20  4:50 PM   Specimen: CSF; Cerebrospinal Fluid  Result Value Ref Range Status   AFB Specimen Processing Concentration  Final   Acid Fast Smear Negative  Final    Comment: (NOTE) Performed At: [facility] [address] [place], [place] [id number] Rush Farmer MD [id number]    Source (AFB) CSF  Final    Comment: Performed at [facility], [place] [address], [place], [place] [address]  Blood culture (routine x 2)     Status: None   Collection Time: 04/22/20  2:10 AM   Specimen: BLOOD RIGHT HAND  Result Value Ref Range Status   Specimen Description BLOOD RIGHT HAND  Final   Special Requests   Final    BOTTLES DRAWN AEROBIC AND ANAEROBIC Blood Culture adequate volume   Culture   Final    NO GROWTH 5 DAYS Performed at [facility], [place] [address]  [place], [place] [address]    Report Status 04/27/2020 FINAL  Final  Blood culture (routine x 2)     Status: None   Collection Time: 04/22/20  2:20 AM   Specimen: BLOOD LEFT ARM  Result Value Ref Range Status   Specimen Description BLOOD LEFT ARM  Final   Special Requests   Final    BOTTLES DRAWN AEROBIC AND ANAEROBIC Blood Culture adequate volume   Culture   Final    NO GROWTH 5 DAYS Performed at [facility], [place] [address], [place], [place] [address]    Report Status 04/27/2020 FINAL  Final  CSF culture     Status: None   Collection Time: 04/25/20  4:50 PM   Specimen: CSF; Cerebrospinal Fluid  Result Value Ref Range Status   Specimen Description CSF  Final   Special Requests NONE  Final   Gram Stain   Final    ABUNDANT WBC PRESENT,BOTH PMN AND MONONUCLEAR NO ORGANISMS SEEN    Culture   Final    NO GROWTH Performed at [facility], [place] [address], [place],  [address]    Report Status 04/29/2020 FINAL  Final  Culture, fungus without smear  Status: None (Preliminary result)   Collection Time: 04/25/20  4:50 PM   Specimen: CSF; Cerebrospinal Fluid  Result Value Ref Range Status   Specimen Description CSF  Final   Special Requests NONE  Final   Culture   Final    NO FUNGUS ISOLATED AFTER 5 DAYS Performed at [facility], [place] [address], [place], [place] [address]    Report Status PENDING  Incomplete  Anaerobic culture     Status: None (Preliminary result)   Collection Time: 04/25/20  8:00 PM   Specimen: CSF; Cerebrospinal Fluid  Result Value Ref Range Status   Specimen Description CSF TUBE 3  Final   Special Requests NONE  Final   Gram Stain   Final    ABUNDANT WBC PRESENT,BOTH PMN AND MONONUCLEAR NO ORGANISMS SEEN    Culture   Final    NO GROWTH 5 DAYS Performed at [facility], [address], [place], [place] [address]    Report Status PENDING  Incomplete  Culture, blood (routine x 2)     Status: None   Collection Time: 04/26/20  12:59 AM   Specimen: BLOOD  Result Value Ref Range Status   Specimen Description BLOOD RIGHT ARM  Final   Special Requests   Final    BOTTLES DRAWN AEROBIC AND ANAEROBIC Blood Culture adequate volume   Culture   Final    NO GROWTH 5 DAYS Performed at [facility], [address], [place], [place] [address]    Report Status 05/01/2020 FINAL  Final  Culture, blood (routine x 2)     Status: None   Collection Time: 04/26/20  1:04 AM   Specimen: BLOOD  Result Value Ref Range Status   Specimen Description BLOOD RIGHT HAND  Final   Special Requests   Final    BOTTLES DRAWN AEROBIC AND ANAEROBIC Blood Culture adequate volume   Culture   Final    NO GROWTH 5 DAYS Performed at [facility], [place] [address], [place], [place] [address]    Report Status 05/01/2020 FINAL  Final  Acid Fast Smear (AFB)     Status: None   Collection Time: 04/30/20 10:13 AM   Specimen: Bronchial Alveolar Lavage; Respiratory  Result Value Ref Range Status   AFB Specimen Processing Concentration  Final   Acid Fast Smear Negative  Final    Comment: (NOTE) Performed At: [facility] [place], [place] [id number] Rush Farmer MD [id number]    Source (AFB) BRONCHIAL ALVEOLAR LAVAGE  Final    Comment: Performed at [facility], [place] [address], [place], [place] [address]  Aerobic/Anaerobic Culture (surgical/deep wound)     Status: None (Preliminary result)   Collection Time: 04/30/20 10:13 AM   Specimen: Bronchoalveolar Lavage  Result Value Ref Range Status   Specimen Description BRONCHIAL ALVEOLAR LAVAGE  Final   Special Requests RIGHT MIDDLE LOBE  Final   Gram Stain   Final    MODERATE WBC PRESENT,BOTH PMN AND MONONUCLEAR NO ORGANISMS SEEN    Culture   Final    CULTURE REINCUBATED FOR BETTER GROWTH Performed at [facility], [place] [address], [place], [place] [address]    Report Status PENDING  Incomplete    Impression/Plan:  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  This is most consistent with neurosarcoidosis.  I discussed the findings at length with  Dr. Maryan Char of neurology who is in agreement and felt to be fairly typical presentation and findings for neurosarcoidosis.  He recommended to continue with the steroids and to follow up with Dr. Felecia Shelling of neurology as an outpatient.  Continue with the same dose of steroids and no indication for further antibiotics.  2.  ? Infectious meningitis - as above, no findings including a negative AFB smear in the CSF and aspirate, no positive culture growth and negative Quantiferon Gold test.  Not c/w infectious process and have stopped the antibiotics.    3.  Disposition - he will need follow up as above with neurology and also a primary care doctor.  The daughter also is requesting a letter regarding the patients illness for a family member coming from [place].     OK from ID standpoint for discharge pending results of the CTA.    Above discussed at length with the patients daughter and all questions answered.

## 2020-05-01 NOTE — Progress Notes (Signed)
  Echocardiogram 2D Echocardiogram has been performed.  A[facility] 05/01/2020, 2:05 PM

## 2020-05-01 NOTE — Progress Notes (Signed)
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  Outpatient he had received a course of steroids and completed course of Augmentin.  Due to persistent fever had CT chest which showed right lower lobe nodule, left lower lobe nodule and bilateral hilar lymphadenopathy.  Some focal sclerosing lesion in the L1 vertebral body.  Later also developed truncal and bilateral upper and lower extremity rash.  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  ID consulted and started on steroids and RIPE therapy.  Pulmonary team was consulted and underwent bronchoscopy/EBUS on 9/30.  Thereafter spiked fever overnight.   Assessment & Plan:   Active Problems:   Fever and chills   Bilateral headaches   Meningitis  Recurrent fevers with headaches.  Suspicion TB meningitis Bilateral hilar lymphadenopathy/mediastinal lymphadenopathy Bilateral pulmonary nodules -Patient has been started on RIPE therapy and steroids.  There is also concerns of lymphoma/sarcoidosis. -CSF studies-for AFB-negative -HIV-negative -QuantiFERON test-pending.  CSF AFB smears -CT chest without contrast 9/27-multifocal mediastinal/hilar adenopathy, calcification of right hilar nodes, granulomatous disease?  3-4 mm stable nodule.  -Pulmonary team consulted.  Status post bronc/EBUS.  Biopsy pending -Due to recurrent fever, repeat chest x-ray and MRI brain per infectious disease   DVT prophylaxis: heparin injection 5,000 Units Start: 04/26/20 1400 Code Status: Full code Family Communication:    Status is: Inpatient  Remains inpatient appropriate because:Inpatient level of care appropriate due to severity of illness  Dispo: The patient is from: Home              Anticipated d/c is  to: Home              Anticipated d/c date is: > 3 days              Patient currently is not medically stable to d/c.  Ongoing treatment for TB meningitis.  Maintain hospital stay until cleared by infectious disease.   Body mass index is 22.89 kg/m.  Subjective: Patient feels little tired this morning but no other complaints.  Review of Systems Otherwise negative except as per HPI, including: General: Denies fever, chills, night sweats or unintended weight loss. Resp: Denies cough, wheezing, shortness of breath. Cardiac: Denies chest pain, palpitations, orthopnea, paroxysmal nocturnal dyspnea. GI: Denies abdominal pain, nausea, vomiting, diarrhea or constipation GU: Denies dysuria, frequency, hesitancy or incontinence MS: Denies muscle aches, joint pain or swelling Neuro: Denies headache, neurologic deficits (focal weakness, numbness, tingling), abnormal gait Psych: Denies anxiety, depression, SI/HI/AVH Skin: Denies new rashes or lesions ID: Denies sick contacts, exotic exposures, travel  Examination:  Constitutional: Not in acute distress Respiratory: Clear to auscultation bilaterally Cardiovascular: Normal sinus rhythm, no rubs Abdomen: Nontender nondistended good bowel sounds Musculoskeletal: No edema noted Skin: No rashes seen Neurologic: CN 2-12 grossly intact.  And nonfocal Psychiatric: Normal judgment and insight. Alert and oriented x 3. Normal mood.  Objective: Vitals:   04/30/20 2013 05/01/20 0003 05/01/20 0425 05/01/20 0915  BP: 120/88 132/78 135/86 119/76  Pulse: (!) 110 (!) 126 (!) 105 82  Resp: _0 Temp: 99.4 F (37.4 C) 99.8 F (37.7 C) 98.9 F (37.2 C) 98.5 F (36.9 C)  TempSrc: Oral Oral Oral Oral  SpO2: 98% 97% 95% 96%  Weight: 78.7 kg  Height:        Intake/Output Summary (Last 24 hours) at 05/01/2020 1104 Last data filed at 05/01/2020 0757 Gross per 24 hour  Intake 2422.87 ml  Output 600 ml  Net 1822.87 ml   Filed Weights    04/27/20 0635 04/30/20 0910 04/30/20 2013  Weight: 79.2 kg 75 kg 78.7 kg     Data Reviewed:   CBC: Recent Labs  Lab 04/25/20 1118 04/26/20 1620 05/01/20 0806  WBC 4.5 7.5 5.2  NEUTROABS 2.9  --   --   HGB 15.1 13.8 13.9  HCT 44.8 41.2 40.7  MCV 93.1 91.4 90.2  PLT 200 210 845   Basic Metabolic Panel: Recent Labs  Lab 04/26/20 1620 04/27/20 0438 04/28/20 0736 04/29/20 1122 04/30/20 0852 05/01/20 0806  NA 137 135 134* 132*  --  132*  K 4.4 4.7 4.6 4.5  --  4.8  CL 99 103 101 96*  --  97*  CO2 _0 --  25  GLUCOSE 131* 102* 98 110*  --  105*  BUN _1 --  9  CREATININE 0.77 0.68 0.90 0.91  --  0.77  CALCIUM 9.1 9.1 9.1 9.5  --  8.8*  MG  --  1.9 1.8 2.0 2.0 2.0   GFR: Estimated Creatinine Clearance: 128.4 mL/min (by C-G formula based on SCr of 0.77 mg/dL). Liver Function Tests: Recent Labs  Lab 04/25/20 1118 04/25/20 2000 04/26/20 1620 04/27/20 0438 04/28/20 0736 04/29/20 1122  AST 18  --  15 14* 12* 13*  ALT 28  --  _2 ALKPHOS 35*  --  38 35* 39 36*  BILITOT 0.9  --  1.7* 1.0 1.0 1.0  PROT 7.0  --  6.4* 6.6 6.6 6.8  ALBUMIN 3.8 3.9* 3.4* 3.5 3.5 3.6   No results for input(s): LIPASE, AMYLASE in the last 168 hours. No results for input(s): AMMONIA in the last 168 hours. Coagulation Profile: Recent Labs  Lab 04/30/20 0852  INR 1.1   Cardiac Enzymes: No results for input(s): CKTOTAL, CKMB, CKMBINDEX, TROPONINI in the last 168 hours. BNP (last 3 results) No results for input(s): PROBNP in the last 8760 hours. HbA1C: No results for input(s): HGBA1C in the last 72 hours. CBG: No results for input(s): GLUCAP in the last 168 hours. Lipid Profile: No results for input(s): CHOL, HDL, LDLCALC, TRIG, CHOLHDL, LDLDIRECT in the last 72 hours. Thyroid Function Tests: No results for input(s): TSH, T4TOTAL, FREET4, T3FREE, THYROIDAB in the last 72 hours. Anemia Panel: No results for input(s): VITAMINB12, FOLATE, FERRITIN, TIBC, IRON,  RETICCTPCT in the last 72 hours. Sepsis Labs: Recent Labs  Lab 04/25/20 1118 04/25/20 1335  LATICACIDVEN 1.4 1.5    Recent Results (from the past 240 hour(s))  Acid Fast Smear (AFB)     Status: None   Collection Time: 04/21/20  4:50 PM   Specimen: CSF; Cerebrospinal Fluid  Result Value Ref Range Status   AFB Specimen Processing Concentration  Final   Acid Fast Smear Negative  Final    Comment: (NOTE) Performed At: [facility] [place], [place] [id number] Rush Farmer MD [id number]    Source (AFB) CSF  Final    Comment: Performed at [facility], [place] [address] [place], [place] [address]  Blood culture (routine x 2)     Status: None   Collection Time: 04/22/20  2:10 AM   Specimen: BLOOD RIGHT HAND  Result  Value Ref Range Status   Specimen Description BLOOD RIGHT HAND  Final   Special Requests   Final    BOTTLES DRAWN AEROBIC AND ANAEROBIC Blood Culture adequate volume   Culture   Final    NO GROWTH 5 DAYS Performed at [facility], [address] [place], [place] [address]    Report Status 04/27/2020 FINAL  Final  Blood culture (routine x 2)     Status: None   Collection Time: 04/22/20  2:20 AM   Specimen: BLOOD LEFT ARM  Result Value Ref Range Status   Specimen Description BLOOD LEFT ARM  Final   Special Requests   Final    BOTTLES DRAWN AEROBIC AND ANAEROBIC Blood Culture adequate volume   Culture   Final    NO GROWTH 5 DAYS Performed at [facility], [place] [address], [place], [place] [address]    Report Status 04/27/2020 FINAL  Final  CSF culture     Status: None   Collection Time: 04/25/20  4:50 PM   Specimen: CSF; Cerebrospinal Fluid  Result Value Ref Range Status   Specimen Description CSF  Final   Special Requests NONE  Final   Gram Stain   Final    ABUNDANT WBC PRESENT,BOTH PMN AND MONONUCLEAR NO ORGANISMS SEEN    Culture   Final    NO GROWTH Performed at [facility], [place] [address]  [place], [place] [address]    Report Status 04/29/2020 FINAL  Final  Culture, fungus without smear     Status: None (Preliminary result)   Collection Time: 04/25/20  4:50 PM   Specimen: CSF; Cerebrospinal Fluid  Result Value Ref Range Status   Specimen Description CSF  Final   Special Requests NONE  Final   Culture   Final    No Fungi Isolated in 4 Weeks Performed at [place] [address], [place], [place] [address]    Report Status PENDING  Incomplete  Anaerobic culture     Status: None (Preliminary result)   Collection Time: 04/25/20  8:00 PM   Specimen: CSF; Cerebrospinal Fluid  Result Value Ref Range Status   Specimen Description CSF TUBE 3  Final   Special Requests NONE  Final   Gram Stain   Final    ABUNDANT WBC PRESENT,BOTH PMN AND MONONUCLEAR NO ORGANISMS SEEN    Culture   Final    NO GROWTH 5 DAYS Performed at [facility], [address], [place], [place] [address]    Report Status PENDING  Incomplete  Culture, blood (routine x 2)     Status: None   Collection Time: 04/26/20 12:59 AM   Specimen: BLOOD  Result Value Ref Range Status   Specimen Description BLOOD RIGHT ARM  Final   Special Requests   Final    BOTTLES DRAWN AEROBIC AND ANAEROBIC Blood Culture adequate volume   Culture   Final    NO GROWTH 5 DAYS Performed at [facility], [address] [place], [place] [address]    Report Status 05/01/2020 FINAL  Final  Culture, blood (routine x 2)     Status: None   Collection Time: 04/26/20  1:04 AM   Specimen: BLOOD  Result Value Ref Range Status   Specimen Description BLOOD RIGHT HAND  Final   Special Requests   Final    BOTTLES DRAWN AEROBIC AND ANAEROBIC Blood Culture adequate volume   Culture   Final  NO GROWTH 5 DAYS Performed at [facility], [place] [address], [place],  [place] [address]    Report Status 05/01/2020 FINAL  Final  Acid Fast Smear (AFB)     Status: None   Collection Time: 04/30/20 10:13 AM   Specimen:  Bronchial Alveolar Lavage; Respiratory  Result Value Ref Range Status   AFB Specimen Processing Concentration  Final   Acid Fast Smear Negative  Final    Comment: (NOTE) Performed At: [facility] [place], [place] [id number] Rush Farmer MD OX:[id number]    Source (AFB) BRONCHIAL ALVEOLAR LAVAGE  Final    Comment: Performed at [facility], [place] [address], [place], [place] [address]  Aerobic/Anaerobic Culture (surgical/deep wound)     Status: None (Preliminary result)   Collection Time: 04/30/20 10:13 AM   Specimen: Bronchoalveolar Lavage  Result Value Ref Range Status   Specimen Description BRONCHIAL ALVEOLAR LAVAGE  Final   Special Requests RIGHT MIDDLE LOBE  Final   Gram Stain   Final    MODERATE WBC PRESENT,BOTH PMN AND MONONUCLEAR NO ORGANISMS SEEN    Culture   Final    CULTURE REINCUBATED FOR BETTER GROWTH Performed at [facility], [place] [address], [place], [place] [address]    Report Status PENDING  Incomplete         Radiology Studies: DG Chest 2 View  Result Date: 05/01/2020 CLINICAL DATA:  Mediastinal lymphadenopathy EXAM: CHEST - 2 VIEW COMPARISON:  Chest CT 04/27/2020 FINDINGS: Stable cardiac silhouette. Again demonstrated bilateral prominence of the hilar structures consistent with hilar lymphadenopathy. No interval change. No lung parenchymal abnormality identified. IMPRESSION: Persistent bilateral hilar lymphadenopathy. Common differential would include sarcoidosis versus lymphoma. Electronically Signed   By: Suzy Bouchard M.D.   On: 05/01/2020 11:00        Scheduled Meds:  dexamethasone  4 mg Oral Q8H   ethambutol  1,600 mg Oral Daily   heparin  5,000 Units Subcutaneous Q8H   isoniazid  300 mg Oral Daily   pyrazinamide  2,000 mg Oral Daily   vitamin B-6  50 mg Oral Daily   rifampin  600 mg Oral Daily   Continuous Infusions:  lactated ringers Stopped (04/30/20 1128)     LOS: 5 days   Time spent= 35  mins    Apoorva Bugay Arsenio Loader, MD [facility]  If 7PM-7AM, please contact night-coverage  05/01/2020, 11:04 AM

## 2020-05-01 NOTE — Op Note (Signed)
Video Bronchoscopy with Endobronchial Ultrasound Procedure Note  Date of Operation: 05/01/2020  Pre-op Diagnosis: Mediastinal adenopathy, unexplained meningitis  Post-op Diagnosis: same  Surgeon: Roselie Awkward  Assistants:   Anesthesia: general  Operation: Flexible video fiberoptic bronchoscopy with endobronchial ultrasound and biopsies.  Estimated Blood Loss:< 70VX  Complications: none immediate  Indications and History: Aaron Chapman is a 44 y.o. 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 He had COVID in August, history of multiple viral illnesses.  The risks, benefits, complications, treatment options and expected outcomes were discussed with the patient.  The possibilities of pneumothorax, pneumonia, reaction to medication, pulmonary aspiration, perforation of a viscus, bleeding, failure to diagnose a condition and creating a complication requiring transfusion or operation were discussed with the patient who freely signed the consent.    Description of Procedure: The patient was examined in the preoperative area and history and data from the preprocedure consultation were reviewed. It was deemed appropriate to proceed.  The patient was taken to [facility], identified as Aaron Chapman and the procedure verified as Flexible Video Fiberoptic Bronchoscopy.  A Time Out was held and the above information confirmed. After being taken to the operating room general anesthesia was initiated and the patient  was orally intubated. The video fiberoptic bronchoscope was introduced via the endotracheal tube and a general inspection was performed which showed normal airways throughout, no secretions, masses or lesions. The standard scope was then withdrawn and the endobronchial ultrasound was used to identify and characterize the peritracheal, hilar and bronchial lymph nodes. Inspection showed enlargement of  multiple lymph nodes in station 4R, 4L, 7. Using real-time ultrasound guidance Wang needle biopsies were take from Station 7 nodes and were sent for cytology. The patient tolerated the procedure well without apparent complications. There was no significant blood loss. The bronchoscope was withdrawn. Anesthesia was reversed and the patient was taken to the PACU for recovery.   Samples: 1. Wang needle biopsies from 7 node 2. BAL RML  Plans:  The patient will be discharged from the PACU to home when recovered from anesthesia. We will review the cytology, pathology and microbiology results with the patient when they become available. Outpatient followup will be with infectious disease.    Roselie Awkward, MD [facility] Pager: [phone] Cell: [phone] If no response, call [phone]

## 2020-05-02 DIAGNOSIS — I776 Arteritis, unspecified: Secondary | ICD-10-CM

## 2020-05-02 LAB — BASIC METABOLIC PANEL
Anion gap: 9 (ref 5–15)
BUN: 7 mg/dL (ref 6–20)
CO2: 23 mmol/L (ref 22–32)
Calcium: 8.7 mg/dL — ABNORMAL LOW (ref 8.9–10.3)
Chloride: 101 mmol/L (ref 98–111)
Creatinine, Ser: 0.78 mg/dL (ref 0.61–1.24)
GFR calc Af Amer: >60 mL/min (ref >60)
GFR calc non Af Amer: >60 mL/min (ref >60)
Glucose, Bld: 108 mg/dL — ABNORMAL HIGH (ref 70–99)
Potassium: 4.1 mmol/L (ref 3.5–5.1)
Sodium: 133 mmol/L — ABNORMAL LOW (ref 135–145)

## 2020-05-02 LAB — CBC
HCT: 37.4 % — ABNORMAL LOW (ref 39.0–52.0)
Hemoglobin: 12.6 g/dL — ABNORMAL LOW (ref 13.0–17.0)
MCH: 30.3 pg (ref 26.0–34.0)
MCHC: 33.7 g/dL (ref 30.0–36.0)
MCV: 89.9 fL (ref 80.0–100.0)
Platelets: 212 10*3/uL (ref 150–400)
RBC: 4.16 MIL/uL — ABNORMAL LOW (ref 4.22–5.81)
RDW: 12.7 % (ref 11.5–15.5)
WBC: 5.3 10*3/uL (ref 4.0–10.5)
nRBC: 0 % (ref 0.0–0.2)

## 2020-05-02 LAB — ANAEROBIC CULTURE

## 2020-05-02 LAB — MAGNESIUM: Magnesium: 2 mg/dL (ref 1.7–2.4)

## 2020-05-02 MED ORDER — DEXAMETHASONE SODIUM PHOSPHATE 10 MG/ML IJ SOLN
8.0000 mg | Freq: Four times a day (QID) | INTRAMUSCULAR | Status: DC
  Administered 2020-05-02 – 2020-05-04 (×8): 8 mg via INTRAVENOUS
  Filled 2020-05-02 (×10): qty 0.8

## 2020-05-02 MED ORDER — OXYCODONE HCL 5 MG PO TABS
10.0000 mg | ORAL_TABLET | ORAL | Status: DC | PRN
  Administered 2020-05-02 – 2020-05-04 (×5): 10 mg via ORAL
  Filled 2020-05-02 (×5): qty 2

## 2020-05-02 MED ORDER — SENNOSIDES-DOCUSATE SODIUM 8.6-50 MG PO TABS
2.0000 | ORAL_TABLET | Freq: Every day | ORAL | Status: DC
  Administered 2020-05-02 – 2020-05-04 (×3): 2 via ORAL
  Filled 2020-05-02 (×3): qty 2

## 2020-05-02 NOTE — Plan of Care (Signed)
  Problem: Coping: Goal: Level of anxiety will decrease Outcome: Progressing   Problem: Pain Managment: Goal: General experience of comfort will improve Outcome: Progressing   

## 2020-05-02 NOTE — Plan of Care (Signed)
  Problem: Pain Managment: Goal: General experience of comfort will improve Outcome: Progressing   

## 2020-05-02 NOTE — Progress Notes (Signed)
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  Outpatient he had received a course of steroids and completed course of Augmentin.  Due to persistent fever had CT chest which showed right lower lobe nodule, left lower lobe nodule and bilateral hilar lymphadenopathy.  Some focal sclerosing lesion in the L1 vertebral body.  Later also developed truncal and bilateral upper and lower extremity rash.  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  ID consulted and started on steroids and RIPE therapy.  Pulmonary team was consulted and underwent bronchoscopy/EBUS on 9/30.  Thereafter spiked fever overnight.  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  Case discussed with neuro interventional and neurology who recommended cerebral angiogram.   Assessment & Plan:   Active Problems:   Fever and chills   Bilateral headaches   Meningitis  Recurrent fevers with headaches.   Bilateral hilar lymphadenopathy/mediastinal lymphadenopathy Bilateral pulmonary nodules -RIPE therapy has not been discontinued due to negative QuantiFERON/AFB studies.  There is also concerns of lymphoma/sarcoidosis along with acute vasculitis. -Increase steroids, pain control and bowel regimen -CSF studies-for AFB-negative -HIV-negative -QuantiFERON test-pending.  CSF AFB smears -CT chest without contrast 9/27-multifocal mediastinal/hilar adenopathy, calcification of right hilar nodes, granulomatous disease?  3-4 mm stable nodule.  -Pulmonary team consulted.  Status post bronc/EBUS.  Biopsy pending -Repeat MRI brain shows mycotic aneurysm.  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  No infectious etiology -Echocardiogram-overall unremarkable.  No obvious signs of infection -Case discussed with on-call neurology, Dr. Theda Sers and Dr. Estanislado Pandy from neuro interventional-tentative plans for cerebral angiogram on Monday.   DVT prophylaxis: heparin injection 5,000 Units Start: 04/26/20 1400 Code Status: Full code Family Communication: Wife at bedside  Status is: Inpatient  Remains inpatient appropriate because:Inpatient level of care appropriate due to severity of illness  Dispo: The patient is from: Home              Anticipated d/c is to: Home              Anticipated d/c date is: > 3 days              Patient currently is not medically stable to d/c.  Ongoing treatment for TB meningitis.  Maintain hospital stay until cleared by infectious disease.   Body mass index is 23.04 kg/m.  Subjective: Translator line used ID [id number]  Patient is still having some photophobia and headache.  Denies any other complaints.  Wife also present during our interaction  Review of Systems Otherwise negative except as per HPI, including: General: Denies fever, chills, night sweats or unintended weight loss. Resp: Denies cough, wheezing, shortness of breath. Cardiac: Denies chest pain, palpitations, orthopnea, paroxysmal nocturnal dyspnea. GI: Denies abdominal pain, nausea, vomiting, diarrhea or constipation GU: Denies dysuria, frequency, hesitancy or incontinence MS: Denies muscle aches, joint pain or swelling Neuro: Denies neurologic deficits (focal weakness, numbness, tingling), abnormal gait Psych: Denies anxiety, depression, SI/HI/AVH Skin: Denies new rashes or lesions ID: Denies sick contacts, exotic exposures, travel  Examination:  Constitutional: Not in acute distress Respiratory: Clear to auscultation bilaterally Cardiovascular: Normal sinus rhythm, no rubs Abdomen:  Nontender nondistended good bowel  sounds Musculoskeletal: No edema noted Skin: No rashes seen Neurologic: CN 2-12 grossly intact.  And nonfocal Psychiatric: Normal judgment and insight. Alert and oriented x 3. Normal mood.  Objective: Vitals:   05/01/20 1646 05/01/20 1845 05/01/20 2116 05/02/20 0437  BP: 127/81 125/84 127/87 121/87  Pulse: (!) 127 (!) 104 (!) 109 98  Resp: _0 Temp: (!) 100.7 F (38.2 C) 98.2 F (36.8 C) 99.7 F (37.6 C) 97.6 F (36.4 C)  TempSrc: Oral Oral Oral Oral  SpO2: 98% 98% 99% 95%  Weight:   79.2 kg   Height:        Intake/Output Summary (Last 24 hours) at 05/02/2020 1110 Last data filed at 05/02/2020 1000 Gross per 24 hour  Intake 2197.72 ml  Output 1877 ml  Net 320.72 ml   Filed Weights   04/30/20 0910 04/30/20 2013 05/01/20 2116  Weight: 75 kg 78.7 kg 79.2 kg     Data Reviewed:   CBC: Recent Labs  Lab 04/25/20 1118 04/26/20 1620 05/01/20 0806 05/02/20 0509  WBC 4.5 7.5 5.2 5.3  NEUTROABS 2.9  --   --   --   HGB 15.1 13.8 13.9 12.6*  HCT 44.8 41.2 40.7 37.4*  MCV 93.1 91.4 90.2 89.9  PLT 200 210 244 330   Basic Metabolic Panel: Recent Labs  Lab 04/27/20 0438 04/27/20 0438 04/28/20 0736 04/29/20 1122 04/30/20 0852 05/01/20 0806 05/02/20 0509  NA 135  --  134* 132*  --  132* 133*  K 4.7  --  4.6 4.5  --  4.8 4.1  CL 103  --  101 96*  --  97* 101  CO2 24  --  26 27  --  25 23  GLUCOSE 102*  --  98 110*  --  105* 108*  BUN 12  --  11 9  --  9 7  CREATININE 0.68  --  0.90 0.91  --  0.77 0.78  CALCIUM 9.1  --  9.1 9.5  --  8.8* 8.7*  MG 1.9   < > 1.8 2.0 2.0 2.0 2.0   < > = values in this interval not displayed.   GFR: Estimated Creatinine Clearance: 129.3 mL/min (by C-G formula based on SCr of 0.78 mg/dL). Liver Function Tests: Recent Labs  Lab 04/25/20 1118 04/25/20 2000 04/26/20 1620 04/27/20 0438 04/28/20 0736 04/29/20 1122  AST 18  --  15 14* 12* 13*  ALT 28  --  _1 ALKPHOS 35*  --  38 35* 39 36*  BILITOT 0.9  --  1.7*  1.0 1.0 1.0  PROT 7.0  --  6.4* 6.6 6.6 6.8  ALBUMIN 3.8 3.9* 3.4* 3.5 3.5 3.6   No results for input(s): LIPASE, AMYLASE in the last 168 hours. No results for input(s): AMMONIA in the last 168 hours. Coagulation Profile: Recent Labs  Lab 04/30/20 0852  INR 1.1   Cardiac Enzymes: No results for input(s): CKTOTAL, CKMB, CKMBINDEX, TROPONINI in the last 168 hours. BNP (last 3 results) No results for input(s): PROBNP in the last 8760 hours. HbA1C: No results for input(s): HGBA1C in the last 72 hours. CBG: No results for input(s): GLUCAP in the last 168 hours. Lipid Profile: No results for input(s): CHOL, HDL, LDLCALC, TRIG, CHOLHDL, LDLDIRECT in the last 72 hours. Thyroid Function Tests: No results for input(s): TSH, T4TOTAL, FREET4, T3FREE, THYROIDAB in the last 72 hours. Anemia Panel: No results for input(s):  VITAMINB12, FOLATE, FERRITIN, TIBC, IRON, RETICCTPCT in the last 72 hours. Sepsis Labs: Recent Labs  Lab 04/25/20 1118 04/25/20 1335  LATICACIDVEN 1.4 1.5    Recent Results (from the past 240 hour(s))  CSF culture     Status: None   Collection Time: 04/25/20  4:50 PM   Specimen: CSF; Cerebrospinal Fluid  Result Value Ref Range Status   Specimen Description CSF  Final   Special Requests NONE  Final   Gram Stain   Final    ABUNDANT WBC PRESENT,BOTH PMN AND MONONUCLEAR NO ORGANISMS SEEN    Culture   Final    NO GROWTH Performed at [facility], [address], [place], [place] [address]    Report Status 04/29/2020 FINAL  Final  Culture, fungus without smear     Status: None (Preliminary result)   Collection Time: 04/25/20  4:50 PM   Specimen: CSF; Cerebrospinal Fluid  Result Value Ref Range Status   Specimen Description CSF  Final   Special Requests NONE  Final   Culture   Final    NO FUNGUS ISOLATED AFTER 5 DAYS Performed at [facility], [place] [address] [place], [place] [address]    Report Status PENDING  Incomplete  Anaerobic culture      Status: None (Preliminary result)   Collection Time: 04/25/20  8:00 PM   Specimen: CSF; Cerebrospinal Fluid  Result Value Ref Range Status   Specimen Description CSF TUBE 3  Final   Special Requests NONE  Final   Gram Stain   Final    ABUNDANT WBC PRESENT,BOTH PMN AND MONONUCLEAR NO ORGANISMS SEEN    Culture   Final    NO GROWTH 5 DAYS Performed at [facility], [address], [place], [place] [address]    Report Status PENDING  Incomplete  Culture, blood (routine x 2)     Status: None   Collection Time: 04/26/20 12:59 AM   Specimen: BLOOD  Result Value Ref Range Status   Specimen Description BLOOD RIGHT ARM  Final   Special Requests   Final    BOTTLES DRAWN AEROBIC AND ANAEROBIC Blood Culture adequate volume   Culture   Final    NO GROWTH 5 DAYS Performed at [facility], [address], [place], [place] [address]    Report Status 05/01/2020 FINAL  Final  Culture, blood (routine x 2)     Status: None   Collection Time: 04/26/20  1:04 AM   Specimen: BLOOD  Result Value Ref Range Status   Specimen Description BLOOD RIGHT HAND  Final   Special Requests   Final    BOTTLES DRAWN AEROBIC AND ANAEROBIC Blood Culture adequate volume   Culture   Final    NO GROWTH 5 DAYS Performed at [facility], [place] [address], [place], [place] [address]    Report Status 05/01/2020 FINAL  Final  Acid Fast Smear (AFB)     Status: None   Collection Time: 04/30/20 10:13 AM   Specimen: Bronchial Alveolar Lavage; Respiratory  Result Value Ref Range Status   AFB Specimen Processing Concentration  Final   Acid Fast Smear Negative  Final    Comment: (NOTE) Performed At: [facility] [place], [place] [id number] Rush Farmer MD OF:[id number]    Source (AFB) BRONCHIAL ALVEOLAR LAVAGE  Final    Comment: Performed at [facility], [place] [address], [place], [place] [address]  Aerobic/Anaerobic Culture (surgical/deep wound)  Status: None (Preliminary  result)   Collection Time: 04/30/20 10:13 AM   Specimen: Bronchoalveolar Lavage  Result Value Ref Range Status   Specimen Description BRONCHIAL ALVEOLAR LAVAGE  Final   Special Requests RIGHT MIDDLE LOBE  Final   Gram Stain   Final    MODERATE WBC PRESENT,BOTH PMN AND MONONUCLEAR NO ORGANISMS SEEN    Culture   Final    CULTURE REINCUBATED FOR BETTER GROWTH Performed at [facility], [place] [address], [place], [place] [address]    Report Status PENDING  Incomplete         Radiology Studies: CT ANGIO HEAD W OR WO CONTRAST  Result Date: 05/01/2020 CLINICAL DATA:  44 year old male with abnormal MRIs recently suspicious for basilar meningitis, cerebritis. Ectatic left MCA M2 posterior branch, query mycotic aneurysm. Status post COVID-19 1 month ago. Status post bronchoscopy with bronchoscopic biopsy of mediastinal lymphadenopathy yesterday. Pathology results pending. EXAM: CT ANGIOGRAPHY HEAD AND NECK TECHNIQUE: Multidetector CT imaging of the head and neck was performed using the standard protocol during bolus administration of intravenous contrast. Multiplanar CT image reconstructions and MIPs were obtained to evaluate the vascular anatomy. Carotid stenosis measurements (when applicable) are obtained utilizing NASCET criteria, using the distal internal carotid diameter as the denominator. CONTRAST:  82m OMNIPAQUE IOHEXOL 350 MG/ML SOLN COMPARISON:  Brain MRI earlier today. Brain MRI and intracranial MRA 04/25/2020. chest CT 04/10/2020. FINDINGS: CT HEAD Brain: No intracranial hemorrhage is identified. Largely unremarkable noncontrast CT appearance of the basilar cisterns. No posterior fossa encephalomalacia identified on the right although overlying previous right suboccipital craniectomy (series 5, image 9). Conspicuous vascular calcification at the left MCA trifurcation (series 5, image 15 and series 8, image 25), although no other intracranial calcified plaque is evident. No  ventriculomegaly. No cortically based acute infarct identified. Gray-white matter differentiation within normal limits. Calvarium and skull base: Previous right suboccipital craniectomy. No acute osseous abnormality identified. Paranasal sinuses: Visualized paranasal sinuses and mastoids are stable and well pneumatized. Orbits: No acute orbit or scalp soft tissue finding. CTA NECK Skeleton: Carious posterior dentition on the left. Benign-appearing C6 vertebral body hemangioma. No acute or suspicious osseous lesion identified. Upper chest: Mediastinal and hilar lymphadenopathy redemonstrated. Visible lungs are clear. Other neck: Negative.  No cervical lymphadenopathy. Aortic arch: 3 vessel arch configuration.  No arch atherosclerosis. Right carotid system: Negative. Left carotid system: Negative. Vertebral arteries: Negative.  The left vertebral artery is mildly dominant. CTA HEAD Posterior circulation: Distal vertebral arteries are patent, with mild irregularity of the distal right V4 segment but no distal vertebral stenosis. Normal vertebrobasilar junction. Both PICA origins are patent and within normal limits. Patent basilar artery with mild tortuosity. No basilar stenosis. SCA and PCA origins are normal. Small posterior communicating arteries. Left PCA branches are within normal limits. Right PCA branches are mildly irregular (series 14, image 21). 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 Anterior circulation: Both ICA siphons are patent with no plaque or stenosis identified. Normal ophthalmic and posterior communicating artery origins. Patent carotid termini. Patent MCA and ACA origins. Anterior communicating artery and bilateral ACA branches are within normal limits. Left MCA M1 segment is within normal limits. The left MCA trifurcation is ectatic (series 13 images 149 and 150) and there is a small calcification associated along the superior distal M1  (image 148). The vessel enlargement is more fusiform and saccular, although there does appear to be a subtle saccular component directed anteriorly and superiorly on series  13, image 151. The origin of all M2 branches are incorporated. Elsewhere the dominant posterior M2 to M3 branching also appears irregular on series 14, image 32. And other left MCA branches are also irregular on series 14, image 13. 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 Right MCA bifurcation is patent without stenosis. Right MCA branches appear more normal than those on the left side (series 14, image 16). Venous sinuses: Patent. The vein of Galen and the inferior sagittal sinus do appear mildly irregular. But there is no venous sinus filling defect identified. Anatomic variants: Mildly dominant left vertebral artery. Review of the MIP images confirms the above findings IMPRESSION: 1. Positive for intracranial vessel irregularity, most pronounced at the the left MCA and right PCA branches, suspicious for Acute Vasculitis. 2. 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 A conspicuous vessel calcification is noted there, with no superimposed atherosclerosis elsewhere in the head and neck. 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 3. 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 No new abnormality of the brain parenchyma identified by CT. The patient has previously had a right suboccipital craniotomy of unclear significance. 4. Mediastinal and hilar lymphadenopathy again noted, recently biopsied by bronchoscopy. Electronically Signed   By: Genevie Ann M.D.   On: 05/01/2020 22:48   DG Chest 2 View  Result Date: 05/01/2020 CLINICAL DATA:  Mediastinal lymphadenopathy EXAM: CHEST - 2 VIEW COMPARISON:  Chest CT 04/27/2020 FINDINGS: Stable cardiac silhouette. Again  demonstrated bilateral prominence of the hilar structures consistent with hilar lymphadenopathy. No interval change. No lung parenchymal abnormality identified. IMPRESSION: Persistent bilateral hilar lymphadenopathy. Common differential would include sarcoidosis versus lymphoma. Electronically Signed   By: Suzy Bouchard M.D.   On: 05/01/2020 11:00   MR BRAIN W WO CONTRAST  Result Date: 05/01/2020 CLINICAL DATA:  Brain mass or lesion; follow up. EXAM: MRI HEAD WITHOUT AND WITH CONTRAST TECHNIQUE: Multiplanar, multiecho pulse sequences of the brain and surrounding structures were obtained without and with intravenous contrast. CONTRAST:  7.12m GADAVIST GADOBUTROL 1 MMOL/ML IV SOLN COMPARISON:  MRI of the brain April 25 2020 FINDINGS: Brain: No acute infarction, hemorrhage, hydrocephalus or mass lesion. No significant interval change of the leptomeningeal thickening and contrast enhancement along the bilateral cerebellar sulci, brainstem surface predominantly on the right side and more pronounced on the right, extending into the right internal auditory canal. Small collection in the right cerebellopontine angle cistern, measuring approximately 1.3 x 0.5 mm, unchanged from prior. 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 Vascular: Flow voids are preserved. Ectatic left MCA/M2 posterior division branch (series 6, images 12 and 13). Given ongoing infectious process, evaluation with CT angiogram is suggested to exclude mycotic aneurysm. Skull and upper cervical spine: Normal marrow signal. Sinuses/Orbits: Negative. Other: Small left mastoid effusion. IMPRESSION: 1. No significant interval change of the basal leptomeningeal process with small collection in the right cerebellopontine angle and associated right middle cerebellar peduncle cerebritis. 2. Ectatic  left MCA/M2 posterior division branch. Given ongoing infectious process, evaluation with CT angiogram is suggested to exclude mycotic aneurysm. These results will be called to the ordering clinician or representative by the Radiologist Assistant, and communication documented in the PACS or [employer] Electronically Signed   By: KSandre KittyD.  On: 05/01/2020 11:07   ECHOCARDIOGRAM COMPLETE  Result Date: 05/01/2020    ECHOCARDIOGRAM REPORT   Patient Name:   DALLAS TOROK Date of Exam: 05/01/2020 Medical Rec #:  [id number]      Height:       73.0 in Accession #:    [id number]     Weight:       173.5 lb Date of Birth:  12/03/1975      BSA:          2.026 m Patient Age:    56 years       BP:           119/76 mmHg Patient Gender: M              HR:           105 bpm. Exam Location:  Inpatient Procedure: 2D Echo Indications:    Endocarditis I38  History:        Patient has no prior history of Echocardiogram examinations.  Sonographer:    Mikki Santee RDCS (AE) Referring Phys: [id number] Ashleyann Shoun CHIRAG Tyrihanna Wingert IMPRESSIONS  1. Left ventricular ejection fraction, by estimation, is 50 to 55%. The left ventricle has low normal function. The left ventricle has no regional wall motion abnormalities. Left ventricular diastolic parameters were normal.  2. Right ventricular systolic function is normal. The right ventricular size is normal.  3. The mitral valve is normal in structure. Trivial mitral valve regurgitation. No evidence of mitral stenosis.  4. The aortic valve has an indeterminant number of cusps. Aortic valve regurgitation is not visualized. No aortic stenosis is present.  5. The inferior vena cava is normal in size with greater than 50% respiratory variability, suggesting right atrial pressure of 3 mmHg. FINDINGS  Left Ventricle: Left ventricular ejection fraction, by estimation, is 50 to 55%. The left ventricle has low normal function. The left ventricle has no regional wall motion  abnormalities. The left ventricular internal cavity size was normal in size. There is no left ventricular hypertrophy. Left ventricular diastolic parameters were normal. Right Ventricle: The right ventricular size is normal. Right ventricular systolic function is normal. Left Atrium: Left atrial size was normal in size. Right Atrium: Right atrial size was normal in size. Pericardium: There is no evidence of pericardial effusion. Mitral Valve: The mitral valve is normal in structure. Trivial mitral valve regurgitation. No evidence of mitral valve stenosis. Tricuspid Valve: The tricuspid valve is normal in structure. Tricuspid valve regurgitation is trivial. No evidence of tricuspid stenosis. Aortic Valve: The aortic valve has an indeterminant number of cusps. Aortic valve regurgitation is not visualized. No aortic stenosis is present. Pulmonic Valve: The pulmonic valve was not well visualized. Pulmonic valve regurgitation is not visualized. No evidence of pulmonic stenosis. Aorta: The aortic root is normal in size and structure. Venous: The inferior vena cava is normal in size with greater than 50% respiratory variability, suggesting right atrial pressure of 3 mmHg. IAS/Shunts: No atrial level shunt detected by color flow Doppler.  LEFT VENTRICLE PLAX 2D LVIDd:         3.80 cm  Diastology LVIDs:         2.95 cm  LV e' medial:  8.38 cm/s LV PW:         1.10 cm  LV e' lateral: 8.59 cm/s LV IVS:        1.10 cm LVOT diam:     2.30 cm LV SV:         51 LV  SV Index:   25 LVOT Area:     4.15 cm  RIGHT VENTRICLE RV S prime:     12.60 cm/s LEFT ATRIUM             Index       RIGHT ATRIUM          Index LA diam:        2.80 cm 1.38 cm/m  RA Area:     8.59 cm LA Vol (A2C):   40.3 ml 19.90 ml/m RA Volume:   14.50 ml 7.16 ml/m LA Vol (A4C):   36.3 ml 17.92 ml/m LA Biplane Vol: 38.9 ml 19.21 ml/m  AORTIC VALVE LVOT Vmax:   87.20 cm/s LVOT Vmean:  56.300 cm/s LVOT VTI:    0.122 m  AORTA Ao Root diam: 3.20 cm  SHUNTS Systemic  VTI:  0.12 m Systemic Diam: 2.30 cm Kirk Ruths MD Electronically signed by Kirk Ruths MD Signature Date/Time: 05/01/2020/2:11:41 PM    Final    CT ANGIO NECK CODE STROKE  Result Date: 05/01/2020 CLINICAL DATA:  44 year old male with abnormal MRIs recently suspicious for basilar meningitis, cerebritis. Ectatic left MCA M2 posterior branch, query mycotic aneurysm. Status post COVID-19 1 month ago. Status post bronchoscopy with bronchoscopic biopsy of mediastinal lymphadenopathy yesterday. Pathology results pending. EXAM: CT ANGIOGRAPHY HEAD AND NECK TECHNIQUE: Multidetector CT imaging of the head and neck was performed using the standard protocol during bolus administration of intravenous contrast. Multiplanar CT image reconstructions and MIPs were obtained to evaluate the vascular anatomy. Carotid stenosis measurements (when applicable) are obtained utilizing NASCET criteria, using the distal internal carotid diameter as the denominator. CONTRAST:  72m OMNIPAQUE IOHEXOL 350 MG/ML SOLN COMPARISON:  Brain MRI earlier today. Brain MRI and intracranial MRA 04/25/2020. chest CT 04/10/2020. FINDINGS: CT HEAD Brain: No intracranial hemorrhage is identified. Largely unremarkable noncontrast CT appearance of the basilar cisterns. No posterior fossa encephalomalacia identified on the right although overlying previous right suboccipital craniectomy (series 5, image 9). Conspicuous vascular calcification at the left MCA trifurcation (series 5, image 15 and series 8, image 25), although no other intracranial calcified plaque is evident. No ventriculomegaly. No cortically based acute infarct identified. Gray-white matter differentiation within normal limits. Calvarium and skull base: Previous right suboccipital craniectomy. No acute osseous abnormality identified. Paranasal sinuses: Visualized paranasal sinuses and mastoids are stable and well pneumatized. Orbits: No acute orbit or scalp soft tissue finding. CTA NECK  Skeleton: Carious posterior dentition on the left. Benign-appearing C6 vertebral body hemangioma. No acute or suspicious osseous lesion identified. Upper chest: Mediastinal and hilar lymphadenopathy redemonstrated. Visible lungs are clear. Other neck: Negative.  No cervical lymphadenopathy. Aortic arch: 3 vessel arch configuration.  No arch atherosclerosis. Right carotid system: Negative. Left carotid system: Negative. Vertebral arteries: Negative.  The left vertebral artery is mildly dominant. CTA HEAD Posterior circulation: Distal vertebral arteries are patent, with mild irregularity of the distal right V4 segment but no distal vertebral stenosis. Normal vertebrobasilar junction. Both PICA origins are patent and within normal limits. Patent basilar artery with mild tortuosity. No basilar stenosis. SCA and PCA origins are normal. Small posterior communicating arteries. Left PCA branches are within normal limits. Right PCA branches are mildly irregular (series 14, image 21). 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 Anterior circulation: Both ICA siphons are patent with no plaque or stenosis identified. Normal ophthalmic and posterior communicating artery origins. Patent carotid termini. Patent MCA and ACA origins. Anterior communicating artery and bilateral  ACA branches are within normal limits. Left MCA M1 segment is within normal limits. 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 The vessel enlargement is more fusiform and saccular, although there does appear to be a subtle saccular component directed anteriorly and superiorly on series 13, image 151. The origin of all M2 branches are incorporated. Elsewhere the dominant posterior M2 to M3 branching also appears irregular on series 14, image 32. And other left MCA branches are also irregular on  series 14, image 13. 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 Right MCA bifurcation is patent without stenosis. Right MCA branches appear more normal than those on the left side (series 14, image 16). Venous sinuses: Patent. The vein of Galen and the inferior sagittal sinus do appear mildly irregular. But there is no venous sinus filling defect identified. Anatomic variants: Mildly dominant left vertebral artery. Review of the MIP images confirms the above findings IMPRESSION: 1. Positive for intracranial vessel irregularity, most pronounced at the the left MCA and right PCA branches, suspicious for Acute Vasculitis. 2. 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 A conspicuous vessel calcification is noted there, with no superimposed atherosclerosis elsewhere in the head and neck. 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 3. 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 No new abnormality of the brain parenchyma identified by CT. The patient has previously had a right suboccipital craniotomy of unclear significance. 4. Mediastinal and hilar lymphadenopathy again noted, recently biopsied by bronchoscopy. Electronically Signed   By: Genevie Ann M.D.   On: 05/01/2020 22:48         Scheduled Meds:  dexamethasone  4 mg Oral Q8H   heparin  5,000 Units Subcutaneous Q8H   Continuous Infusions:  sodium chloride 125 mL/hr at 05/02/20 1000   lactated ringers Stopped (04/30/20 1128)     LOS: 6 days   Time spent= 35 mins    Delaney Perona Arsenio Loader, MD [facility]  If 7PM-7AM, please contact night-coverage  05/02/2020, 11:10 AM

## 2020-05-02 NOTE — Consult Note (Signed)
Chief Complaint: Patient was seen in consultation today for diagnostic cerebral angiogram  Referring Physician(s): Dr. Nelson Chimes  Supervising Physician: Julieanne Cotton  Patient Status: [facility] - In-pt  History of Present Illness: Aaron Chapman is a 43 y.o. 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 He underwent a lumbar puncture which was concerning for non-bacterial meningitis and was admitted for further evaluation. He was seen by neurology, infectious disease and critical care. He underwent an bronchoscopy, MRI, MRA and CTA head/neck as part of this workup. 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  Patient and wife sleeping upon entry to room but arouse easily, he complains of headache but otherwise feels ok. We discussed the procedure today using two different translators - initially Deatra Ina [phone] and then after being disconnected Sirleny [phone]. Patient and his wife are agreeable to proceed.  Past Medical History:  Diagnosis Date  . COVID-19    positive 03/14/20 - tested at a clinic in [place] - unsure of name of clinic  . Dyspnea   . History of kidney stones   . Lung nodules   . Pneumonia     Past Surgical History:  Procedure Laterality Date  . BRAIN SURGERY    . KIDNEY STONE SURGERY     removal  . TONSILLECTOMY    . WISDOM TOOTH EXTRACTION      Allergies: Aspirin  Medications: Prior to Admission medications   Medication Sig Start Date End Date Taking? Authorizing Provider  Ascorbic Acid (VITAMIN C) 1000 MG tablet Take 1,000 mg by mouth daily.   Yes [provider]  ibuprofen (ADVIL) 800 MG tablet Take 800 mg by mouth every 8 (eight) hours  as needed for fever.   Yes [provider]  promethazine-dextromethorphan (PROMETHAZINE-DM) 6.25-15 MG/5ML syrup Take 5 mLs by mouth 4 (four) times daily as needed for cough. Patient taking differently: Take 5 mLs by mouth 2 (two) times daily as needed for cough.  04/22/20  Yes McDonald, Mia A, PA-C  VITAMIN A PO Take 1 capsule by mouth daily.   Yes [provider]  amoxicillin-clavulanate (AUGMENTIN) 875-125 MG tablet Take 1 tablet by mouth 2 (two) times daily.    [provider]  dexamethasone (DECADRON) 4 MG tablet Take 4 mg by mouth daily.    [provider]     History reviewed. No pertinent family history.  Social History   Socioeconomic History  . Marital status: Married    Spouse name: Not on file  . Number of children: Not on file  . Years of education: Not on file  . Highest education level: Not on file  Occupational History  . Not on file  Tobacco Use  . Smoking status: Never Smoker  . Smokeless tobacco: Never Used  Vaping Use  . Vaping Use: Never assessed  Substance and Sexual Activity  . Alcohol use: Yes    Comment: occasional   . Drug use: Not Currently  . Sexual activity: Not on file  Other Topics Concern  . Not on file  Social History Narrative  . Not on file   Social Determinants of Health   Financial Resource Strain:   . Difficulty of Paying Living Expenses: Not on file  Food  Insecurity:   . Worried About Programme researcher, broadcasting/film/videounning Out of Food in the Last Year: Not on file  . Ran Out of Food in the Last Year: Not on file  Transportation Needs:   . Lack of Transportation (Medical): Not on file  . Lack of Transportation (Non-Medical): Not on file  Physical Activity:   . Days of Exercise per Week: Not on file  . Minutes of Exercise per Session: Not on file  Stress:   . Feeling of Stress : Not on file  Social Connections:   . Frequency of Communication with Friends and Family: Not on file  . Frequency of Social Gatherings with Friends  and Family: Not on file  . Attends Religious Services: Not on file  . Active Member of Clubs or Organizations: Not on file  . Attends BankerClub or Organization Meetings: Not on file  . Marital Status: Not on file     Review of Systems: A 12 point ROS discussed and pertinent positives are indicated in the HPI above.  All other systems are negative.  Review of Systems  Constitutional: Positive for fever (comes and goes). Negative for appetite change and chills.  Respiratory: Negative for cough and shortness of breath.   Cardiovascular: Negative for chest pain.  Gastrointestinal: Negative for abdominal pain, diarrhea, nausea and vomiting.  Musculoskeletal: Negative for back pain.  Neurological: Positive for headaches. Negative for dizziness, tremors, facial asymmetry, speech difficulty, weakness, light-headedness and numbness.    Vital Signs: BP 118/80   Pulse 92   Temp 98 F (36.7 C) (Oral)   Resp 16   Ht 6\' 1"  (1.854 m)   Wt 174 lb 9.7 oz (79.2 kg)   SpO2 98%   BMI 23.04 kg/m   Physical Exam Vitals and nursing note reviewed.  Constitutional:      General: He is not in acute distress. HENT:     Head: Normocephalic.     Mouth/Throat:     Mouth: Mucous membranes are moist.     Pharynx: Oropharynx is clear. No oropharyngeal exudate or posterior oropharyngeal erythema.  Cardiovascular:     Rate and Rhythm: Normal rate and regular rhythm.  Pulmonary:     Effort: Pulmonary effort is normal.     Breath sounds: Normal breath sounds.  Abdominal:     General: There is no distension.     Palpations: Abdomen is soft.     Tenderness: There is no abdominal tenderness.  Skin:    General: Skin is warm and dry.  Neurological:     Mental Status: He is alert and oriented to person, place, and time.  Psychiatric:        Mood and Affect: Mood normal.        Behavior: Behavior normal.        Thought Content: Thought content normal.        Judgment: Judgment normal.      MD  Evaluation Airway: WNL Heart: WNL Abdomen: WNL Chest/ Lungs: WNL ASA  Classification: 2 Mallampati/Airway Score: One   Imaging: CT ANGIO HEAD W OR WO CONTRAST  Result Date: 05/01/2020 CLINICAL DATA:  44 year old male with abnormal MRIs recently suspicious for basilar meningitis, cerebritis. Ectatic left MCA M2 posterior branch, query mycotic aneurysm. Status post COVID-19 1 month ago. Status post bronchoscopy with bronchoscopic biopsy of mediastinal lymphadenopathy yesterday. Pathology results pending. EXAM: CT ANGIOGRAPHY HEAD AND NECK TECHNIQUE: Multidetector CT imaging of the head and neck was performed using the standard protocol during bolus administration of intravenous contrast.  Multiplanar CT image reconstructions and MIPs were obtained to evaluate the vascular anatomy. Carotid stenosis measurements (when applicable) are obtained utilizing NASCET criteria, using the distal internal carotid diameter as the denominator. CONTRAST:  75mL OMNIPAQUE IOHEXOL 350 MG/ML SOLN COMPARISON:  Brain MRI earlier today. Brain MRI and intracranial MRA 04/25/2020. chest CT 04/10/2020. FINDINGS: CT HEAD Brain: No intracranial hemorrhage is identified. Largely unremarkable noncontrast CT appearance of the basilar cisterns. No posterior fossa encephalomalacia identified on the right although overlying previous right suboccipital craniectomy (series 5, image 9). Conspicuous vascular calcification at the left MCA trifurcation (series 5, image 15 and series 8, image 25), although no other intracranial calcified plaque is evident. No ventriculomegaly. No cortically based acute infarct identified. Gray-white matter differentiation within normal limits. Calvarium and skull base: Previous right suboccipital craniectomy. No acute osseous abnormality identified. Paranasal sinuses: Visualized paranasal sinuses and mastoids are stable and well pneumatized. Orbits: No acute orbit or scalp soft tissue finding. CTA NECK Skeleton:  Carious posterior dentition on the left. Benign-appearing C6 vertebral body hemangioma. No acute or suspicious osseous lesion identified. Upper chest: Mediastinal and hilar lymphadenopathy redemonstrated. Visible lungs are clear. Other neck: Negative.  No cervical lymphadenopathy. Aortic arch: 3 vessel arch configuration.  No arch atherosclerosis. Right carotid system: Negative. Left carotid system: Negative. Vertebral arteries: Negative.  The left vertebral artery is mildly dominant. CTA HEAD Posterior circulation: Distal vertebral arteries are patent, with mild irregularity of the distal right V4 segment but no distal vertebral stenosis. Normal vertebrobasilar junction. Both PICA origins are patent and within normal limits. Patent basilar artery with mild tortuosity. No basilar stenosis. SCA and PCA origins are normal. Small posterior communicating arteries. Left PCA branches are within normal limits. Right PCA branches are mildly irregular (series 14, image 21). 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 Anterior circulation: Both ICA siphons are patent with no plaque or stenosis identified. Normal ophthalmic and posterior communicating artery origins. Patent carotid termini. Patent MCA and ACA origins. Anterior communicating artery and bilateral ACA branches are within normal limits. Left MCA M1 segment is within normal limits. 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 The vessel enlargement is more fusiform and saccular, although there does appear to be a subtle saccular component directed anteriorly and superiorly on series 13, image 151. The origin of all M2 branches are incorporated. Elsewhere the dominant posterior M2 to M3 branching also appears irregular on series 14, image 32. And other left MCA branches are also irregular on series  14, image 13. 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 Right MCA bifurcation is patent without stenosis. Right MCA branches appear more normal than those on the left side (series 14, image 16). Venous sinuses: Patent. The vein of Galen and the inferior sagittal sinus do appear mildly irregular. But there is no venous sinus filling defect identified. Anatomic variants: Mildly dominant left vertebral artery. Review of the MIP images confirms the above findings IMPRESSION: 1. Positive for intracranial vessel irregularity, most pronounced at the the left MCA and right PCA branches, suspicious for Acute Vasculitis. 2. 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 A conspicuous vessel calcification is noted there, with no superimposed atherosclerosis elsewhere in the head and neck. 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 3.  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 No new abnormality of the brain parenchyma identified by CT. The patient has previously had a right suboccipital craniotomy of unclear significance. 4. Mediastinal and hilar lymphadenopathy again noted, recently biopsied by bronchoscopy. Electronically Signed   By: Odessa Fleming M.D.   On: 05/01/2020 22:48   DG Chest 2 View  Result Date: 05/01/2020 CLINICAL DATA:  Mediastinal lymphadenopathy EXAM: CHEST - 2 VIEW COMPARISON:  Chest CT 04/27/2020 FINDINGS: Stable cardiac silhouette. Again demonstrated bilateral prominence of the hilar structures consistent with hilar lymphadenopathy. No interval change. No lung parenchymal abnormality identified. IMPRESSION: Persistent bilateral hilar lymphadenopathy. Common differential would include sarcoidosis versus lymphoma. Electronically Signed   By: Genevive Bi M.D.   On: 05/01/2020 11:00   CT CHEST WO CONTRAST  Result  Date: 04/27/2020 CLINICAL DATA:  Adenopathy on recent chest radiograph EXAM: CT CHEST WITHOUT CONTRAST TECHNIQUE: Multidetector CT imaging of the chest was performed following the standard protocol without IV contrast. COMPARISON:  Chest radiograph April 25, 2020 and chest CT April 10, 2020. FINDINGS: Cardiovascular: There is no evident thoracic aortic aneurysm. Visualized great vessels appear unremarkable noncontrast enhanced study. There is no pericardial effusion or pericardial thickening. Mediastinum/Nodes: Thyroid appears normal. Adenopathy is again noted at multiple sites. Multiple lymph nodes in the paratracheal regions are noted. A lymph node slightly to the right of the trachea anteriorly measures 1.3 x 1.3 cm. A lymph node anterior to the carina on the right measures 1.7 x 1.2 cm. A lymph node in the aortopulmonary window region measures 1.5 x 1.1 cm. A lymph node to the left of the aortic arch measures 1.9 x 1.5 cm. There are hilar lymph nodes bilaterally, less than optimally discerned due to lack of contrast. A lymph node in the left hilar region measures 1.8 x 1.3 cm. A lymph node in the right hilar region measures 1.8 x 1.3 cm. Several other mildly prominent hilar lymph nodes are noted. There are enlarged subcarinal lymph nodes, largest measuring 2.4 x 1 1.8 cm, marginally larger than on most recent chest CT examination. Occasional calcification noted in hilar lymph nodes on the right. No other lymph node calcification is appreciable. Lungs/Pleura: There is no edema or airspace opacity. There is a stable 3 mm nodular opacity in the anterior segment of the left lobe seen on axial slice 94 series 4. A 4 mm nodular opacity in the lateral segment right lower lobe is stable, seen on axial slice 128 series 4. No pleural effusions evident. There are scattered areas of mild atelectatic change in the left lower lobe. Upper Abdomen: There is a focal aneurysm arising from the splenic artery measuring 2.0  x 1.5 cm with peripheral calcification, stable. Visualized upper abdominal structures otherwise appear unremarkable on this noncontrast study. Musculoskeletal: There is a bone island in the lateral aspect of the L1 vertebral body. No blastic or lytic bone lesions evident. No chest wall lesions. IMPRESSION: 1. Multifocal mediastinal and hilar adenopathy again noted with a subcarinal lymph node slightly larger than on recent CT. Other areas of adenopathy appear essentially stable. Adenopathy of this nature may be seen with granulomatous disease such as tuberculosis as a primary manifestation. Note that neoplasm must be of concern given this finding as well; neoplastic etiology for adenopathy is not excluded on this study. Calcification in right hilar lymph nodes does suggest prior granulomatous disease. This finding may warrant thoracic surgery consultation for potential central lymph node sampling. 2. Stable 3-4 mm nodular opacity on  each side. No edema or airspace opacity no pleural effusions. Mild atelectatic change in the left lower lobe must be considered nonspecific. 3. 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 Clinical significance of this finding questionable. Electronically Signed   By: Bretta Bang III M.D.   On: 04/27/2020 11:11   CT CHEST W CONTRAST  Result Date: 04/10/2020 CLINICAL DATA:  Follow-up abnormal chest x-ray EXAM: CT CHEST WITH CONTRAST TECHNIQUE: Multidetector CT imaging of the chest was performed during intravenous contrast administration. CONTRAST:  75mL ISOVUE-300 IOPAMIDOL (ISOVUE-300) INJECTION 61% COMPARISON:  Chest radiograph 03/26/2020 FINDINGS: Cardiovascular: Normal heart size. No pericardial effusion. Thoracic aorta is normal in caliber. Mediastinum/Nodes: Mediastinal and hilar adenopathy is present. For example, right subcarinal node measuring 1.6 x 2.4 cm (series 2, image 78); right hilar node measuring 3.6 x 2.2 cm (image 85); pretracheal  node measuring 1.4 x 1.2 cm (image 46). Few small foci of right hilar calcification. Thyroid is unremarkable. Esophagus is unremarkable. Lungs/Pleura: No consolidation or mass. There is a 3 mm nodule of the left lower lobe (series 5, image 84). Right lower lobe nodule measuring 4 mm (image 120) Upper Abdomen: No acute abnormality. Musculoskeletal: Focus of sclerosis within the L1 vertebral body probably reflects a bone island. No acute osseous abnormality. IMPRESSION: Mediastinal and bilateral hilar lymphadenopathy. Right lower lobe 4 mm nodule. Left lower lobe 3 mm nodule. No follow-up needed if patient is low-risk (and has no known or suspected primary neoplasm). Non-contrast chest CT can be considered in 12 months depending on the etiology of above. Electronically Signed   By: Guadlupe Spanish M.D.   On: 04/10/2020 16:05   MR ANGIO HEAD WO CONTRAST  Result Date: 04/25/2020 CLINICAL DATA:  Neuro deficit, transient right arm weakness with aphasia. EXAM: MR HEAD WITHOUT CONTRAST MR CIRCLE OF WILLIS WITHOUT CONTRAST MRA OF THE NECK WITHOUT AND WITH CONTRAST TECHNIQUE: Multiplanar, multiecho pulse sequences of the brain, circle of willis and surrounding structures were obtained without intravenous contrast. Angiographic images of the neck were obtained using MRA technique without and with intravenous contrast. CONTRAST:  8.57mL GADAVIST GADOBUTROL 1 MMOL/ML IV SOLN COMPARISON:  None. FINDINGS: MR HEAD FINDINGS Brain: T2/FLAIR hyperintense foci involving the right middle cerebellar peduncle (9:7, 6). No diffusion-weighted signal abnormality. No intracranial hemorrhage. No midline shift, ventriculomegaly or extra-axial fluid collection. No mass lesion. Vascular: Please see CTA head. Skull and upper cervical spine: Normal marrow signal. Sinuses/Orbits: Normal orbits. Clear paranasal sinuses. Trace mastoid effusion. Other: Right frontal scalp susceptibility artifact. MR CIRCLE OF WILLIS FINDINGS Anterior circulation: The  bilateral internal carotid, anterior and middle cerebral arteries are patent and normal caliber. No significant stenosis, proximal occlusion, aneurysm, or vascular malformation. Posterior circulation: Dominant left vertebral artery. Patent bilateral PICA. Dominant right AICA. Normal caliber patent basilar, superior cerebellar and posterior cerebral arteries. No significant stenosis, proximal occlusion, aneurysm, or vascular malformation. Venous sinuses: No evidence of thrombosis. Anatomic variants: Bilateral PCOM hypoplasia. MRA NECK FINDINGS There is no high-grade narrowing or focal aneurysm involving the bilateral carotid arteries. No evidence of dissection. The bilateral vertebral arteries are patent and demonstrate antegrade flow. Dominant left vertebral artery. No evidence of high-grade narrowing or focal aneurysm. Three vessel aortic arch. IMPRESSION: MRI HEAD: T2/FLAIR hyperintense foci involving the right middle cerebellar peduncle are suspicious for demyelination. Consider postcontrast imaging for further evaluation. MRA HEAD: Normal MRA head. MRA NECK: Normal MRA neck. Electronically Signed   By: Stana Bunting M.D.   On: 04/25/2020 16:10  MR Angiogram Neck W or Wo Contrast  Result Date: 04/25/2020 CLINICAL DATA:  Neuro deficit, transient right arm weakness with aphasia. EXAM: MR HEAD WITHOUT CONTRAST MR CIRCLE OF WILLIS WITHOUT CONTRAST MRA OF THE NECK WITHOUT AND WITH CONTRAST TECHNIQUE: Multiplanar, multiecho pulse sequences of the brain, circle of willis and surrounding structures were obtained without intravenous contrast. Angiographic images of the neck were obtained using MRA technique without and with intravenous contrast. CONTRAST:  8.8mL GADAVIST GADOBUTROL 1 MMOL/ML IV SOLN COMPARISON:  None. FINDINGS: MR HEAD FINDINGS Brain: T2/FLAIR hyperintense foci involving the right middle cerebellar peduncle (9:7, 6). No diffusion-weighted signal abnormality. No intracranial hemorrhage. No  midline shift, ventriculomegaly or extra-axial fluid collection. No mass lesion. Vascular: Please see CTA head. Skull and upper cervical spine: Normal marrow signal. Sinuses/Orbits: Normal orbits. Clear paranasal sinuses. Trace mastoid effusion. Other: Right frontal scalp susceptibility artifact. MR CIRCLE OF WILLIS FINDINGS Anterior circulation: The bilateral internal carotid, anterior and middle cerebral arteries are patent and normal caliber. No significant stenosis, proximal occlusion, aneurysm, or vascular malformation. Posterior circulation: Dominant left vertebral artery. Patent bilateral PICA. Dominant right AICA. Normal caliber patent basilar, superior cerebellar and posterior cerebral arteries. No significant stenosis, proximal occlusion, aneurysm, or vascular malformation. Venous sinuses: No evidence of thrombosis. Anatomic variants: Bilateral PCOM hypoplasia. MRA NECK FINDINGS There is no high-grade narrowing or focal aneurysm involving the bilateral carotid arteries. No evidence of dissection. The bilateral vertebral arteries are patent and demonstrate antegrade flow. Dominant left vertebral artery. No evidence of high-grade narrowing or focal aneurysm. Three vessel aortic arch. IMPRESSION: MRI HEAD: T2/FLAIR hyperintense foci involving the right middle cerebellar peduncle are suspicious for demyelination. Consider postcontrast imaging for further evaluation. MRA HEAD: Normal MRA head. MRA NECK: Normal MRA neck. Electronically Signed   By: Stana Bunting M.D.   On: 04/25/2020 16:10   MR BRAIN WO CONTRAST  Result Date: 04/25/2020 CLINICAL DATA:  Neuro deficit, transient right arm weakness with aphasia. EXAM: MR HEAD WITHOUT CONTRAST MR CIRCLE OF WILLIS WITHOUT CONTRAST MRA OF THE NECK WITHOUT AND WITH CONTRAST TECHNIQUE: Multiplanar, multiecho pulse sequences of the brain, circle of willis and surrounding structures were obtained without intravenous contrast. Angiographic images of the neck were  obtained using MRA technique without and with intravenous contrast. CONTRAST:  8.65mL GADAVIST GADOBUTROL 1 MMOL/ML IV SOLN COMPARISON:  None. FINDINGS: MR HEAD FINDINGS Brain: T2/FLAIR hyperintense foci involving the right middle cerebellar peduncle (9:7, 6). No diffusion-weighted signal abnormality. No intracranial hemorrhage. No midline shift, ventriculomegaly or extra-axial fluid collection. No mass lesion. Vascular: Please see CTA head. Skull and upper cervical spine: Normal marrow signal. Sinuses/Orbits: Normal orbits. Clear paranasal sinuses. Trace mastoid effusion. Other: Right frontal scalp susceptibility artifact. MR CIRCLE OF WILLIS FINDINGS Anterior circulation: The bilateral internal carotid, anterior and middle cerebral arteries are patent and normal caliber. No significant stenosis, proximal occlusion, aneurysm, or vascular malformation. Posterior circulation: Dominant left vertebral artery. Patent bilateral PICA. Dominant right AICA. Normal caliber patent basilar, superior cerebellar and posterior cerebral arteries. No significant stenosis, proximal occlusion, aneurysm, or vascular malformation. Venous sinuses: No evidence of thrombosis. Anatomic variants: Bilateral PCOM hypoplasia. MRA NECK FINDINGS There is no high-grade narrowing or focal aneurysm involving the bilateral carotid arteries. No evidence of dissection. The bilateral vertebral arteries are patent and demonstrate antegrade flow. Dominant left vertebral artery. No evidence of high-grade narrowing or focal aneurysm. Three vessel aortic arch. IMPRESSION: MRI HEAD: T2/FLAIR hyperintense foci involving the right middle cerebellar peduncle are suspicious for demyelination. Consider postcontrast imaging  for further evaluation. MRA HEAD: Normal MRA head. MRA NECK: Normal MRA neck. Electronically Signed   By: Stana Bunting M.D.   On: 04/25/2020 16:10   MR BRAIN W CONTRAST  Result Date: 04/26/2020 CLINICAL DATA:  Follow-up examination  for demyelinating disease, headaches, fevers. EXAM: MRI HEAD WITH CONTRAST TECHNIQUE: Multiplanar, multiecho pulse sequences of the brain and surrounding structures were obtained with intravenous contrast. CONTRAST:  7.66mL GADAVIST GADOBUTROL 1 MMOL/ML IV SOLN COMPARISON:  Prior brain MRI from 04/25/2020. FINDINGS: Thin-section FLAIR sequences were performed prior to the administration of IV contrast. Images demonstrate diffuse abnormal FLAIR signal intensity involving the cortical sulci/leptomeningeal both cerebral hemispheres, most pronounced at the parieto-occipital regions, fairly symmetric in nature. Additionally, abnormal subtle abnormal leptomeningeal FLAIR signal intensity seen within the right greater than left cerebellum. Patchy parenchymal signal abnormality noted at the right middle cerebellar peduncle, corresponding with abnormality on prior brain MRI. Abnormal FLAIR signal extends to involve the right 7/8 complex and right internal auditory canal, with probable involvement of the right inner ear structures as well (series 3, image 39). Suspected trace intraventricular debris (series 3, image 77). Following contrast administration, scattered leptomeningeal enhancement seen about the right greater than left cerebellum. Mild patchy parenchymal enhancement noted at the level of the right middle cerebellar peduncle signal abnormality (series 5, image 16). Additionally, prominent leptomeningeal and/or dural enhancement noted at the right CP angle cistern, extending into the right IAC (series 5, image 15). No restricted diffusion seen at the level of the right CP angle cistern on prior brain MRI to suggest abscess at this location. Extension to involve the upper cervical spinal cord again noted, described on corresponding cervical spine MRI. Subtle leptomeningeal enhancement noted about the parieto-occipital regions as well. Given the history of in fevers, headaches, and abnormal CSF values, findings most  concerning for acute meningitis with associated cerebritis at the right middle cerebellar peduncle. No frank abscess or other collection. Normal opacification of the major dural sinuses noted. No appreciable ependymal enhancement to suggest associated ventriculitis. 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 Given the basilar predilection, atypical etiologies including TB and/or fungal meningitis could be considered. No discrete abscess, hydrocephalus, or other complication at this time. Electronically Signed   By: Rise Mu M.D.   On: 04/26/2020 01:37   MR BRAIN W WO CONTRAST  Result Date: 05/01/2020 CLINICAL DATA:  Brain mass or lesion; follow up. EXAM: MRI HEAD WITHOUT AND WITH CONTRAST TECHNIQUE: Multiplanar, multiecho pulse sequences of the brain and surrounding structures were obtained without and with intravenous contrast. CONTRAST:  7.21mL GADAVIST GADOBUTROL 1 MMOL/ML IV SOLN COMPARISON:  MRI of the brain April 25 2020 FINDINGS: Brain: No acute infarction, hemorrhage, hydrocephalus or mass lesion. No significant interval change of the leptomeningeal thickening and contrast enhancement along the bilateral cerebellar sulci, brainstem surface predominantly on the right side and more pronounced on the right, extending into the right internal auditory canal. Small collection in the right cerebellopontine angle cistern, measuring approximately 1.3 x 0.5 mm, unchanged from prior. 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 Vascular: Flow voids are preserved. Ectatic left MCA/M2 posterior division branch (series 6, images 12 and 13). Given ongoing infectious process, evaluation with CT angiogram is suggested to exclude mycotic aneurysm. Skull  and upper cervical spine: Normal marrow signal. Sinuses/Orbits: Negative.  Other: Small left mastoid effusion. IMPRESSION: 1. No significant interval change of the basal leptomeningeal process with small collection in the right cerebellopontine angle and associated right middle cerebellar peduncle cerebritis. 2. Ectatic left MCA/M2 posterior division branch. Given ongoing infectious process, evaluation with CT angiogram is suggested to exclude mycotic aneurysm. These results will be called to the ordering clinician or representative by the Radiologist Assistant, and communication documented in the PACS or [employer]. Electronically Signed   By: Baldemar Lenis M.D.   On: 05/01/2020 11:07   MR CERVICAL SPINE W WO CONTRAST  Addendum Date: 04/26/2020   ADDENDUM REPORT: 04/26/2020 01:15 ADDENDUM: Upon further consideration and review of the corresponding postcontrast brain MRI, note is made of subtle circumferential leptomeningeal enhancement about the upper cervical spinal cord, extending from the cervicomedullary region to approximately C2-3. Associated leptomeningeal enhancement noted about the brainstem and cerebellum, better evaluated on corresponding brain MRI. Given the history of fevers, headaches, and abnormal CSF values, findings most concerning for acute infection/meningitis. No associated cord edema or frank intramedullary enhancement. Electronically Signed   By: Rise Mu M.D.   On: 04/26/2020 01:15   Result Date: 04/26/2020 CLINICAL DATA:  Initial evaluation for demyelinating disease. EXAM: MRI CERVICAL SPINE WITHOUT AND WITH CONTRAST TECHNIQUE: Multiplanar and multiecho pulse sequences of the cervical spine, to include the craniocervical junction and cervicothoracic junction, were obtained without and with intravenous contrast. CONTRAST:  7.43mL GADAVIST GADOBUTROL 1 MMOL/ML IV SOLN COMPARISON:  Prior brain MRI from 04/25/2020. FINDINGS: Alignment: Physiologic with  preservation of the normal cervical lordosis. No listhesis. Vertebrae: 1 cm T2/stir hyperintense lesion within the C6 vertebral body most likely reflects an atypical hemangioma. Associated minimal central vertebral body height loss. Additional small benign hemangioma noted within the C7 vertebral body. No other discrete or worrisome osseous lesions. Underlying bone marrow signal intensity within normal limits. Vertebral body height otherwise maintained without acute or chronic fracture. No other abnormal marrow edema or enhancement. Cord: Normal signal and morphology. No cord signal changes to suggest demyelinating disease. No abnormal enhancement. Posterior Fossa, vertebral arteries, paraspinal tissues: Negative. Disc levels: C2-C3: Unremarkable. C3-C4: Minimal annular disc bulge with uncovertebral hypertrophy. No significant spinal stenosis. Foramina remain patent. C4-C5: Mild annular disc bulge with uncovertebral hypertrophy. No significant spinal stenosis. Foramina remain patent. C5-C6: Disc desiccation with mild annular disc bulge. No spinal stenosis. Foramina remain patent. C6-C7: Disc desiccation without significant disc bulge. No canal or foraminal stenosis. No impingement. C7-T1:  Unremarkable. Visualized upper thoracic spine demonstrates no significant finding. IMPRESSION: 1. Normal MRI of the cervical spinal cord. No evidence for demyelinating disease. No abnormal enhancement. 2. 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 No significant spinal stenosis or neural foraminal narrowing. Electronically Signed: By: Rise Mu M.D. On: 04/26/2020 00:59   DG Chest Portable 1 View  Result Date: 04/25/2020 CLINICAL DATA:  Weakness. EXAM: PORTABLE CHEST 1 VIEW COMPARISON:  April 22, 2020 FINDINGS: The cardiac silhouette is normal. There is bilateral hilar lymphadenopathy similar to the prior studies There is no evidence of focal airspace consolidation, pleural effusion or pneumothorax. Osseous  structures are without acute abnormality. Soft tissues are grossly normal. IMPRESSION: Bilateral hilar lymphadenopathy similar to the prior studies. Electronically Signed   By: Ted Mcalpine M.D.   On: 04/25/2020 12:14   DG Chest Portable 1 View  Result Date: 04/22/2020 CLINICAL DATA:  Fever, dyspnea EXAM: PORTABLE CHEST 1 VIEW COMPARISON:  03/26/2020, CT 04/10/2020 FINDINGS: Lungs are clear. No pneumothorax or pleural effusion.  Cardiac size is within normal limits. Bilateral hilar fullness related to underlying hilar adenopathy is again seen and is unchanged. Pulmonary vascularity is normal. No acute bone abnormality. IMPRESSION: Stable hilar adenopathy. No radiographic evidence of acute cardiopulmonary disease. Electronically Signed   By: Helyn Numbers MD   On: 04/22/2020 00:21   ECHOCARDIOGRAM COMPLETE  Result Date: 05/01/2020    ECHOCARDIOGRAM REPORT   Patient Name:   VIRGLE ARTH Date of Exam: 05/01/2020 Medical Rec #:  [id number]      Height:       73.0 in Accession #:    [id number]     Weight:       173.5 lb Date of Birth:  07-Nov-1975      BSA:          2.026 m Patient Age:    44 years       BP:           119/76 mmHg Patient Gender: M              HR:           105 bpm. Exam Location:  Inpatient Procedure: 2D Echo Indications:    Endocarditis I38  History:        Patient has no prior history of Echocardiogram examinations.  Sonographer:    Thurman Coyer RDCS (AE) Referring Phys: [id number] ANKIT CHIRAG AMIN IMPRESSIONS  1. Left ventricular ejection fraction, by estimation, is 50 to 55%. The left ventricle has low normal function. The left ventricle has no regional wall motion abnormalities. Left ventricular diastolic parameters were normal.  2. Right ventricular systolic function is normal. The right ventricular size is normal.  3. The mitral valve is normal in structure. Trivial mitral valve regurgitation. No evidence of mitral stenosis.  4. The aortic valve has an indeterminant number of  cusps. Aortic valve regurgitation is not visualized. No aortic stenosis is present.  5. The inferior vena cava is normal in size with greater than 50% respiratory variability, suggesting right atrial pressure of 3 mmHg. FINDINGS  Left Ventricle: Left ventricular ejection fraction, by estimation, is 50 to 55%. The left ventricle has low normal function. The left ventricle has no regional wall motion abnormalities. The left ventricular internal cavity size was normal in size. There is no left ventricular hypertrophy. Left ventricular diastolic parameters were normal. Right Ventricle: The right ventricular size is normal. Right ventricular systolic function is normal. Left Atrium: Left atrial size was normal in size. Right Atrium: Right atrial size was normal in size. Pericardium: There is no evidence of pericardial effusion. Mitral Valve: The mitral valve is normal in structure. Trivial mitral valve regurgitation. No evidence of mitral valve stenosis. Tricuspid Valve: The tricuspid valve is normal in structure. Tricuspid valve regurgitation is trivial. No evidence of tricuspid stenosis. Aortic Valve: The aortic valve has an indeterminant number of cusps. Aortic valve regurgitation is not visualized. No aortic stenosis is present. Pulmonic Valve: The pulmonic valve was not well visualized. Pulmonic valve regurgitation is not visualized. No evidence of pulmonic stenosis. Aorta: The aortic root is normal in size and structure. Venous: The inferior vena cava is normal in size with greater than 50% respiratory variability, suggesting right atrial pressure of 3 mmHg. IAS/Shunts: No atrial level shunt detected by color flow Doppler.  LEFT VENTRICLE PLAX 2D LVIDd:         3.80 cm  Diastology LVIDs:         2.95 cm  LV e' medial:  8.38  cm/s LV PW:         1.10 cm  LV e' lateral: 8.59 cm/s LV IVS:        1.10 cm LVOT diam:     2.30 cm LV SV:         51 LV SV Index:   25 LVOT Area:     4.15 cm  RIGHT VENTRICLE RV S prime:      12.60 cm/s LEFT ATRIUM             Index       RIGHT ATRIUM          Index LA diam:        2.80 cm 1.38 cm/m  RA Area:     8.59 cm LA Vol (A2C):   40.3 ml 19.90 ml/m RA Volume:   14.50 ml 7.16 ml/m LA Vol (A4C):   36.3 ml 17.92 ml/m LA Biplane Vol: 38.9 ml 19.21 ml/m  AORTIC VALVE LVOT Vmax:   87.20 cm/s LVOT Vmean:  56.300 cm/s LVOT VTI:    0.122 m  AORTA Ao Root diam: 3.20 cm  SHUNTS Systemic VTI:  0.12 m Systemic Diam: 2.30 cm Olga Millers MD Electronically signed by Olga Millers MD Signature Date/Time: 05/01/2020/2:11:41 PM    Final    CT ANGIO NECK CODE STROKE  Result Date: 05/01/2020 CLINICAL DATA:  44 year old male with abnormal MRIs recently suspicious for basilar meningitis, cerebritis. Ectatic left MCA M2 posterior branch, query mycotic aneurysm. Status post COVID-19 1 month ago. Status post bronchoscopy with bronchoscopic biopsy of mediastinal lymphadenopathy yesterday. Pathology results pending. EXAM: CT ANGIOGRAPHY HEAD AND NECK TECHNIQUE: Multidetector CT imaging of the head and neck was performed using the standard protocol during bolus administration of intravenous contrast. Multiplanar CT image reconstructions and MIPs were obtained to evaluate the vascular anatomy. Carotid stenosis measurements (when applicable) are obtained utilizing NASCET criteria, using the distal internal carotid diameter as the denominator. CONTRAST:  75mL OMNIPAQUE IOHEXOL 350 MG/ML SOLN COMPARISON:  Brain MRI earlier today. Brain MRI and intracranial MRA 04/25/2020. chest CT 04/10/2020. FINDINGS: CT HEAD Brain: No intracranial hemorrhage is identified. Largely unremarkable noncontrast CT appearance of the basilar cisterns. No posterior fossa encephalomalacia identified on the right although overlying previous right suboccipital craniectomy (series 5, image 9). Conspicuous vascular calcification at the left MCA trifurcation (series 5, image 15 and series 8, image 25), although no other intracranial calcified  plaque is evident. No ventriculomegaly. No cortically based acute infarct identified. Gray-white matter differentiation within normal limits. Calvarium and skull base: Previous right suboccipital craniectomy. No acute osseous abnormality identified. Paranasal sinuses: Visualized paranasal sinuses and mastoids are stable and well pneumatized. Orbits: No acute orbit or scalp soft tissue finding. CTA NECK Skeleton: Carious posterior dentition on the left. Benign-appearing C6 vertebral body hemangioma. No acute or suspicious osseous lesion identified. Upper chest: Mediastinal and hilar lymphadenopathy redemonstrated. Visible lungs are clear. Other neck: Negative.  No cervical lymphadenopathy. Aortic arch: 3 vessel arch configuration.  No arch atherosclerosis. Right carotid system: Negative. Left carotid system: Negative. Vertebral arteries: Negative.  The left vertebral artery is mildly dominant. CTA HEAD Posterior circulation: Distal vertebral arteries are patent, with mild irregularity of the distal right V4 segment but no distal vertebral stenosis. Normal vertebrobasilar junction. Both PICA origins are patent and within normal limits. Patent basilar artery with mild tortuosity. No basilar stenosis. SCA and PCA origins are normal. Small posterior communicating arteries. Left PCA branches are within normal limits. Right PCA branches are mildly  irregular (series 14, image 21). 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 Anterior circulation: Both ICA siphons are patent with no plaque or stenosis identified. Normal ophthalmic and posterior communicating artery origins. Patent carotid termini. Patent MCA and ACA origins. Anterior communicating artery and bilateral ACA branches are within normal limits. Left MCA M1 segment is within normal limits. The left MCA trifurcation is ectatic (series 13 images 149 and 150) and there is a small calcification associated along  the superior distal M1 (image 148). The vessel enlargement is more fusiform and saccular, although there does appear to be a subtle saccular component directed anteriorly and superiorly on series 13, image 151. The origin of all M2 branches are incorporated. Elsewhere the dominant posterior M2 to M3 branching also appears irregular on series 14, image 32. And other left MCA branches are also irregular on series 14, image 13. 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 Right MCA bifurcation is patent without stenosis. Right MCA branches appear more normal than those on the left side (series 14, image 16). Venous sinuses: Patent. The vein of Galen and the inferior sagittal sinus do appear mildly irregular. But there is no venous sinus filling defect identified. Anatomic variants: Mildly dominant left vertebral artery. Review of the MIP images confirms the above findings IMPRESSION: 1. Positive for intracranial vessel irregularity, most pronounced at the the left MCA and right PCA branches, suspicious for Acute Vasculitis. 2. 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 A conspicuous vessel calcification is noted there, with no superimposed atherosclerosis elsewhere in the head and neck. 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 3. 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 No new abnormality of the brain parenchyma identified by CT. The patient has previously had a right suboccipital craniotomy of unclear significance. 4. Mediastinal and hilar lymphadenopathy again noted, recently biopsied by bronchoscopy. Electronically Signed   By: Odessa Fleming M.D.   On: 05/01/2020 22:48    Labs:  CBC: Recent Labs    04/25/20 1118 04/26/20 1620 05/01/20 0806 05/02/20 0509  WBC 4.5 7.5 5.2 5.3  HGB 15.1 13.8 13.9 12.6*  HCT 44.8 41.2  40.7 37.4*  PLT 200 210 244 212    COAGS: Recent Labs    04/30/20 0852  INR 1.1  APTT 32    BMP: Recent Labs    04/28/20 0736 04/29/20 1122 05/01/20 0806 05/02/20 0509  NA 134* 132* 132* 133*  K 4.6 4.5 4.8 4.1  CL 101 96* 97* 101  CO2 26 27 25 23   GLUCOSE 98 110* 105* 108*  BUN 11 9 9 7   CALCIUM 9.1 9.5 8.8* 8.7*  CREATININE 0.90 0.91 0.77 0.78  GFRNONAA >60 >60 >60 >60  GFRAA >60 >60 >60 >60    LIVER FUNCTION TESTS: Recent Labs    04/26/20 1620 04/27/20 0438 04/28/20 0736 04/29/20 1122  BILITOT 1.7* 1.0 1.0 1.0  AST 15 14* 12* 13*  ALT 25 24 23 25   ALKPHOS 38 35* 39 36*  PROT 6.4* 6.6 6.6 6.8  ALBUMIN 3.4* 3.5 3.5 3.6    TUMOR MARKERS: No results for input(s): AFPTM, CEA, CA199, CHROMGRNA in the last 8760 hours.  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 Patient and wife with several questions such as if there are other tests they can  do that are less invasive and what type of treatment would be needed, if any. We reviewed mycotic aneurysm as a type of pseudoaneurysm which carries a risk of rupture which could potentially be life threatening so the best way to view the size/shape of the aneurysm is via angiogram - additionally this would allow [place] to plan for any future treatment, if needed. We discussed typical aneurysm treatment in NIR which includes embolization/stent placement, although if any treatment was needed the type would ultimately be determined by Dr. Corliss Skains. They are in agreement to proceed.  Plan for diagnostic cerebral angiogram on Monday (10/4) in NIR - patient to be NPO at midnight on 10/4, hold anticoagulation until post procedure, AM labs pending, IR will call for patient when ready.  Risks and benefits of diagnostic cerebral angiogram were discussed with the patient including, but not limited to bleeding, infection, vascular injury, stroke, or contrast induced renal  failure.  This interventional procedure involves the use of X-rays and because of the nature of the planned procedure, it is possible that we will have prolonged use of X-ray fluoroscopy.  Potential radiation risks to you include (but are not limited to) the following: - A slightly elevated risk for cancer  several years later in life. This risk is typically less than 0.5% percent. This risk is low in comparison to the normal incidence of human cancer, which is 33% for women and 50% for men according to the [employer]. - Radiation induced injury can include skin redness, resembling a rash, tissue breakdown / ulcers and hair loss (which can be temporary or permanent).  The likelihood of either of these occurring depends on the difficulty of the procedure and whether you are sensitive to radiation due to previous procedures, disease, or genetic conditions.  IF your procedure requires a prolonged use of radiation, you will be notified and given written instructions for further action.  It is your responsibility to monitor the irradiated area for the 2 weeks following the procedure and to notify your physician if you are concerned that you have suffered a radiation induced injury.    All of the patient's questions were answered, patient is agreeable to proceed.  Consent signed and in chart.  Thank you for this interesting consult.  I greatly enjoyed meeting Aaron Chapman and look forward to participating in their care.  A copy of this report was sent to the requesting provider on this date.  Electronically Signed: Villa Herb, PA-C 05/02/2020, 2:21 PM   I spent a total of 40 Minutes in face to face in clinical consultation, greater than 50% of which was counseling/coordinating care for diagnostic cerebral angiogram.

## 2020-05-03 ENCOUNTER — Encounter (HOSPITAL_COMMUNITY): Payer: Self-pay | Admitting: Pulmonary Disease

## 2020-05-03 LAB — CBC
HCT: 34.7 % — ABNORMAL LOW (ref 39.0–52.0)
Hemoglobin: 11.6 g/dL — ABNORMAL LOW (ref 13.0–17.0)
MCH: 30.1 pg (ref 26.0–34.0)
MCHC: 33.4 g/dL (ref 30.0–36.0)
MCV: 89.9 fL (ref 80.0–100.0)
Platelets: 186 10*3/uL (ref 150–400)
RBC: 3.86 MIL/uL — ABNORMAL LOW (ref 4.22–5.81)
RDW: 12.5 % (ref 11.5–15.5)
WBC: 3.5 10*3/uL — ABNORMAL LOW (ref 4.0–10.5)
nRBC: 0 % (ref 0.0–0.2)

## 2020-05-03 LAB — BASIC METABOLIC PANEL
Anion gap: 7 (ref 5–15)
BUN: 6 mg/dL (ref 6–20)
CO2: 23 mmol/L (ref 22–32)
Calcium: 8.6 mg/dL — ABNORMAL LOW (ref 8.9–10.3)
Chloride: 102 mmol/L (ref 98–111)
Creatinine, Ser: 0.65 mg/dL (ref 0.61–1.24)
GFR calc Af Amer: >60 mL/min (ref >60)
GFR calc non Af Amer: >60 mL/min (ref >60)
Glucose, Bld: 133 mg/dL — ABNORMAL HIGH (ref 70–99)
Potassium: 4 mmol/L (ref 3.5–5.1)
Sodium: 132 mmol/L — ABNORMAL LOW (ref 135–145)

## 2020-05-03 LAB — MAGNESIUM: Magnesium: 1.8 mg/dL (ref 1.7–2.4)

## 2020-05-03 LAB — CYTOLOGY - NON PAP

## 2020-05-03 MED ORDER — POLYETHYLENE GLYCOL 3350 17 G PO PACK
17.0000 g | PACK | Freq: Every day | ORAL | Status: DC
  Administered 2020-05-03 – 2020-05-05 (×2): 17 g via ORAL
  Filled 2020-05-03 (×2): qty 1

## 2020-05-03 NOTE — Progress Notes (Signed)
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  Outpatient he had received a course of steroids and completed course of Augmentin.  Due to persistent fever had CT chest which showed right lower lobe nodule, left lower lobe nodule and bilateral hilar lymphadenopathy.  Some focal sclerosing lesion in the L1 vertebral body.  Later also developed truncal and bilateral upper and lower extremity rash.  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  ID consulted and started on steroids and RIPE therapy.  Pulmonary team was consulted and underwent bronchoscopy/EBUS on 9/30.  Thereafter spiked fever overnight.  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  Case discussed with neuro interventional and neurology who recommended cerebral angiogram.   Assessment & Plan:   Active Problems:   Fever and chills   Bilateral headaches   Meningitis   Recurrent fevers with headaches.   Bilateral hilar lymphadenopathy/mediastinal lymphadenopathy Bilateral pulmonary nodules -Initially on RIPE therapy has not been discontinued due to negative QuantiFERON/AFB studies.  There is concerns for lymphoma/sarcoidosis along with acute vasculitis. -On Decadron 8 mg every 6 hours. -CSF studies-for AFB-negative.  Not enough CSF fluid for fungal cultures. -HIV-negative -QuantiFERON test-negative.  CSF AFB smears -CT chest without contrast 9/27-multifocal mediastinal/hilar adenopathy, calcification of right hilar nodes, granulomatous disease?  3-4 mm stable nodule.  -Status post bronc/EBUS by pulmonary, biopsy did not show any malignant cells. -Repeat MRI brain  shows mycotic aneurysm.  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  No infectious etiology -Echocardiogram-overall unremarkable.  No obvious signs of infection -Case discussed with on-call neurology, Dr. Theda Sers and Dr. Estanislado Pandy from neuro interventional on 10/2 -tentative plans for cerebral angiogram on Monday.  Bowel regimen ordered for constipation  DVT prophylaxis: heparin injection 5,000 Units Start: 04/26/20 1400, can be held prior to procedure Code Status: Full code Family Communication:   Status is: Inpatient  Remains inpatient appropriate because:Inpatient level of care appropriate due to severity of illness  Dispo: The patient is from: Home              Anticipated d/c is to: Home              Anticipated d/c date is: > 3 days              Patient currently is not medically stable to d/c.  Ongoing evaluation for acute vasculitis/fevers and hilar lymphadenopathy.  Quite complicated case in terms of working up diagnosis.   Body mass index is 23.04 kg/m.  Subjective: Feels okay no complaints today.  Has not had bowel movement in couple of days therefore advised nursing staff to give him aggressive bowel regimen today.    Examination: Constitutional: Not in acute distress Respiratory: Clear to auscultation bilaterally Cardiovascular: Normal sinus rhythm, no rubs Abdomen: Nontender nondistended good bowel sounds Musculoskeletal: No edema noted Skin: No rashes seen Neurologic: CN 2-12 grossly intact.  And nonfocal Psychiatric: Normal judgment and insight. Alert and oriented x 3. Normal mood.  Objective: Vitals:   05/02/20 1000 05/02/20 1718 05/02/20 2112 05/03/20 0526  BP: 118/80 123/90 137/85 124/79  Pulse: 92 (!) 102 100 83  Resp:  _0 Temp: 98 F (36.7 C) 99.6 F (37.6 C) 98.9 F (37.2 C) 97.9 F (36.6 C)  TempSrc: Oral Oral Oral Oral  SpO2: 98% 94% 97% 97%  Weight:   79.2 kg   Height:        Intake/Output Summary  (Last 24 hours) at 05/03/2020 0808 Last data filed at 05/03/2020 0600 Gross per 24 hour  Intake 2985.59 ml  Output 2700 ml  Net 285.59 ml   Filed Weights   04/30/20 2013 05/01/20 2116 05/02/20 2112  Weight: 78.7 kg 79.2 kg 79.2 kg     Data Reviewed:   CBC: Recent Labs  Lab 04/26/20 1620 05/01/20 0806 05/02/20 0509 05/03/20 0201  WBC 7.5 5.2 5.3 3.5*  HGB 13.8 13.9 12.6* 11.6*  HCT 41.2 40.7 37.4* 34.7*  MCV 91.4 90.2 89.9 89.9  PLT 210 244 212 324   Basic Metabolic Panel: Recent Labs  Lab 04/28/20 0736 04/28/20 0736 04/29/20 1122 04/30/20 0852 05/01/20 0806 05/02/20 0509 05/03/20 0201  NA 134*  --  132*  --  132* 133* 132*  K 4.6  --  4.5  --  4.8 4.1 4.0  CL 101  --  96*  --  97* 101 102  CO2 26  --  27  --  _1 GLUCOSE 98  --  110*  --  105* 108* 133*  BUN 11  --  9  --  _2 CREATININE 0.90  --  0.91  --  0.77 0.78 0.65  CALCIUM 9.1  --  9.5  --  8.8* 8.7* 8.6*  MG 1.8   < > 2.0 2.0 2.0 2.0 1.8   < > = values in this interval not displayed.   GFR: Estimated Creatinine Clearance: 129.3 mL/min (by C-G formula based on SCr of 0.65 mg/dL). Liver Function Tests: Recent Labs  Lab 04/26/20 1620 04/27/20 0438 04/28/20 0736 04/29/20 1122  AST 15 14* 12* 13*  ALT _3 ALKPHOS 38 35* 39 36*  BILITOT 1.7* 1.0 1.0 1.0  PROT 6.4* 6.6 6.6 6.8  ALBUMIN 3.4* 3.5 3.5 3.6   No results for input(s): LIPASE, AMYLASE in the last 168 hours. No results for input(s): AMMONIA in the last 168 hours. Coagulation Profile: Recent Labs  Lab 04/30/20 0852  INR 1.1   Cardiac Enzymes: No results for input(s): CKTOTAL, CKMB, CKMBINDEX, TROPONINI in the last 168 hours. BNP (last 3 results) No results for input(s): PROBNP in the last 8760 hours. HbA1C: No results for input(s): HGBA1C in the last 72 hours. CBG: No results for input(s): GLUCAP in the last 168 hours. Lipid Profile: No results for input(s): CHOL, HDL, LDLCALC, TRIG, CHOLHDL, LDLDIRECT in the  last 72 hours. Thyroid Function Tests: No results for input(s): TSH, T4TOTAL, FREET4, T3FREE, THYROIDAB in the last 72 hours. Anemia Panel: No results for input(s): VITAMINB12, FOLATE, FERRITIN, TIBC, IRON, RETICCTPCT in the last 72 hours. Sepsis Labs: No results for input(s): PROCALCITON, LATICACIDVEN in the last 168 hours.  Recent Results (from the past 240 hour(s))  CSF culture     Status: None   Collection Time: 04/25/20  4:50 PM   Specimen: CSF; Cerebrospinal Fluid  Result Value Ref Range Status   Specimen Description CSF  Final   Special Requests NONE  Final   Gram Stain   Final    ABUNDANT WBC PRESENT,BOTH PMN AND MONONUCLEAR NO ORGANISMS SEEN    Culture   Final  NO GROWTH Performed at [facility], [place] [address] [place], [place] [address]    Report Status 04/29/2020 FINAL  Final  Culture, fungus without smear     Status: None (Preliminary result)   Collection Time: 04/25/20  4:50 PM   Specimen: CSF; Cerebrospinal Fluid  Result Value Ref Range Status   Specimen Description CSF  Final   Special Requests NONE  Final   Culture   Final    NO FUNGUS ISOLATED AFTER 6 DAYS Performed at [facility], [place] [address], [place], [place] [address]    Report Status PENDING  Incomplete  Anaerobic culture     Status: None   Collection Time: 04/25/20  8:00 PM   Specimen: CSF; Cerebrospinal Fluid  Result Value Ref Range Status   Specimen Description CSF TUBE 3  Final   Special Requests NONE  Final   Gram Stain   Final    ABUNDANT WBC PRESENT,BOTH PMN AND MONONUCLEAR NO ORGANISMS SEEN    Culture   Final    NO ANAEROBES ISOLATED Performed at [facility], [place] [address], [place], [place] [address]    Report Status 05/02/2020 FINAL  Final  Culture, blood (routine x 2)     Status: None   Collection Time: 04/26/20 12:59 AM   Specimen: BLOOD  Result Value Ref Range Status   Specimen Description BLOOD RIGHT ARM  Final   Special Requests   Final    BOTTLES  DRAWN AEROBIC AND ANAEROBIC Blood Culture adequate volume   Culture   Final    NO GROWTH 5 DAYS Performed at [facility], [address], [place], [place] [address]    Report Status 05/01/2020 FINAL  Final  Culture, blood (routine x 2)     Status: None   Collection Time: 04/26/20  1:04 AM   Specimen: BLOOD  Result Value Ref Range Status   Specimen Description BLOOD RIGHT HAND  Final   Special Requests   Final    BOTTLES DRAWN AEROBIC AND ANAEROBIC Blood Culture adequate volume   Culture   Final    NO GROWTH 5 DAYS Performed at [facility], [place] [address], [place], [place] [address]    Report Status 05/01/2020 FINAL  Final  Acid Fast Smear (AFB)     Status: None   Collection Time: 04/30/20 10:13 AM   Specimen: Bronchial Alveolar Lavage; Respiratory  Result Value Ref Range Status   AFB Specimen Processing Concentration  Final   Acid Fast Smear Negative  Final    Comment: (NOTE) Performed At: [facility] [place], [place] [id number] Rush Farmer MD PT:[id number]    Source (AFB) BRONCHIAL ALVEOLAR LAVAGE  Final    Comment: Performed at [facility], [place] [address], [place], [place] [address]  Aerobic/Anaerobic Culture (surgical/deep wound)     Status: None (Preliminary result)   Collection Time: 04/30/20 10:13 AM   Specimen: Bronchoalveolar Lavage  Result Value Ref Range Status   Specimen Description BRONCHIAL ALVEOLAR LAVAGE  Final   Special Requests RIGHT MIDDLE LOBE  Final   Gram Stain   Final    MODERATE WBC PRESENT,BOTH PMN AND MONONUCLEAR NO ORGANISMS SEEN Performed at [facility], [place] [address], [place], [place] [address]    Culture   Final    RARE Normal respiratory flora-no Staph aureus or Pseudomonas seen NO ANAEROBES ISOLATED; CULTURE IN PROGRESS FOR 5 DAYS    Report Status  PENDING  Incomplete         Radiology Studies: CT ANGIO HEAD W OR WO CONTRAST  Result Date: 05/01/2020 CLINICAL DATA:  44 year old  male with abnormal MRIs recently suspicious for basilar meningitis, cerebritis. Ectatic left MCA M2 posterior branch, query mycotic aneurysm. Status post COVID-19 1 month ago. Status post bronchoscopy with bronchoscopic biopsy of mediastinal lymphadenopathy yesterday. Pathology results pending. EXAM: CT ANGIOGRAPHY HEAD AND NECK TECHNIQUE: Multidetector CT imaging of the head and neck was performed using the standard protocol during bolus administration of intravenous contrast. Multiplanar CT image reconstructions and MIPs were obtained to evaluate the vascular anatomy. Carotid stenosis measurements (when applicable) are obtained utilizing NASCET criteria, using the distal internal carotid diameter as the denominator. CONTRAST:  54m OMNIPAQUE IOHEXOL 350 MG/ML SOLN COMPARISON:  Brain MRI earlier today. Brain MRI and intracranial MRA 04/25/2020. chest CT 04/10/2020. FINDINGS: CT HEAD Brain: No intracranial hemorrhage is identified. Largely unremarkable noncontrast CT appearance of the basilar cisterns. No posterior fossa encephalomalacia identified on the right although overlying previous right suboccipital craniectomy (series 5, image 9). Conspicuous vascular calcification at the left MCA trifurcation (series 5, image 15 and series 8, image 25), although no other intracranial calcified plaque is evident. No ventriculomegaly. No cortically based acute infarct identified. Gray-white matter differentiation within normal limits. Calvarium and skull base: Previous right suboccipital craniectomy. No acute osseous abnormality identified. Paranasal sinuses: Visualized paranasal sinuses and mastoids are stable and well pneumatized. Orbits: No acute orbit or scalp soft tissue finding. CTA NECK Skeleton: Carious posterior dentition on the left. Benign-appearing C6 vertebral body hemangioma. No acute or suspicious osseous lesion identified. Upper chest: Mediastinal and hilar lymphadenopathy redemonstrated. Visible lungs are  clear. Other neck: Negative.  No cervical lymphadenopathy. Aortic arch: 3 vessel arch configuration.  No arch atherosclerosis. Right carotid system: Negative. Left carotid system: Negative. Vertebral arteries: Negative.  The left vertebral artery is mildly dominant. CTA HEAD Posterior circulation: Distal vertebral arteries are patent, with mild irregularity of the distal right V4 segment but no distal vertebral stenosis. Normal vertebrobasilar junction. Both PICA origins are patent and within normal limits. Patent basilar artery with mild tortuosity. No basilar stenosis. SCA and PCA origins are normal. Small posterior communicating arteries. Left PCA branches are within normal limits. Right PCA branches are mildly irregular (series 14, image 21). 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 Anterior circulation: Both ICA siphons are patent with no plaque or stenosis identified. Normal ophthalmic and posterior communicating artery origins. Patent carotid termini. Patent MCA and ACA origins. Anterior communicating artery and bilateral ACA branches are within normal limits. Left MCA M1 segment is within normal limits. 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 The vessel enlargement is more fusiform and saccular, although there does appear to be a subtle saccular component directed anteriorly and superiorly on series 13, image 151. The origin of all M2 branches are incorporated. Elsewhere the dominant posterior M2 to M3 branching also appears irregular on series 14, image 32. And other left MCA branches are also irregular on series 14, image 13. 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 Right MCA bifurcation is patent without stenosis. Right  MCA branches appear more normal than those on the left side (series 14, image 16). Venous sinuses: Patent. The vein of Galen  and the inferior sagittal sinus do appear mildly irregular. But there is no venous sinus filling defect identified. Anatomic variants: Mildly dominant left vertebral artery. Review of the MIP images confirms the above findings IMPRESSION: 1. Positive for intracranial vessel irregularity, most pronounced at the the left MCA and right PCA branches, suspicious for Acute Vasculitis. 2. 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 A conspicuous vessel calcification is noted there, with no superimposed atherosclerosis elsewhere in the head and neck. 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 3. 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 No new abnormality of the brain parenchyma identified by CT. The patient has previously had a right suboccipital craniotomy of unclear significance. 4. Mediastinal and hilar lymphadenopathy again noted, recently biopsied by bronchoscopy. Electronically Signed   By: Genevie Ann M.D.   On: 05/01/2020 22:48   DG Chest 2 View  Result Date: 05/01/2020 CLINICAL DATA:  Mediastinal lymphadenopathy EXAM: CHEST - 2 VIEW COMPARISON:  Chest CT 04/27/2020 FINDINGS: Stable cardiac silhouette. Again demonstrated bilateral prominence of the hilar structures consistent with hilar lymphadenopathy. No interval change. No lung parenchymal abnormality identified. IMPRESSION: Persistent bilateral hilar lymphadenopathy. Common differential would include sarcoidosis versus lymphoma. Electronically Signed   By: Suzy Bouchard M.D.   On: 05/01/2020 11:00   MR BRAIN W WO CONTRAST  Result Date: 05/01/2020 CLINICAL DATA:  Brain mass or lesion; follow up. EXAM: MRI HEAD WITHOUT AND WITH CONTRAST TECHNIQUE: Multiplanar, multiecho pulse sequences of the brain and surrounding structures were obtained without and  with intravenous contrast. CONTRAST:  7.47m GADAVIST GADOBUTROL 1 MMOL/ML IV SOLN COMPARISON:  MRI of the brain April 25 2020 FINDINGS: Brain: No acute infarction, hemorrhage, hydrocephalus or mass lesion. No significant interval change of the leptomeningeal thickening and contrast enhancement along the bilateral cerebellar sulci, brainstem surface predominantly on the right side and more pronounced on the right, extending into the right internal auditory canal. Small collection in the right cerebellopontine angle cistern, measuring approximately 1.3 x 0.5 mm, unchanged from prior. 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 Vascular: Flow voids are preserved. Ectatic left MCA/M2 posterior division branch (series 6, images 12 and 13). Given ongoing infectious process, evaluation with CT angiogram is suggested to exclude mycotic aneurysm. Skull and upper cervical spine: Normal marrow signal. Sinuses/Orbits: Negative. Other: Small left mastoid effusion. IMPRESSION: 1. No significant interval change of the basal leptomeningeal process with small collection in the right cerebellopontine angle and associated right middle cerebellar peduncle cerebritis. 2. Ectatic left MCA/M2 posterior division branch. Given ongoing infectious process, evaluation with CT angiogram is suggested to exclude mycotic aneurysm. These results will be called to the ordering clinician or representative by the Radiologist Assistant, and communication documented in the PACS or [employer] Electronically Signed   By: KPedro EarlsM.D.   On: 05/01/2020 11:07   ECHOCARDIOGRAM COMPLETE  Result Date: 05/01/2020    ECHOCARDIOGRAM REPORT   Patient Name:   RKHYLER ESCHMANNDate of Exam: 05/01/2020 Medical Rec #:  [id number]     Height:       73.0 in Accession #:    [id number]     Weight:       173.5 lb Date of Birth:  [birth year]     BSA:          2.026 m Patient Age:  44 years       BP:           119/76 mmHg Patient Gender: M              HR:           105 bpm. Exam Location:  Inpatient Procedure: 2D Echo Indications:    Endocarditis I38  History:        Patient has no prior history of Echocardiogram examinations.  Sonographer:    Mikki Santee RDCS (AE) Referring Phys: [id number] Marchello Rothgeb CHIRAG Remi Lop[place] IMPRESSIONS  1. Left ventricular ejection fraction, by estimation, is 50 to 55%. The left ventricle has low normal function. The left ventricle has no regional wall motion abnormalities. Left ventricular diastolic parameters were normal.  2. Right ventricular systolic function is normal. The right ventricular size is normal.  3. The mitral valve is normal in structure. Trivial mitral valve regurgitation. No evidence of mitral stenosis.  4. The aortic valve has an indeterminant number of cusps. Aortic valve regurgitation is not visualized. No aortic stenosis is present.  5. The inferior vena cava is normal in size with greater than 50% respiratory variability, suggesting right atrial pressure of 3 mmHg. FINDINGS  Left Ventricle: Left ventricular ejection fraction, by estimation, is 50 to 55%. The left ventricle has low normal function. The left ventricle has no regional wall motion abnormalities. The left ventricular internal cavity size was normal in size. There is no left ventricular hypertrophy. Left ventricular diastolic parameters were normal. Right Ventricle: The right ventricular size is normal. Right ventricular systolic function is normal. Left Atrium: Left atrial size was normal in size. Right Atrium: Right atrial size was normal in size. Pericardium: There is no evidence of pericardial effusion. Mitral Valve: The mitral valve is normal in structure. Trivial mitral valve regurgitation. No evidence of mitral valve stenosis. Tricuspid Valve: The tricuspid valve is normal in structure.  Tricuspid valve regurgitation is trivial. No evidence of tricuspid stenosis. Aortic Valve: The aortic valve has an indeterminant number of cusps. Aortic valve regurgitation is not visualized. No aortic stenosis is present. Pulmonic Valve: The pulmonic valve was not well visualized. Pulmonic valve regurgitation is not visualized. No evidence of pulmonic stenosis. Aorta: The aortic root is normal in size and structure. Venous: The inferior vena cava is normal in size with greater than 50% respiratory variability, suggesting right atrial pressure of 3 mmHg. IAS/Shunts: No atrial level shunt detected by color flow Doppler.  LEFT VENTRICLE PLAX 2D LVIDd:         3.80 cm  Diastology LVIDs:         2.95 cm  LV e' medial:  8.38 cm/s LV PW:         1.10 cm  LV e' lateral: 8.59 cm/s LV IVS:        1.10 cm LVOT diam:     2.30 cm LV SV:         51 LV SV Index:   25 LVOT Area:     4.15 cm  RIGHT VENTRICLE RV S prime:     12.60 cm/s LEFT ATRIUM             Index       RIGHT ATRIUM          Index LA diam:        2.80 cm 1.38 cm/m  RA Area:     8.59 cm LA Vol (A2C):   40.3 ml 19.90 ml/m RA Volume:  14.50 ml 7.16 ml/m LA Vol (A4C):   36.3 ml 17.92 ml/m LA Biplane Vol: 38.9 ml 19.21 ml/m  AORTIC VALVE LVOT Vmax:   87.20 cm/s LVOT Vmean:  56.300 cm/s LVOT VTI:    0.122 m  AORTA Ao Root diam: 3.20 cm  SHUNTS Systemic VTI:  0.12 m Systemic Diam: 2.30 cm Kirk Ruths MD Electronically signed by Kirk Ruths MD Signature Date/Time: 05/01/2020/2:11:41 PM    Final    CT ANGIO NECK CODE STROKE  Result Date: 05/01/2020 CLINICAL DATA:  44 year old male with abnormal MRIs recently suspicious for basilar meningitis, cerebritis. Ectatic left MCA M2 posterior branch, query mycotic aneurysm. Status post COVID-19 1 month ago. Status post bronchoscopy with bronchoscopic biopsy of mediastinal lymphadenopathy yesterday. Pathology results pending. EXAM: CT ANGIOGRAPHY HEAD AND NECK TECHNIQUE: Multidetector CT imaging of the head and neck  was performed using the standard protocol during bolus administration of intravenous contrast. Multiplanar CT image reconstructions and MIPs were obtained to evaluate the vascular anatomy. Carotid stenosis measurements (when applicable) are obtained utilizing NASCET criteria, using the distal internal carotid diameter as the denominator. CONTRAST:  47m OMNIPAQUE IOHEXOL 350 MG/ML SOLN COMPARISON:  Brain MRI earlier today. Brain MRI and intracranial MRA 04/25/2020. chest CT 04/10/2020. FINDINGS: CT HEAD Brain: No intracranial hemorrhage is identified. Largely unremarkable noncontrast CT appearance of the basilar cisterns. No posterior fossa encephalomalacia identified on the right although overlying previous right suboccipital craniectomy (series 5, image 9). Conspicuous vascular calcification at the left MCA trifurcation (series 5, image 15 and series 8, image 25), although no other intracranial calcified plaque is evident. No ventriculomegaly. No cortically based acute infarct identified. Gray-white matter differentiation within normal limits. Calvarium and skull base: Previous right suboccipital craniectomy. No acute osseous abnormality identified. Paranasal sinuses: Visualized paranasal sinuses and mastoids are stable and well pneumatized. Orbits: No acute orbit or scalp soft tissue finding. CTA NECK Skeleton: Carious posterior dentition on the left. Benign-appearing C6 vertebral body hemangioma. No acute or suspicious osseous lesion identified. Upper chest: Mediastinal and hilar lymphadenopathy redemonstrated. Visible lungs are clear. Other neck: Negative.  No cervical lymphadenopathy. Aortic arch: 3 vessel arch configuration.  No arch atherosclerosis. Right carotid system: Negative. Left carotid system: Negative. Vertebral arteries: Negative.  The left vertebral artery is mildly dominant. CTA HEAD Posterior circulation: Distal vertebral arteries are patent, with mild irregularity of the distal right V4 segment  but no distal vertebral stenosis. Normal vertebrobasilar junction. Both PICA origins are patent and within normal limits. Patent basilar artery with mild tortuosity. No basilar stenosis. SCA and PCA origins are normal. Small posterior communicating arteries. Left PCA branches are within normal limits. Right PCA branches are mildly irregular (series 14, image 21). 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 Anterior circulation: Both ICA siphons are patent with no plaque or stenosis identified. Normal ophthalmic and posterior communicating artery origins. Patent carotid termini. Patent MCA and ACA origins. Anterior communicating artery and bilateral ACA branches are within normal limits. Left MCA M1 segment is within normal limits. 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 The vessel enlargement is more fusiform and saccular, although there does appear to be a subtle saccular component directed anteriorly and superiorly on series 13, image 151. The origin of all M2 branches are incorporated. Elsewhere the dominant posterior M2 to M3 branching also appears irregular on series 14, image 32. And other  left MCA branches are also irregular on series 14, image 13. 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 Right MCA bifurcation is patent without stenosis. Right MCA branches appear more normal than those on the left side (series 14, image 16). Venous sinuses: Patent. The vein of Galen and the inferior sagittal sinus do appear mildly irregular. But there is no venous sinus filling defect identified. Anatomic variants: Mildly dominant left vertebral artery. Review of the MIP images confirms the above findings IMPRESSION: 1. Positive for intracranial  vessel irregularity, most pronounced at the the left MCA and right PCA branches, suspicious for Acute Vasculitis. 2. 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 A conspicuous vessel calcification is noted there, with no superimposed atherosclerosis elsewhere in the head and neck. 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 3. 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 No new abnormality of the brain parenchyma identified by CT. The patient has previously had a right suboccipital craniotomy of unclear significance. 4. Mediastinal and hilar lymphadenopathy again noted, recently biopsied by bronchoscopy. Electronically Signed   By: Genevie Ann M.D.   On: 05/01/2020 22:48         Scheduled Meds:  dexamethasone (DECADRON) injection  8 mg Intravenous Q6H   heparin  5,000 Units Subcutaneous Q8H   senna-docusate  2 tablet Oral QHS   Continuous Infusions:  sodium chloride 125 mL/hr at 05/03/20 0211   lactated ringers Stopped (04/30/20 1128)     LOS: 7 days   Time spent= 35 mins    Mabrey Howland Arsenio Loader, MD [facility]  If 7PM-7AM, please contact night-coverage  05/03/2020, 8:08 AM

## 2020-05-04 ENCOUNTER — Inpatient Hospital Stay: Payer: Self-pay | Admitting: Infectious Diseases

## 2020-05-04 ENCOUNTER — Inpatient Hospital Stay (HOSPITAL_COMMUNITY): Payer: Self-pay

## 2020-05-04 HISTORY — PX: IR ANGIO VERTEBRAL SEL VERTEBRAL UNI R MOD SED: IMG5368

## 2020-05-04 HISTORY — PX: IR US GUIDE VASC ACCESS RIGHT: IMG2390

## 2020-05-04 LAB — CBC
HCT: 36.2 % — ABNORMAL LOW (ref 39.0–52.0)
Hemoglobin: 11.9 g/dL — ABNORMAL LOW (ref 13.0–17.0)
MCH: 29.8 pg (ref 26.0–34.0)
MCHC: 32.9 g/dL (ref 30.0–36.0)
MCV: 90.7 fL (ref 80.0–100.0)
Platelets: 203 10*3/uL (ref 150–400)
RBC: 3.99 MIL/uL — ABNORMAL LOW (ref 4.22–5.81)
RDW: 12.7 % (ref 11.5–15.5)
WBC: 4.4 10*3/uL (ref 4.0–10.5)
nRBC: 0 % (ref 0.0–0.2)

## 2020-05-04 LAB — BASIC METABOLIC PANEL WITH GFR
Anion gap: 7 (ref 5–15)
BUN: 5 mg/dL — ABNORMAL LOW (ref 6–20)
CO2: 25 mmol/L (ref 22–32)
Calcium: 8.9 mg/dL (ref 8.9–10.3)
Chloride: 104 mmol/L (ref 98–111)
Creatinine, Ser: 0.65 mg/dL (ref 0.61–1.24)
GFR calc Af Amer: >60 mL/min
GFR calc non Af Amer: >60 mL/min
Glucose, Bld: 117 mg/dL — ABNORMAL HIGH (ref 70–99)
Potassium: 4.4 mmol/L (ref 3.5–5.1)
Sodium: 136 mmol/L (ref 135–145)

## 2020-05-04 LAB — MAGNESIUM: Magnesium: 1.9 mg/dL (ref 1.7–2.4)

## 2020-05-04 LAB — PROTIME-INR
INR: 1 (ref 0.8–1.2)
Prothrombin Time: 12.8 seconds (ref 11.4–15.2)

## 2020-05-04 IMAGING — US IR US GUIDE VASC ACCESS RIGHT
1 series · 3 of 3 positions shown · non-contrast
Comparison: MRA of the brain [DATE].

CLINICAL DATA: Headaches. Abnormal CT angiogram and MRA of the
brain suggestive of mycotic aneurysm.

EXAM:
IR ANGIO VERTEBRAL SEL VERTEBRAL UNI RIGHT MOD SED
TECHNIQUE: Informed written consent was obtained from the patient after a
thorough discussion of the procedural risks, benefits and
alternatives. All questions were addressed. Maximal Sterile Barrier
Technique was utilized including caps, mask, sterile gowns, sterile
gloves, sterile drape, hand hygiene and skin antiseptic. A timeout
was performed prior to the initiation of the procedure.

[Series 1: (id) · 3 of 3 slices shown]
[im 1/3]
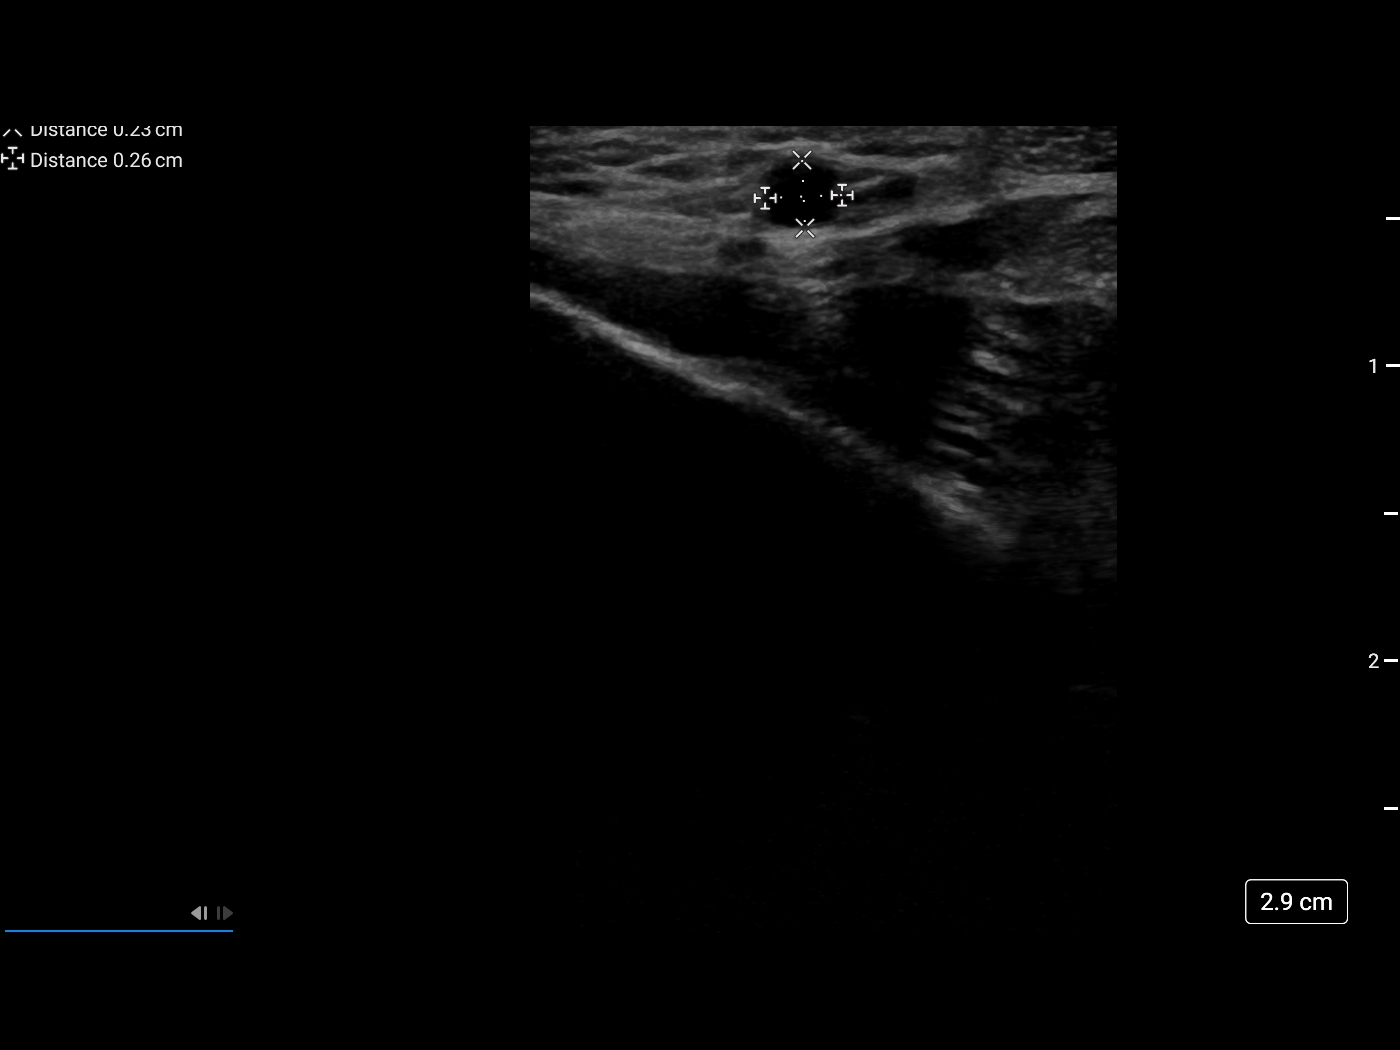
[im 2/3]
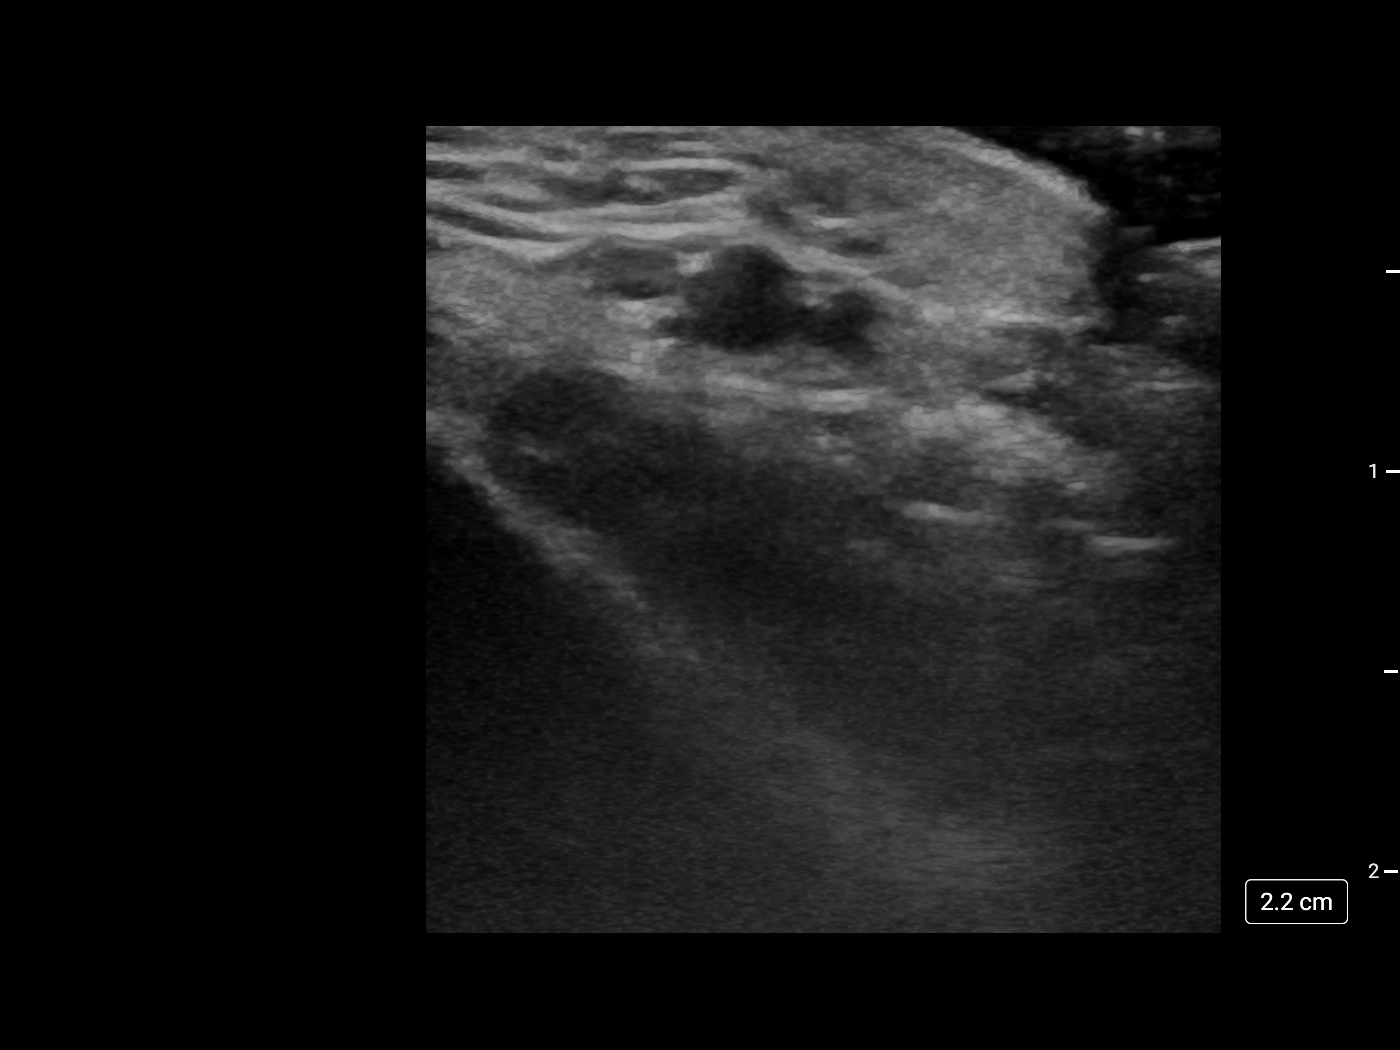
[im 3/3]
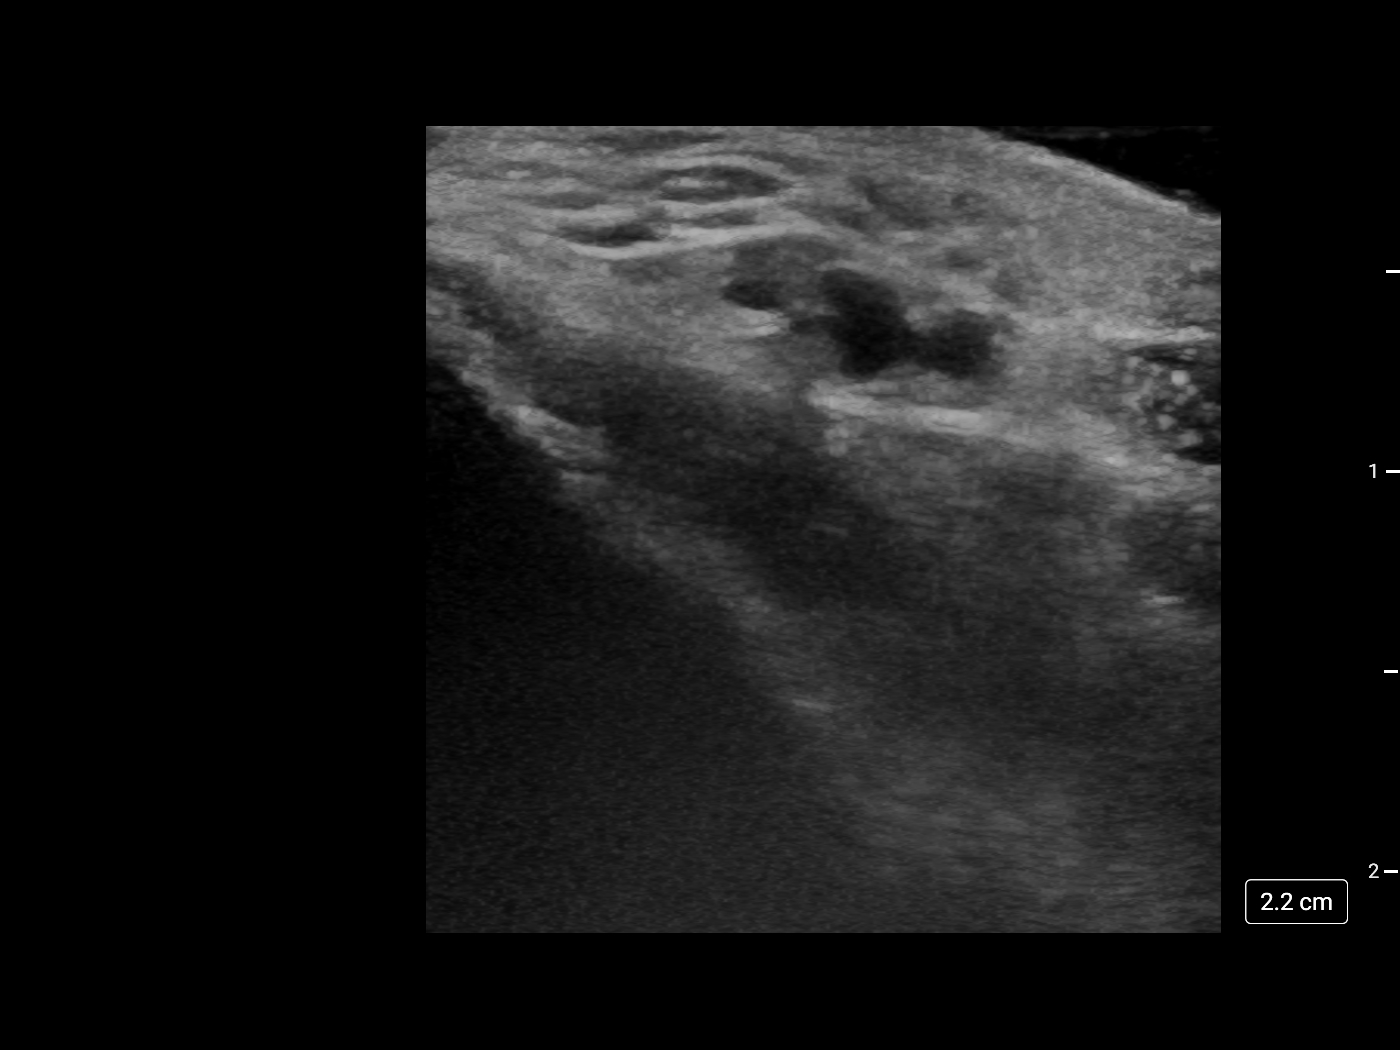

[3 of 3 positions shown; findings below may reference images not displayed]

CT angiogram of
the head and neck [DATE].

MEDICATIONS:
Heparin [S8] units IV; no antibiotic was administered within 1 hour
of the procedure.

ANESTHESIA/SEDATION:
Versed 1 mg IV; Fentanyl 25 mcg IV

Moderate Sedation Time:  39 minutes

The patient was continuously monitored during the procedure by the
interventional radiology nurse under my direct supervision.

CONTRAST:  Isovue 300 approximately 25 cc

FLUOROSCOPY TIME:  Fluoroscopy Time: 6 minutes 12 seconds (355 mGy).

COMPLICATIONS:
None immediate.
The right forearm to the wrist was prepped and draped the usual
sterile manner.

The right radial artery was then identified with ultrasound, and its
morphology documented. A dorsal palmar anastomosis was verified to
be present. Using ultrasound guidance and micropuncture set access
into the right radial artery was obtained over a 0.18 inch micro
guidewire. Over the micro guidewire, a [DATE] French radial sheath was
then inserted.

The obturator, and micro guidewire were removed. Good aspiration was
obtained from the side port of the radial sheath. A cocktail of [S8]
units of heparin, and 2.5 mg of verapamil, and 200 mcg of
nitroglycerin was then infused in diluted form through the sheath
without event. A right radial arteriogram was obtained. Over a
inch Roadrunner guidewire, WUCHER 3 diagnostic catheter was then
advanced to the aortic arch region and selectively positioned in the
right vertebral artery.

An arteriogram was then performed.

Following the procedure hemostasis at the right radial puncture site
was achieved with a wrist band. Distal right radial pulse was
verified to be present.
FINDINGS: The origin of the right vertebral artery demonstrates wide patency.
The vessel is seen to opacify normally to the cranial skull base.
Patency is maintained of the right vertebrobasilar junction and the
right posterior-inferior cerebellar artery. The opacified portion of
the basilar artery, the posterior cerebral arteries, the superior
cerebellar arteries and the anterior-inferior cerebellar arteries
opacify into the capillary and venous phases.
IMPRESSION: Angiographically no evidence of occlusions, stenosis, dissections or
of aneurysms.

PLAN:
Follow-up in 1 year with CT angiogram of the head and neck.

## 2020-05-04 MED ORDER — FENTANYL CITRATE (PF) 100 MCG/2ML IJ SOLN
INTRAMUSCULAR | Status: AC | PRN
  Administered 2020-05-04: 25 ug via INTRAVENOUS

## 2020-05-04 MED ORDER — LIDOCAINE HCL 1 % IJ SOLN
INTRAMUSCULAR | Status: AC | PRN
  Administered 2020-05-04: 10 mL

## 2020-05-04 MED ORDER — PANTOPRAZOLE SODIUM 40 MG PO TBEC
40.0000 mg | DELAYED_RELEASE_TABLET | Freq: Every day | ORAL | Status: DC
  Administered 2020-05-05: 40 mg via ORAL
  Filled 2020-05-04 (×2): qty 1

## 2020-05-04 MED ORDER — FENTANYL CITRATE (PF) 100 MCG/2ML IJ SOLN
INTRAMUSCULAR | Status: AC
Start: 2020-05-04 — End: 2020-05-04
  Filled 2020-05-04: qty 2

## 2020-05-04 MED ORDER — LIDOCAINE HCL 1 % IJ SOLN
INTRAMUSCULAR | Status: AC
  Filled 2020-05-04: qty 20

## 2020-05-04 MED ORDER — SODIUM CHLORIDE 0.9 % IV SOLN: INTRAVENOUS | Status: DC

## 2020-05-04 MED ORDER — HEPARIN SODIUM (PORCINE) 1000 UNIT/ML IJ SOLN
INTRAMUSCULAR | Status: AC
  Filled 2020-05-04: qty 1

## 2020-05-04 MED ORDER — MIDAZOLAM HCL 2 MG/2ML IJ SOLN
INTRAMUSCULAR | Status: AC | PRN
  Administered 2020-05-04: 1 mg via INTRAVENOUS

## 2020-05-04 MED ORDER — DEXAMETHASONE 4 MG PO TABS
6.0000 mg | ORAL_TABLET | Freq: Three times a day (TID) | ORAL | Status: DC
  Administered 2020-05-04 – 2020-05-05 (×3): 6 mg via ORAL
  Filled 2020-05-04 (×4): qty 1

## 2020-05-04 MED ORDER — VERAPAMIL HCL 2.5 MG/ML IV SOLN
INTRAVENOUS | Status: AC
  Filled 2020-05-04: qty 2

## 2020-05-04 MED ORDER — VERAPAMIL HCL 2.5 MG/ML IV SOLN
INTRAVENOUS | Status: AC | PRN
  Administered 2020-05-04: 2.5 mg via INTRAVENOUS

## 2020-05-04 MED ORDER — MIDAZOLAM HCL 2 MG/2ML IJ SOLN
INTRAMUSCULAR | Status: AC
  Filled 2020-05-04: qty 2

## 2020-05-04 MED ORDER — HEPARIN SODIUM (PORCINE) 1000 UNIT/ML IJ SOLN
INTRAMUSCULAR | Status: AC | PRN
  Administered 2020-05-04: 2000 [IU] via INTRA_ARTERIAL

## 2020-05-04 MED ORDER — NITROGLYCERIN 1 MG/10 ML FOR IR/CATH LAB
INTRA_ARTERIAL | Status: AC
  Filled 2020-05-04: qty 10

## 2020-05-04 MED ORDER — IOHEXOL 300 MG/ML  SOLN
150.0000 mL | Freq: Once | INTRAMUSCULAR | Status: AC | PRN
  Administered 2020-05-04: 25 mL via INTRA_ARTERIAL

## 2020-05-04 MED ORDER — NITROGLYCERIN 1 MG/10 ML FOR IR/CATH LAB
INTRA_ARTERIAL | Status: AC | PRN
  Administered 2020-05-04: 200 ug via INTRA_ARTERIAL

## 2020-05-04 NOTE — Progress Notes (Signed)
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  Outpatient he had received a course of steroids and completed course of Augmentin.  Due to persistent fever had CT chest which showed right lower lobe nodule, left lower lobe nodule and bilateral hilar lymphadenopathy.  Some focal sclerosing lesion in the L1 vertebral body.  Later also developed truncal and bilateral upper and lower extremity rash.  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  ID consulted and started on steroids and RIPE therapy.  Pulmonary team was consulted and underwent bronchoscopy/EBUS on 9/30.  Thereafter spiked fever overnight.  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  Case discussed with neuro interventional and neurology who recommended cerebral angiogram.  Cerebral angiogram did not show any aneurysm or occlusion.  ID has already signed off did not recommend any antibiotics.  Current plan is to continue steroids and follow-up with neurology,Dr Felecia Shelling, as an outpatient.  Physical therapy consulted. Plan for discharge tomorrow back to home with oral steroids.  Assessment & Plan:   Active Problems:   Fever and chills   Bilateral headaches   Meningitis   Recurrent fevers with headache/suspicion for meningitis/hilar lymphadenopathy/pulmonary nodules/neurosarcoidosis: Presented with recurrent fever, headache from home.  CT imaging showed pulmonary nodules, hilar lymphadenopathy.  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  ID was consulted and started on steroids and antibiotics.  Repeat MRI of the brain showed concern for mycotic  aneurysm and CT head and neck showed possible vasculitis.  Neuro intervention radiology did cerebral angiogram today which did not show any evidence of occlusion or aneurysm. ID was closely following.  Antibiotics have been discontinued.  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  Case has been discussed with neurology who recommend to continue steroids and follow-up with neurology, Dr. Felecia Shelling, as an outpatient. Blood cultures have been negative. We will change Decadron to oral on discharge.  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  CSF studies for AFB is negative.  HIV negative.  QuantiFERON test negative.  CT chest showed multifocal mediastinal/hilar adenopathy, calcification of right hilar nodules, granulomatous disease.  He status post bronc/EBUS by pulmonology which did not show malignant cells as per biopsy.  He needs to follow-up with pulmonology as an outpatient.  Echocardiogram were unremarkable.           DVT prophylaxis:Heparin  Code Status: Full Family Communication: None at bedside Status is: Inpatient  Remains inpatient appropriate because:Unsafe d/c plan   Dispo: The patient is from: Home              Anticipated d/c is to: Home              Anticipated d/c date is: 1 day              Patient currently is not medically stable to d/c.Needs PT evaluation for safe DC      Consultants: Neurology, PCCM, neuroradiology  Procedures: Lumbar puncture, cerebral angiogram  Antimicrobials:  Anti-infectives (From admission,  onward)   Start     Dose/Rate Route Frequency Ordered Stop   04/27/20 1000  ethambutol (MYAMBUTOL) tablet 1,600 mg  Status:  Discontinued        1,600 mg Oral Daily 04/27/20 0949 05/01/20 1237   04/26/20 1000  rifampin (RIFADIN) capsule 600 mg  Status:  Discontinued        600 mg Oral Daily 04/26/20  0856 05/01/20 1237   04/26/20 1000  isoniazid (NYDRAZID) tablet 300 mg  Status:  Discontinued        300 mg Oral Daily 04/26/20 0856 05/01/20 1237   04/26/20 1000  pyrazinamide tablet 2,000 mg  Status:  Discontinued        2,000 mg Oral Daily 04/26/20 0856 05/01/20 1237   04/26/20 1000  ethambutol (MYAMBUTOL) tablet 1,500 mg  Status:  Discontinued        1,500 mg Oral Daily 04/26/20 0856 04/27/20 0949   04/26/20 0700  vancomycin (VANCOCIN) IVPB 1000 mg/200 mL premix  Status:  Discontinued        1,000 mg 200 mL/hr over 60 Minutes Intravenous Every 8 hours 04/25/20 2224 04/27/20 0817   04/26/20 0000  acyclovir (ZOVIRAX) 860 mg in dextrose 5 % 150 mL IVPB        10 mg/kg  86.2 kg 167.2 mL/hr over 60 Minutes Intravenous  Once 04/25/20 2345 04/26/20 0843   04/25/20 2230  vancomycin (VANCOREADY) IVPB 1750 mg/350 mL        1,750 mg 175 mL/hr over 120 Minutes Intravenous  Once 04/25/20 2222 04/26/20 0344   04/25/20 2215  vancomycin (VANCOCIN) IVPB 1000 mg/200 mL premix  Status:  Discontinued        1,000 mg 200 mL/hr over 60 Minutes Intravenous  Once 04/25/20 2203 04/25/20 2222   04/25/20 2215  cefTRIAXone (ROCEPHIN) 2 g in sodium chloride 0.9 % 100 mL IVPB  Status:  Discontinued        2 g 200 mL/hr over 30 Minutes Intravenous Every 12 hours 04/25/20 2203 04/27/20 0817      Subjective: Patient seen and examined at interventional radiology department this afternoon.  He was very comfortable during my evaluation.  Denies any complaints.  Hispanic interpreter was used with the help of patient's mobile phone for communication.  We discussed about possible discharge planning tomorrow.  Answered all his questions.  Objective: Vitals:   05/04/20 1055 05/04/20 1100 05/04/20 1200 05/04/20 1300  BP: 118/85 127/82 119/85 112/90  Pulse: 75 72 77 76  Resp: _0 Temp:      TempSrc:      SpO2: 98% 98% 98% 100%  Weight:      Height:        Intake/Output Summary (Last 24 hours) at 05/04/2020  1313 Last data filed at 05/04/2020 0934 Gross per 24 hour  Intake 795 ml  Output 2100 ml  Net -1305 ml   Filed Weights   04/30/20 2013 05/01/20 2116 05/02/20 2112  Weight: 78.7 kg 79.2 kg 79.2 kg    Examination:  General exam: Appears calm and comfortable ,Not in distress,average built HEENT:PERRL,Oral mucosa moist, Ear/Nose normal on gross exam Respiratory system: Bilateral equal air entry, normal vesicular breath sounds, no wheezes or crackles  Cardiovascular system: S1 & S2 heard, RRR. No JVD, murmurs, rubs, gallops or clicks. No pedal edema. Gastrointestinal system: Abdomen is nondistended, soft and nontender. No organomegaly or masses felt. Normal bowel sounds heard. Central nervous system: Alert and oriented. No focal neurological deficits.  Extremities: No edema, no clubbing ,no cyanosis, distal peripheral pulses palpable. Skin: No rashes, lesions or ulcers,no icterus ,no pallor   Data Reviewed: I have personally reviewed following labs and imaging studies  CBC: Recent Labs  Lab 05/01/20 0806 05/02/20 0509 05/03/20 0201 05/04/20 0443  WBC 5.2 5.3 3.5* 4.4  HGB 13.9 12.6* 11.6* 11.9*  HCT 40.7 37.4* 34.7* 36.2*  MCV 90.2 89.9 89.9 90.7  PLT 244 212 186 116   Basic Metabolic Panel: Recent Labs  Lab 04/29/20 1122 04/29/20 1122 04/30/20 0852 05/01/20 0806 05/02/20 0509 05/03/20 0201 05/04/20 0443  NA 132*  --   --  132* 133* 132* 136  K 4.5  --   --  4.8 4.1 4.0 4.4  CL 96*  --   --  97* 101 102 104  CO2 27  --   --  _0 GLUCOSE 110*  --   --  105* 108* 133* 117*  BUN 9  --   --  _1 5*  CREATININE 0.91  --   --  0.77 0.78 0.65 0.65  CALCIUM 9.5  --   --  8.8* 8.7* 8.6* 8.9  MG 2.0   < > 2.0 2.0 2.0 1.8 1.9   < > = values in this interval not displayed.   GFR: Estimated Creatinine Clearance: 129.3 mL/min (by C-G formula based on SCr of 0.65 mg/dL). Liver Function Tests: Recent Labs  Lab 04/28/20 0736 04/29/20 1122  AST 12* 13*  ALT 23 25   ALKPHOS 39 36*  BILITOT 1.0 1.0  PROT 6.6 6.8  ALBUMIN 3.5 3.6   No results for input(s): LIPASE, AMYLASE in the last 168 hours. No results for input(s): AMMONIA in the last 168 hours. Coagulation Profile: Recent Labs  Lab 04/30/20 0852 05/04/20 0443  INR 1.1 1.0   Cardiac Enzymes: No results for input(s): CKTOTAL, CKMB, CKMBINDEX, TROPONINI in the last 168 hours. BNP (last 3 results) No results for input(s): PROBNP in the last 8760 hours. HbA1C: No results for input(s): HGBA1C in the last 72 hours. CBG: No results for input(s): GLUCAP in the last 168 hours. Lipid Profile: No results for input(s): CHOL, HDL, LDLCALC, TRIG, CHOLHDL, LDLDIRECT in the last 72 hours. Thyroid Function Tests: No results for input(s): TSH, T4TOTAL, FREET4, T3FREE, THYROIDAB in the last 72 hours. Anemia Panel: No results for input(s): VITAMINB12, FOLATE, FERRITIN, TIBC, IRON, RETICCTPCT in the last 72 hours. Sepsis Labs: No results for input(s): PROCALCITON, LATICACIDVEN in the last 168 hours.  Recent Results (from the past 240 hour(s))  CSF culture     Status: None   Collection Time: 04/25/20  4:50 PM   Specimen: CSF; Cerebrospinal Fluid  Result Value Ref Range Status   Specimen Description CSF  Final   Special Requests NONE  Final   Gram Stain   Final    ABUNDANT WBC PRESENT,BOTH PMN AND MONONUCLEAR NO ORGANISMS SEEN    Culture   Final    NO GROWTH Performed at [facility], [address], [place], [place] [address]    Report Status 04/29/2020 FINAL  Final  Culture, fungus without smear     Status: None (Preliminary result)   Collection Time: 04/25/20  4:50 PM   Specimen: CSF; Cerebrospinal Fluid  Result Value Ref Range Status   Specimen Description CSF  Final   Special Requests NONE  Final   Culture   Final    NO FUNGUS ISOLATED AFTER 7 DAYS  Performed at [facility], [place] [address], [place], [place] [address]    Report Status PENDING  Incomplete  Anaerobic culture      Status: None   Collection Time: 04/25/20  8:00 PM   Specimen: CSF; Cerebrospinal Fluid  Result Value Ref Range Status   Specimen Description CSF TUBE 3  Final   Special Requests NONE  Final   Gram Stain   Final    ABUNDANT WBC PRESENT,BOTH PMN AND MONONUCLEAR NO ORGANISMS SEEN    Culture   Final    NO ANAEROBES ISOLATED Performed at [facility], [place] [address], [place], [place] [address]    Report Status 05/02/2020 FINAL  Final  Culture, blood (routine x 2)     Status: None   Collection Time: 04/26/20 12:59 AM   Specimen: BLOOD  Result Value Ref Range Status   Specimen Description BLOOD RIGHT ARM  Final   Special Requests   Final    BOTTLES DRAWN AEROBIC AND ANAEROBIC Blood Culture adequate volume   Culture   Final    NO GROWTH 5 DAYS Performed at [facility], [address] [place], [place] [address]    Report Status 05/01/2020 FINAL  Final  Culture, blood (routine x 2)     Status: None   Collection Time: 04/26/20  1:04 AM   Specimen: BLOOD  Result Value Ref Range Status   Specimen Description BLOOD RIGHT HAND  Final   Special Requests   Final    BOTTLES DRAWN AEROBIC AND ANAEROBIC Blood Culture adequate volume   Culture   Final    NO GROWTH 5 DAYS Performed at [facility], [place] [address] [place], [place] [address]    Report Status 05/01/2020 FINAL  Final  Acid Fast Smear (AFB)     Status: None   Collection Time: 04/30/20 10:13 AM   Specimen: Bronchial Alveolar Lavage; Respiratory  Result Value Ref Range Status   AFB Specimen Processing Concentration  Final   Acid Fast Smear Negative  Final    Comment: (NOTE) Performed At: [facility] [place], [place] [id number] Rush Farmer MD NG:[id number]    Source (AFB) BRONCHIAL ALVEOLAR LAVAGE  Final    Comment: Performed at [facility], [place] [address], [place], [place] [address]  Aerobic/Anaerobic Culture (surgical/deep wound)     Status: None (Preliminary result)     Collection Time: 04/30/20 10:13 AM   Specimen: Bronchoalveolar Lavage  Result Value Ref Range Status   Specimen Description BRONCHIAL ALVEOLAR LAVAGE  Final   Special Requests RIGHT MIDDLE LOBE  Final   Gram Stain   Final    MODERATE WBC PRESENT,BOTH PMN AND MONONUCLEAR NO ORGANISMS SEEN Performed at [facility], [place] [address] [place],  [address]    Culture   Final    RARE Normal respiratory flora-no Staph aureus or Pseudomonas seen NO ANAEROBES ISOLATED; CULTURE IN PROGRESS FOR 5 DAYS    Report Status PENDING  Incomplete         Radiology Studies: No results found.      Scheduled Meds: . dexamethasone (DECADRON) injection  8 mg Intravenous Q6H  . heparin  5,000 Units Subcutaneous Q8H  . lidocaine      . pantoprazole  40 mg Oral Daily  . polyethylene glycol  17 g Oral Daily  . senna-docusate  2 tablet Oral QHS   Continuous Infusions: . sodium chloride Stopped (05/04/20 0758)  .  sodium chloride    . lactated ringers Stopped (04/30/20 1128)     LOS: 8 days    Time spent:25 mins, More than 50% of that time was spent in counseling and/or coordination of care.      Shelly Coss, MD [facility] P10/10/2019, 1:13 PM

## 2020-05-04 NOTE — Sedation Documentation (Signed)
Report given to receiving RN on [facility]. Nurse informed that TR band was removed @ 1240 pm. Patient previous nurse on [facility] also gave report to [facility], hospital. Patient belongings to be transferred to new room by [facility] staff. Translator service used to inform patient of room transfer and updated plan of care. Patient states understanding without any further questions.

## 2020-05-04 NOTE — Progress Notes (Signed)
    [facility]   Reason for visit: Follow up on meningitis  Interval History: no new positive findings.  CTA is negative for an aneurysm.  Patient currently is not in his room.   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  He remains on steroids and will continue with follow up with neurology.   No antibiotics indicated.   Plan: 1.  No changes, follow up as above.  We are available if needed

## 2020-05-04 NOTE — Sedation Documentation (Signed)
Right radial sheath removed, TR band applied with 12cc of air.

## 2020-05-04 NOTE — Procedures (Signed)
S/P RT [facility] angiogram RT Rad approach. Findings. 1.No occlusions a or aneurysms seen. S.Brittyn Salaz MD

## 2020-05-04 NOTE — Progress Notes (Signed)
TR band removed.

## 2020-05-05 ENCOUNTER — Inpatient Hospital Stay (HOSPITAL_COMMUNITY): Payer: Self-pay

## 2020-05-05 HISTORY — PX: IR ANGIO INTRA EXTRACRAN SEL COM CAROTID INNOMINATE BILAT MOD SED: IMG5360

## 2020-05-05 HISTORY — PX: IR 3D INDEPENDENT WKST: IMG2385

## 2020-05-05 HISTORY — PX: IR ANGIO VERTEBRAL SEL VERTEBRAL UNI L MOD SED: IMG5367

## 2020-05-05 LAB — AEROBIC/ANAEROBIC CULTURE W GRAM STAIN (SURGICAL/DEEP WOUND): Culture: NORMAL

## 2020-05-05 LAB — ANGIOTENSIN CONVERTING ENZYME: Angiotensin-Converting Enzyme: 40 U/L (ref 14–82)

## 2020-05-05 IMAGING — XA IR ANGIO INTRA EXTRACRAN SEL COM CAROTID INNOMINATE BILAT MOD SE
2 series · 13 of 24 positions shown · IV contrast (IODINE)
Comparison: MRA of the brain [DATE], and CT
angiogram of the head and neck [DATE].

CLINICAL DATA: Headaches. Abnormal CT angiogram of the head and
neck and MRA of the brain suggestive of a mycotic aneurysm.

EXAM:
BILATERAL COMMON CAROTID AND INNOMINATE ANGIOGRAPHY
TECHNIQUE: Informed written consent was obtained from the patient after a
thorough discussion of the procedural risks, benefits and
alternatives. All questions were addressed. Maximal Sterile Barrier
Technique was utilized including caps, mask, sterile gowns, sterile
gloves, sterile drape, hand hygiene and skin antiseptic. A timeout
was performed prior to the initiation of the procedure.

[Series 9: cerebral · 1 of 2 slices shown]
[im 1/2]
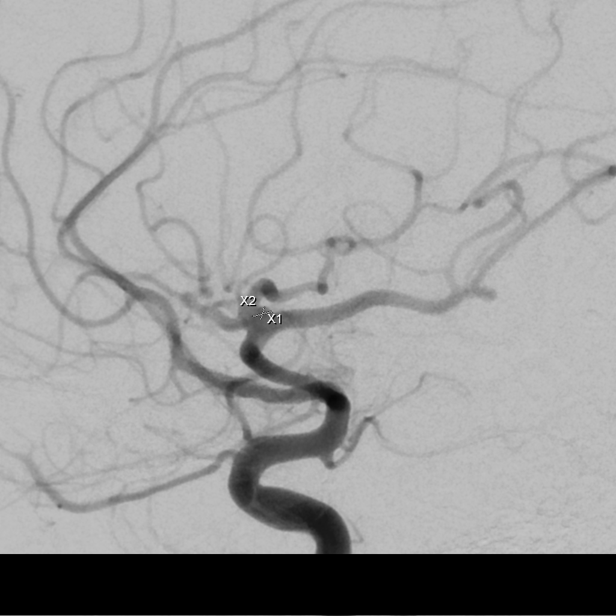

[Series 300: dr. (person_name) · 12 of 166 slices shown]
[im 8/166]
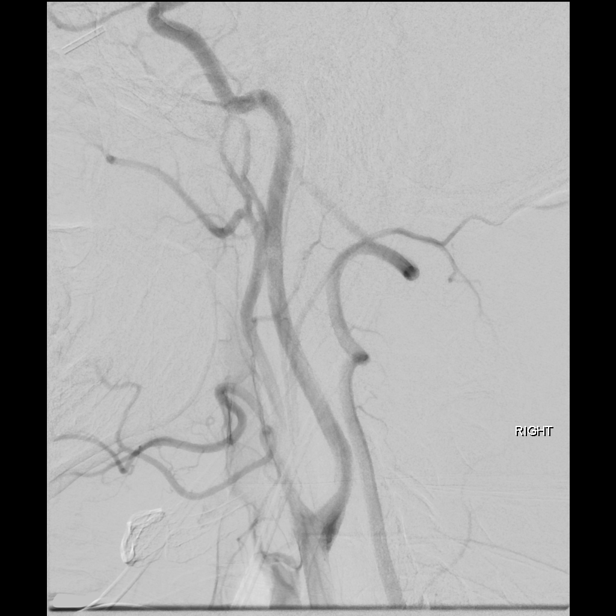
[im 23/166]
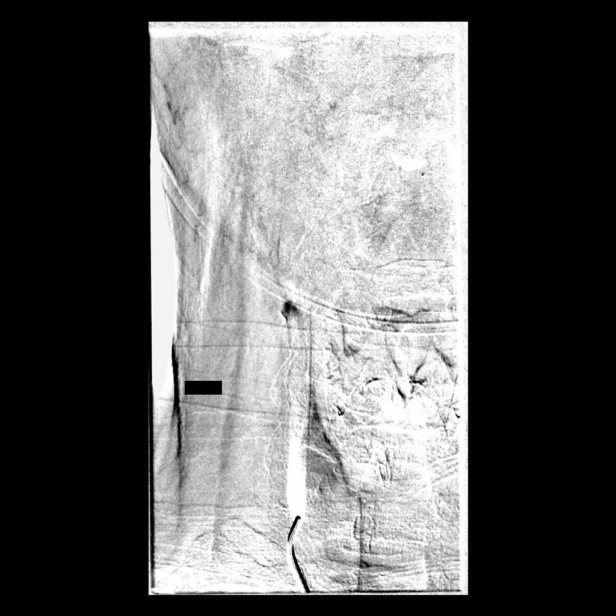
[im 38/166]
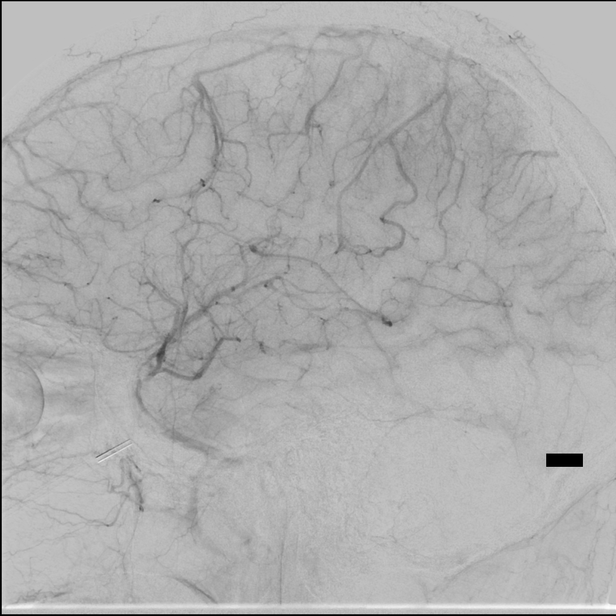
[im 53/166]
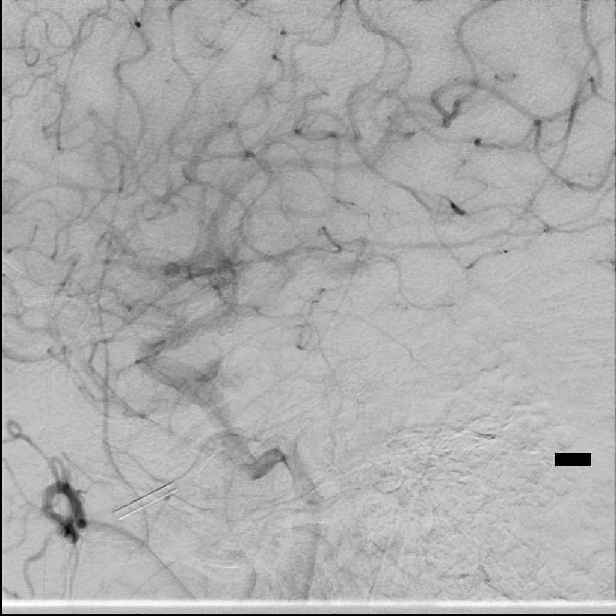
[im 68/166]
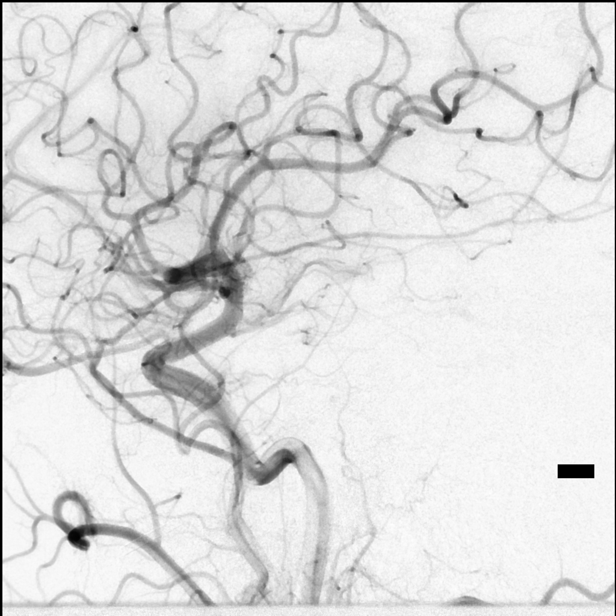
[im 83/166]
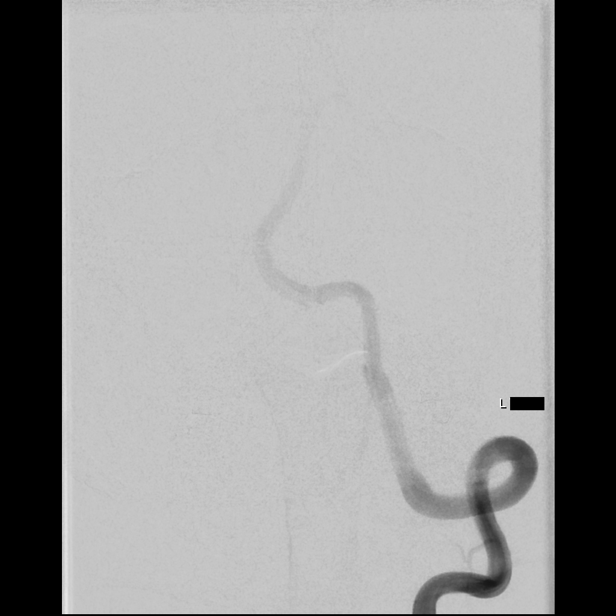
[im 91/166]
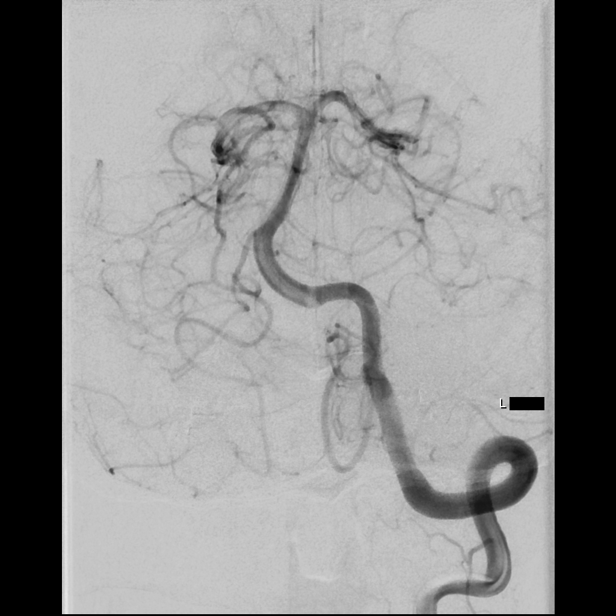
[im 106/166]
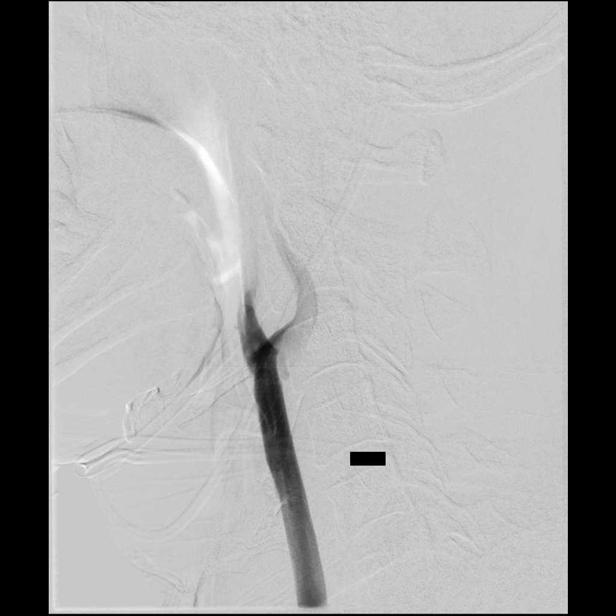
[im 121/166]
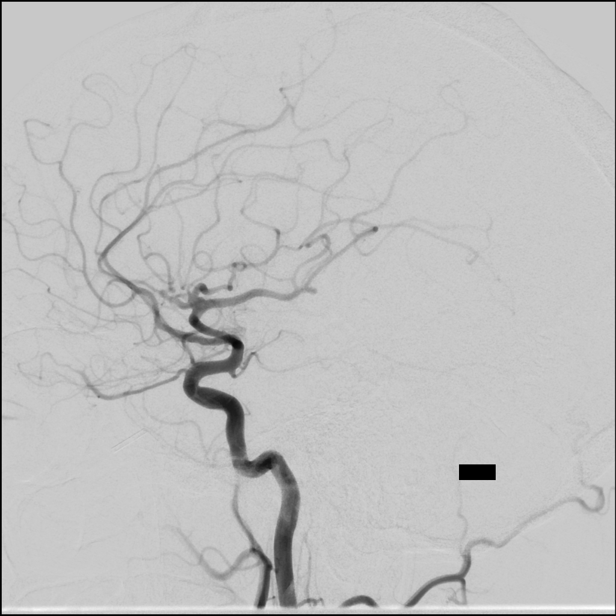
[im 136/166]
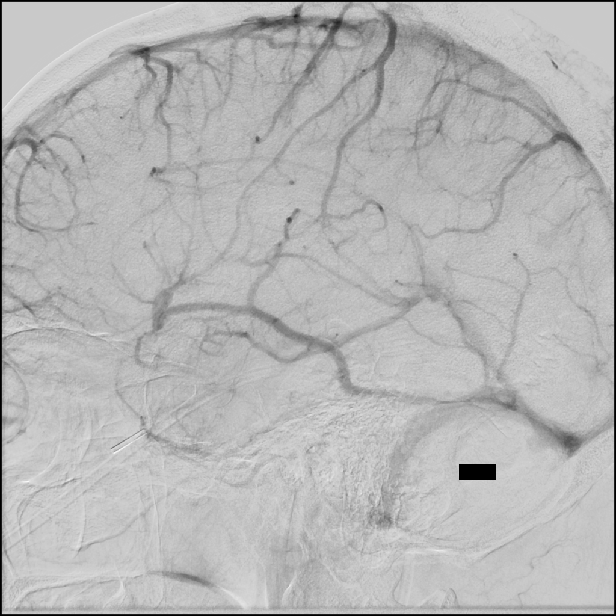
[im 151/166]
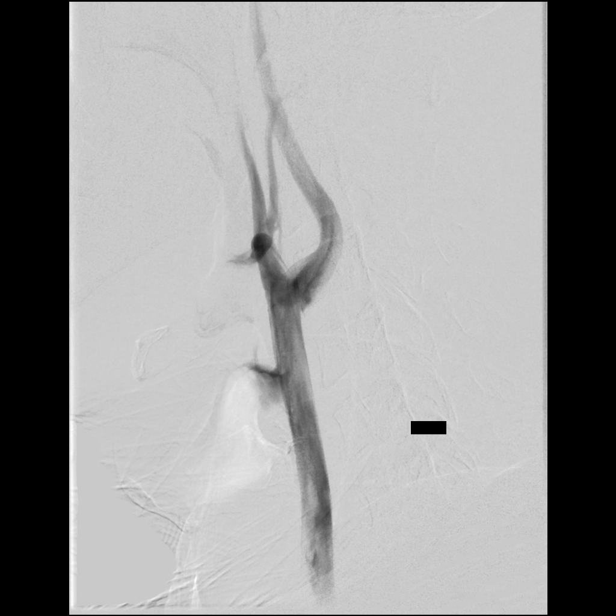
[im 166/166]
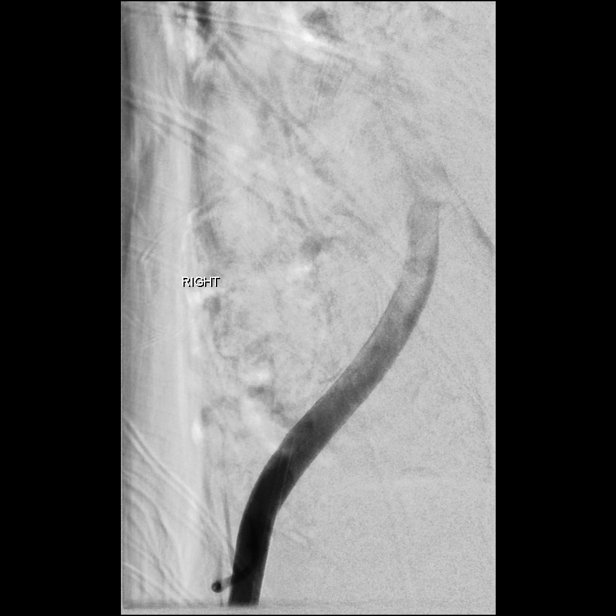

[13 of 24 positions shown; findings below may reference images not displayed]

MEDICATIONS:
Heparin [AM] units IV. No antibiotic was administered within 1 hour
of the procedure.

ANESTHESIA/SEDATION:
Versed 1 mg IV; Fentanyl 25 mcg IV

Moderate Sedation Time:  33 minutes

The patient was continuously monitored during the procedure by the
interventional radiology nurse under my direct supervision.

CONTRAST:  Isovue 300 approximately 75 mL

FLUOROSCOPY TIME:  Fluoroscopy Time: 5 minutes 48 seconds (635 mGy).

COMPLICATIONS:
None immediate.
The right groin was prepped and draped in the usual sterile fashion.
Thereafter using modified Seldinger technique, transfemoral access
into the right common femoral artery was obtained without
difficulty. Over a 0.035 inch guidewire, a 5 French Pinnacle sheath
was inserted. Through this, and also over 0.035 inch guidewire, a 5
French JB 1 catheter was advanced to the aortic arch region and
selectively positioned in the right common carotid artery, , the
left common carotid artery and the left vertebral artery.
FINDINGS: The right common carotid arteriogram demonstrates the right external
carotid artery and its major branches to be widely patent.

The right internal carotid artery at the bulb to the cranial skull
base is widely patent. The petrous, cavernous and the supraclinoid
segments are widely patent. The right middle cerebral artery and the
right anterior cerebral artery opacify into the capillary and venous
phases.

Mild prominence is seen at the level of the mid right M1 segment
probably representing an infundibulum of the origin of the lateral
lenticulostriate branch.

The origin the left vertebral artery is widely patent. The vessel
opacifies to the cranial skull base. Wide patency is seen of the
left vertebrobasilar junction and the left posterior-inferior
cerebellar artery.

The opacified portion of the basilar artery, the posterior cerebral
arteries, the superior cerebellar arteries and the anterior-inferior
cerebellar arteries demonstrate wide patency into the capillary and
venous phases. Unopacified blood is seen in the basilar artery from
the contralateral vertebral artery.

The left common carotid arteriogram demonstrates the left external
carotid artery and its major branches to be widely patent.

The left internal carotid artery at the bulb to the cranial skull
base is widely patent.

The petrous, the cavernous and the supraclinoid segments are widely
patent.

The left middle cerebral artery and the left anterior cerebral
artery opacify into the capillary and venous phases. Cross-filling
via the anterior communicating artery of the right anterior cerebral
A2 segment and distally is seen.

Also seen is prominence in the left middle cerebral artery
trifurcation region. A 3D rotational arteriogram with reconstruction
on a separate workstation demonstrates approximately 2.2 mm x 1.6 mm
outpouching representing a wide-based aneurysm.

There is mild fusiform prominence at the origin of the inferior
division of the left middle cerebral artery.
IMPRESSION: Approximately 2.7 mm x 1.6 mm left middle cerebral artery
trifurcation region aneurysm with mild fusiform prominence of the
proximal inferior division of the left middle cerebral artery.

PLAN:
Findings were reviewed with the patient via an interpreter.

He was then informed of the angiographic findings to which he
expressed having understood as per the interpreter.

Following CT angiogram of the head and neck in 1 year.

## 2020-05-05 MED ORDER — SODIUM CHLORIDE 0.9 % IV BOLUS
INTRAVENOUS | Status: AC | PRN
  Administered 2020-05-05: 250 mL via INTRAVENOUS

## 2020-05-05 MED ORDER — MIDAZOLAM HCL 2 MG/2ML IJ SOLN
INTRAMUSCULAR | Status: AC
  Filled 2020-05-05: qty 2

## 2020-05-05 MED ORDER — IOHEXOL 300 MG/ML  SOLN
150.0000 mL | Freq: Once | INTRAMUSCULAR | Status: AC | PRN
  Administered 2020-05-05: 75 mL via INTRA_ARTERIAL

## 2020-05-05 MED ORDER — PANTOPRAZOLE SODIUM 40 MG PO TBEC
40.0000 mg | DELAYED_RELEASE_TABLET | Freq: Every day | ORAL | 1 refills | Status: DC
Start: 2020-05-06 — End: 2020-05-05

## 2020-05-05 MED ORDER — IOHEXOL 300 MG/ML  SOLN
100.0000 mL | Freq: Once | INTRAMUSCULAR | Status: AC | PRN
  Administered 2020-05-05: 20 mL via INTRA_ARTERIAL

## 2020-05-05 MED ORDER — PANTOPRAZOLE SODIUM 40 MG PO TBEC
40.0000 mg | DELAYED_RELEASE_TABLET | Freq: Every day | ORAL | 1 refills | Status: DC
Start: 2020-05-06 — End: 2023-08-31

## 2020-05-05 MED ORDER — HEPARIN SODIUM (PORCINE) 1000 UNIT/ML IJ SOLN
INTRAMUSCULAR | Status: AC | PRN
  Administered 2020-05-05: 1000 [IU] via INTRAVENOUS

## 2020-05-05 MED ORDER — DEXAMETHASONE 4 MG PO TABS: 4.0000 mg | ORAL_TABLET | Freq: Three times a day (TID) | ORAL | 1 refills | Status: AC

## 2020-05-05 MED ORDER — FENTANYL CITRATE (PF) 100 MCG/2ML IJ SOLN
INTRAMUSCULAR | Status: AC | PRN
  Administered 2020-05-05: 25 ug via INTRAVENOUS

## 2020-05-05 MED ORDER — DEXAMETHASONE 4 MG PO TABS: 4.0000 mg | ORAL_TABLET | Freq: Three times a day (TID) | ORAL | 1 refills | Status: DC

## 2020-05-05 MED ORDER — HEPARIN SODIUM (PORCINE) 1000 UNIT/ML IJ SOLN
INTRAMUSCULAR | Status: AC
  Filled 2020-05-05: qty 1

## 2020-05-05 MED ORDER — SODIUM CHLORIDE 0.9 % IV SOLN: INTRAVENOUS | Status: DC

## 2020-05-05 MED ORDER — LIDOCAINE HCL 1 % IJ SOLN
INTRAMUSCULAR | Status: AC
  Filled 2020-05-05: qty 20

## 2020-05-05 MED ORDER — MIDAZOLAM HCL 2 MG/2ML IJ SOLN
INTRAMUSCULAR | Status: AC | PRN
  Administered 2020-05-05: 1 mg via INTRAVENOUS

## 2020-05-05 MED ORDER — FENTANYL CITRATE (PF) 100 MCG/2ML IJ SOLN
INTRAMUSCULAR | Status: AC
  Filled 2020-05-05: qty 2

## 2020-05-05 NOTE — Progress Notes (Signed)
NURSING PROGRESS NOTE  Aaron Chapman [id number] Discharge Data: 05/05/2020 5:45 PM Attending Provider: Burnadette Pop, MD [id number], No Pcp Per     [name] discharged per MD order.  Discussed with the patient the After Visit Summary and all questions fully answered. All IV's discontinued with no bleeding noted. All belongings returned to patient for patient to take home.   Last Vital Signs:  Blood pressure 136/86, pulse 85, temperature 98.6 F (37 C), temperature source Oral, resp. rate 18, height 6\' 1"  (1.854 m), weight 79.2 kg, SpO2 99 %.  Discharge Medication List Allergies as of 05/05/2020      Reactions   Aspirin Swelling   Facial swelling      Medication List    STOP taking these medications   amoxicillin-clavulanate 875-125 MG tablet Commonly known as: AUGMENTIN     TAKE these medications   dexamethasone 4 MG tablet Commonly known as: DECADRON Take 1 tablet (4 mg total) by mouth every 8 (eight) hours. What changed: when to take this   ibuprofen 800 MG tablet Commonly known as: ADVIL Take 800 mg by mouth every 8 (eight) hours as needed for fever.   pantoprazole 40 MG tablet Commonly known as: PROTONIX Take 1 tablet (40 mg total) by mouth daily. Start taking on: May 06, 2020   promethazine-dextromethorphan 6.25-15 MG/5ML syrup Commonly known as: PROMETHAZINE-DM Take 5 mLs by mouth 4 (four) times daily as needed for cough. What changed: when to take this   VITAMIN A PO Take 1 capsule by mouth daily.   vitamin C 1000 MG tablet Take 1,000 mg by mouth daily.

## 2020-05-05 NOTE — Procedures (Signed)
S/P bilateral common carotid and Lt vert artery angiograms . RT CFA approach. Findings. 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 S.Aaron Rhodus MD

## 2020-05-05 NOTE — Sedation Documentation (Signed)
Right femoral sheath removed.  

## 2020-05-05 NOTE — Progress Notes (Signed)
PT Cancellation Note  Patient Details Name: Aaron Chapman MRN: [id number] DOB: 09/03/1975   Cancelled Treatment:    Reason Eval/Treat Not Completed: Active bedrest order  Patient s/p sheath removal and remains on bedrest. Per RN, bedrest ends at 3:35 pm.   Jerolyn Center, PT Pager [phone]   Scherrie November Tephanie Escorcia 05/05/2020, 2:59 PM

## 2020-05-05 NOTE — Discharge Summary (Signed)
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 Outpatient he had received a course of steroids and completed course of Augmentin. Due to persistent fever had CT chest which showed right lower lobe nodule, left lower lobe nodule and bilateral hilar lymphadenopathy. Some focal sclerosing lesion in the L1 vertebral body. Later also developed truncal and bilateral upper and lower extremity rash. 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 ID consulted and started on steroids and RIPE therapy. Pulmonary team was consulted and underwent bronchoscopy/EBUS on 9/30. Thereafter spiked fever overnight. 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 Case discussed with neuro interventional and neurology who recommended cerebral angiogram.  Cerebral angiogram did not show any aneurysm or occlusion.  ID has already signed off did not recommend any antibiotics.  Current plan is to continue steroids and follow-up with neurology,Dr Felecia Shelling, as an outpatient.  He is medically stable for discharge to home today.  Following problems were addressed during his hospitalization:  Recurrent fevers with headache/suspicion for meningitis/hilar lymphadenopathy/pulmonary nodules/neurosarcoidosis: Presented with recurrent fever, headache from home.  CT imaging showed pulmonary nodules, hilar lymphadenopathy.  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   ID was consulted and started on steroids and antibiotics.  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  ID was closely following.  Antibiotics have been discontinued.  Picture is highly sensitive of neurosarcoidosis/vasculitis.  Case has been discussed with neurology who recommend to continue steroids and follow-up with neurology, Dr. SFelecia Shelling as an outpatient. Blood cultures have been negative. He will be discharged on oral decadron.  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  CSF studies for AFB is negative.  HIV negative.  QuantiFERON test negative.  CT chest showed multifocal mediastinal/hilar adenopathy, calcification of right hilar nodules, granulomatous disease.  He status post bronc/EBUS by pulmonology which did not show malignant cells as per biopsy.  He needs to follow-up with pulmonology as an outpatient.  Echocardiogram is  unremarkable.    Discharge Diagnoses:  Active Problems:   Fever and chills   Bilateral headaches   Meningitis    Discharge Instructions  Discharge Instructions    Ambulatory referral to Neurology   Complete by: As directed    An appointment is requested in approximately:1 week   Diet general   Complete by: As directed    Discharge instructions   Complete by: As directed    1)[facility].  Name and number of  the clinic has been attached 2)Follow up with Dr. [facility]Felecia Shelling [facility],as an outpatient. name and number the provider has been attached.  Call for appointment. 3)Take prescribed medications as instructed.   Increase activity slowly   Complete by: As directed  No wound care    Complete by: As directed      Allergies as of 05/05/2020      Reactions   Aspirin Swelling   Facial swelling      Medication List    STOP taking these medications   amoxicillin-clavulanate 875-125 MG tablet Commonly known as: AUGMENTIN     TAKE these medications   dexamethasone 4 MG tablet Commonly known as: DECADRON Take 1 tablet (4 mg total) by mouth every 8 (eight) hours. What changed: when to take this   ibuprofen 800 MG tablet Commonly known as: ADVIL Take 800 mg by mouth every 8 (eight) hours as needed for fever.   pantoprazole 40 MG tablet Commonly known as: PROTONIX Take 1 tablet (40 mg total) by mouth daily. Start taking on: May 06, 2020   promethazine-dextromethorphan 6.25-15 MG/5ML syrup Commonly known as: PROMETHAZINE-DM Take 5 mLs by mouth 4 (four) times daily as needed for cough. What changed: when to take this   VITAMIN A PO Take 1 capsule by mouth daily.   vitamin C 1000 MG tablet Take 1,000 mg by mouth daily.       Follow-up [facility] up.   Why: Has [facility], [facility] Contact information: [place] [phone]       Britt Bottom, MD. Schedule an appointment as soon as possible for a visit in 1 week(s).   Specialty: Neurology Contact information: [address] [place]  [address] [phone]              Allergies  Allergen Reactions  . Aspirin Swelling    Facial swelling    Consultations:  Neurology, PCCM   Procedures/Studies: CT ANGIO HEAD W OR WO CONTRAST  Result Date: 05/01/2020 CLINICAL DATA:  44 year old male with abnormal MRIs recently suspicious for basilar meningitis, cerebritis. Ectatic left MCA M2 posterior branch, query mycotic aneurysm. Status post COVID-19 1 month ago. Status post bronchoscopy with bronchoscopic biopsy of mediastinal lymphadenopathy yesterday.  Pathology results pending. EXAM: CT ANGIOGRAPHY HEAD AND NECK TECHNIQUE: Multidetector CT imaging of the head and neck was performed using the standard protocol during bolus administration of intravenous contrast. Multiplanar CT image reconstructions and MIPs were obtained to evaluate the vascular anatomy. Carotid stenosis measurements (when applicable) are obtained utilizing NASCET criteria, using the distal internal carotid diameter as the denominator. CONTRAST:  43m OMNIPAQUE IOHEXOL 350 MG/ML SOLN COMPARISON:  Brain MRI earlier today. Brain MRI and intracranial MRA 04/25/2020. chest CT 04/10/2020. FINDINGS: CT HEAD Brain: No intracranial hemorrhage is identified. Largely unremarkable noncontrast CT appearance of the basilar cisterns. No posterior fossa encephalomalacia identified on the right although overlying previous right suboccipital craniectomy (series 5, image 9). Conspicuous vascular calcification at the left MCA trifurcation (series 5, image 15 and series 8, image 25), although no other intracranial calcified plaque is evident. No ventriculomegaly. No cortically based acute infarct identified. Gray-white matter differentiation within normal limits. Calvarium and skull base: Previous right suboccipital craniectomy. No acute osseous abnormality identified. Paranasal sinuses: Visualized paranasal sinuses and mastoids are stable and well pneumatized. Orbits: No acute orbit or scalp soft tissue finding. CTA NECK Skeleton: Carious posterior dentition on the left. Benign-appearing C6 vertebral body hemangioma. No acute or suspicious osseous lesion identified. Upper chest: Mediastinal and hilar lymphadenopathy redemonstrated. Visible lungs are clear. Other neck: Negative.  No cervical lymphadenopathy. Aortic arch: 3 vessel arch configuration.  No arch atherosclerosis. Right carotid  system: Negative. Left carotid system: Negative. Vertebral arteries: Negative.  The left vertebral artery is mildly dominant. CTA  HEAD Posterior circulation: Distal vertebral arteries are patent, with mild irregularity of the distal right V4 segment but no distal vertebral stenosis. Normal vertebrobasilar junction. Both PICA origins are patent and within normal limits. Patent basilar artery with mild tortuosity. No basilar stenosis. SCA and PCA origins are normal. Small posterior communicating arteries. Left PCA branches are within normal limits. Right PCA branches are mildly irregular (series 14, image 21). 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 Anterior circulation: Both ICA siphons are patent with no plaque or stenosis identified. Normal ophthalmic and posterior communicating artery origins. Patent carotid termini. Patent MCA and ACA origins. Anterior communicating artery and bilateral ACA branches are within normal limits. Left MCA M1 segment is within normal limits. 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 The vessel enlargement is more fusiform and saccular, although there does appear to be a subtle saccular component directed anteriorly and superiorly on series 13, image 151. The origin of all M2 branches are incorporated. Elsewhere the dominant posterior M2 to M3 branching also appears irregular on series 14, image 32. And other left MCA branches are also irregular on series 14, image 13. 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 Right MCA bifurcation is patent without stenosis. Right MCA branches appear more normal than those on the left side (series 14, image 16). Venous sinuses: Patent. The vein of Galen and the inferior sagittal sinus do appear mildly irregular. But there is no venous sinus filling defect identified. Anatomic variants: Mildly  dominant left vertebral artery. Review of the MIP images confirms the above findings IMPRESSION: 1. Positive for intracranial vessel irregularity, most pronounced at the the left MCA and right PCA branches, suspicious for Acute Vasculitis. 2. 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 A conspicuous vessel calcification is noted there, with no superimposed atherosclerosis elsewhere in the head and neck. 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 3. 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 No new abnormality of the brain parenchyma identified by CT. The patient has previously had a right suboccipital craniotomy of unclear significance. 4. Mediastinal and hilar lymphadenopathy again noted, recently biopsied by bronchoscopy. Electronically Signed   By: Genevie Ann M.D.   On: 05/01/2020 22:48   DG Chest 2 View  Result Date: 05/01/2020 CLINICAL DATA:  Mediastinal lymphadenopathy EXAM: CHEST - 2 VIEW COMPARISON:  Chest CT 04/27/2020 FINDINGS: Stable cardiac silhouette. Again demonstrated bilateral prominence of the hilar structures consistent with hilar lymphadenopathy. No interval change. No lung parenchymal abnormality identified. IMPRESSION: Persistent bilateral hilar lymphadenopathy. Common differential would include sarcoidosis versus lymphoma. Electronically Signed   By: Suzy Bouchard M.D.   On: 05/01/2020 11:00   CT CHEST WO CONTRAST  Result Date: 04/27/2020 CLINICAL DATA:  Adenopathy on recent chest radiograph EXAM: CT CHEST WITHOUT CONTRAST TECHNIQUE: Multidetector CT imaging of the chest was performed following the standard protocol without IV contrast. COMPARISON:  Chest radiograph April 25, 2020 and chest CT April 10, 2020. FINDINGS: Cardiovascular: There is no evident thoracic aortic aneurysm. Visualized great vessels appear unremarkable noncontrast enhanced study. There is no pericardial effusion or pericardial  thickening. Mediastinum/Nodes: Thyroid appears normal. Adenopathy is again noted  at multiple sites. Multiple lymph nodes in the paratracheal regions are noted. A lymph node slightly to the right of the trachea anteriorly measures 1.3 x 1.3 cm. A lymph node anterior to the carina on the right measures 1.7 x 1.2 cm. A lymph node in the aortopulmonary window region measures 1.5 x 1.1 cm. A lymph node to the left of the aortic arch measures 1.9 x 1.5 cm. There are hilar lymph nodes bilaterally, less than optimally discerned due to lack of contrast. A lymph node in the left hilar region measures 1.8 x 1.3 cm. A lymph node in the right hilar region measures 1.8 x 1.3 cm. Several other mildly prominent hilar lymph nodes are noted. There are enlarged subcarinal lymph nodes, largest measuring 2.4 x 1 1.8 cm, marginally larger than on most recent chest CT examination. Occasional calcification noted in hilar lymph nodes on the right. No other lymph node calcification is appreciable. Lungs/Pleura: There is no edema or airspace opacity. There is a stable 3 mm nodular opacity in the anterior segment of the left lobe seen on axial slice 94 series 4. A 4 mm nodular opacity in the lateral segment right lower lobe is stable, seen on axial slice 366 series 4. No pleural effusions evident. There are scattered areas of mild atelectatic change in the left lower lobe. Upper Abdomen: There is a focal aneurysm arising from the splenic artery measuring 2.0 x 1.5 cm with peripheral calcification, stable. Visualized upper abdominal structures otherwise appear unremarkable on this noncontrast study. Musculoskeletal: There is a bone island in the lateral aspect of the L1 vertebral body. No blastic or lytic bone lesions evident. No chest wall lesions. IMPRESSION: 1. Multifocal mediastinal and hilar adenopathy again noted with a subcarinal lymph node slightly larger than on recent CT. Other areas of adenopathy appear essentially stable. Adenopathy  of this nature may be seen with granulomatous disease such as tuberculosis as a primary manifestation. Note that neoplasm must be of concern given this finding as well; neoplastic etiology for adenopathy is not excluded on this study. Calcification in right hilar lymph nodes does suggest prior granulomatous disease. This finding may warrant thoracic surgery consultation for potential central lymph node sampling. 2. Stable 3-4 mm nodular opacity on each side. No edema or airspace opacity no pleural effusions. Mild atelectatic change in the left lower lobe must be considered nonspecific. 3. 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 Clinical significance of this finding questionable. Electronically Signed   By: Lowella Grip III M.D.   On: 04/27/2020 11:11   CT CHEST W CONTRAST  Result Date: 04/10/2020 CLINICAL DATA:  Follow-up abnormal chest x-ray EXAM: CT CHEST WITH CONTRAST TECHNIQUE: Multidetector CT imaging of the chest was performed during intravenous contrast administration. CONTRAST:  60m ISOVUE-300 IOPAMIDOL (ISOVUE-300) INJECTION 61% COMPARISON:  Chest radiograph 03/26/2020 FINDINGS: Cardiovascular: Normal heart size. No pericardial effusion. Thoracic aorta is normal in caliber. Mediastinum/Nodes: Mediastinal and hilar adenopathy is present. For example, right subcarinal node measuring 1.6 x 2.4 cm (series 2, image 78); right hilar node measuring 3.6 x 2.2 cm (image 85); pretracheal node measuring 1.4 x 1.2 cm (image 46). Few small foci of right hilar calcification. Thyroid is unremarkable. Esophagus is unremarkable. Lungs/Pleura: No consolidation or mass. There is a 3 mm nodule of the left lower lobe (series 5, image 84). Right lower lobe nodule measuring 4 mm (image 120) Upper Abdomen: No acute abnormality. Musculoskeletal: Focus of sclerosis within the L1 vertebral body probably reflects a  bone island. No acute osseous abnormality. IMPRESSION: Mediastinal and bilateral  hilar lymphadenopathy. Right lower lobe 4 mm nodule. Left lower lobe 3 mm nodule. No follow-up needed if patient is low-risk (and has no known or suspected primary neoplasm). Non-contrast chest CT can be considered in 12 months depending on the etiology of above. Electronically Signed   By: Macy Mis M.D.   On: 04/10/2020 16:05   MR ANGIO HEAD WO CONTRAST  Result Date: 04/25/2020 CLINICAL DATA:  Neuro deficit, transient right arm weakness with aphasia. EXAM: MR HEAD WITHOUT CONTRAST MR CIRCLE OF WILLIS WITHOUT CONTRAST MRA OF THE NECK WITHOUT AND WITH CONTRAST TECHNIQUE: Multiplanar, multiecho pulse sequences of the brain, circle of willis and surrounding structures were obtained without intravenous contrast. Angiographic images of the neck were obtained using MRA technique without and with intravenous contrast. CONTRAST:  8.17m GADAVIST GADOBUTROL 1 MMOL/ML IV SOLN COMPARISON:  None. FINDINGS: MR HEAD FINDINGS Brain: T2/FLAIR hyperintense foci involving the right middle cerebellar peduncle (9:7, 6). No diffusion-weighted signal abnormality. No intracranial hemorrhage. No midline shift, ventriculomegaly or extra-axial fluid collection. No mass lesion. Vascular: Please see CTA head. Skull and upper cervical spine: Normal marrow signal. Sinuses/Orbits: Normal orbits. Clear paranasal sinuses. Trace mastoid effusion. Other: Right frontal scalp susceptibility artifact. MR CIRCLE OF WILLIS FINDINGS Anterior circulation: The bilateral internal carotid, anterior and middle cerebral arteries are patent and normal caliber. No significant stenosis, proximal occlusion, aneurysm, or vascular malformation. Posterior circulation: Dominant left vertebral artery. Patent bilateral PICA. Dominant right AICA. Normal caliber patent basilar, superior cerebellar and posterior cerebral arteries. No significant stenosis, proximal occlusion, aneurysm, or vascular malformation. Venous sinuses: No evidence of thrombosis. Anatomic  variants: Bilateral PCOM hypoplasia. MRA NECK FINDINGS There is no high-grade narrowing or focal aneurysm involving the bilateral carotid arteries. No evidence of dissection. The bilateral vertebral arteries are patent and demonstrate antegrade flow. Dominant left vertebral artery. No evidence of high-grade narrowing or focal aneurysm. Three vessel aortic arch. IMPRESSION: MRI HEAD: T2/FLAIR hyperintense foci involving the right middle cerebellar peduncle are suspicious for demyelination. Consider postcontrast imaging for further evaluation. MRA HEAD: Normal MRA head. MRA NECK: Normal MRA neck. Electronically Signed   By: CPrimitivo GauzeM.D.   On: 04/25/2020 16:10   MR Angiogram Neck W or Wo Contrast  Result Date: 04/25/2020 CLINICAL DATA:  Neuro deficit, transient right arm weakness with aphasia. EXAM: MR HEAD WITHOUT CONTRAST MR CIRCLE OF WILLIS WITHOUT CONTRAST MRA OF THE NECK WITHOUT AND WITH CONTRAST TECHNIQUE: Multiplanar, multiecho pulse sequences of the brain, circle of willis and surrounding structures were obtained without intravenous contrast. Angiographic images of the neck were obtained using MRA technique without and with intravenous contrast. CONTRAST:  8.563mGADAVIST GADOBUTROL 1 MMOL/ML IV SOLN COMPARISON:  None. FINDINGS: MR HEAD FINDINGS Brain: T2/FLAIR hyperintense foci involving the right middle cerebellar peduncle (9:7, 6). No diffusion-weighted signal abnormality. No intracranial hemorrhage. No midline shift, ventriculomegaly or extra-axial fluid collection. No mass lesion. Vascular: Please see CTA head. Skull and upper cervical spine: Normal marrow signal. Sinuses/Orbits: Normal orbits. Clear paranasal sinuses. Trace mastoid effusion. Other: Right frontal scalp susceptibility artifact. MR CIRCLE OF WILLIS FINDINGS Anterior circulation: The bilateral internal carotid, anterior and middle cerebral arteries are patent and normal caliber. No significant stenosis, proximal occlusion,  aneurysm, or vascular malformation. Posterior circulation: Dominant left vertebral artery. Patent bilateral PICA. Dominant right AICA. Normal caliber patent basilar, superior cerebellar and posterior cerebral arteries. No significant stenosis, proximal occlusion, aneurysm, or vascular malformation. Venous sinuses: No evidence  of thrombosis. Anatomic variants: Bilateral PCOM hypoplasia. MRA NECK FINDINGS There is no high-grade narrowing or focal aneurysm involving the bilateral carotid arteries. No evidence of dissection. The bilateral vertebral arteries are patent and demonstrate antegrade flow. Dominant left vertebral artery. No evidence of high-grade narrowing or focal aneurysm. Three vessel aortic arch. IMPRESSION: MRI HEAD: T2/FLAIR hyperintense foci involving the right middle cerebellar peduncle are suspicious for demyelination. Consider postcontrast imaging for further evaluation. MRA HEAD: Normal MRA head. MRA NECK: Normal MRA neck. Electronically Signed   By: Primitivo Gauze M.D.   On: 04/25/2020 16:10   MR BRAIN WO CONTRAST  Result Date: 04/25/2020 CLINICAL DATA:  Neuro deficit, transient right arm weakness with aphasia. EXAM: MR HEAD WITHOUT CONTRAST MR CIRCLE OF WILLIS WITHOUT CONTRAST MRA OF THE NECK WITHOUT AND WITH CONTRAST TECHNIQUE: Multiplanar, multiecho pulse sequences of the brain, circle of willis and surrounding structures were obtained without intravenous contrast. Angiographic images of the neck were obtained using MRA technique without and with intravenous contrast. CONTRAST:  8.28m GADAVIST GADOBUTROL 1 MMOL/ML IV SOLN COMPARISON:  None. FINDINGS: MR HEAD FINDINGS Brain: T2/FLAIR hyperintense foci involving the right middle cerebellar peduncle (9:7, 6). No diffusion-weighted signal abnormality. No intracranial hemorrhage. No midline shift, ventriculomegaly or extra-axial fluid collection. No mass lesion. Vascular: Please see CTA head. Skull and upper cervical spine: Normal marrow  signal. Sinuses/Orbits: Normal orbits. Clear paranasal sinuses. Trace mastoid effusion. Other: Right frontal scalp susceptibility artifact. MR CIRCLE OF WILLIS FINDINGS Anterior circulation: The bilateral internal carotid, anterior and middle cerebral arteries are patent and normal caliber. No significant stenosis, proximal occlusion, aneurysm, or vascular malformation. Posterior circulation: Dominant left vertebral artery. Patent bilateral PICA. Dominant right AICA. Normal caliber patent basilar, superior cerebellar and posterior cerebral arteries. No significant stenosis, proximal occlusion, aneurysm, or vascular malformation. Venous sinuses: No evidence of thrombosis. Anatomic variants: Bilateral PCOM hypoplasia. MRA NECK FINDINGS There is no high-grade narrowing or focal aneurysm involving the bilateral carotid arteries. No evidence of dissection. The bilateral vertebral arteries are patent and demonstrate antegrade flow. Dominant left vertebral artery. No evidence of high-grade narrowing or focal aneurysm. Three vessel aortic arch. IMPRESSION: MRI HEAD: T2/FLAIR hyperintense foci involving the right middle cerebellar peduncle are suspicious for demyelination. Consider postcontrast imaging for further evaluation. MRA HEAD: Normal MRA head. MRA NECK: Normal MRA neck. Electronically Signed   By: CPrimitivo GauzeM.D.   On: 04/25/2020 16:10   MR BRAIN W CONTRAST  Result Date: 04/26/2020 CLINICAL DATA:  Follow-up examination for demyelinating disease, headaches, fevers. EXAM: MRI HEAD WITH CONTRAST TECHNIQUE: Multiplanar, multiecho pulse sequences of the brain and surrounding structures were obtained with intravenous contrast. CONTRAST:  7.555mGADAVIST GADOBUTROL 1 MMOL/ML IV SOLN COMPARISON:  Prior brain MRI from 04/25/2020. FINDINGS: Thin-section FLAIR sequences were performed prior to the administration of IV contrast. Images demonstrate diffuse abnormal FLAIR signal intensity involving the cortical  sulci/leptomeningeal both cerebral hemispheres, most pronounced at the parieto-occipital regions, fairly symmetric in nature. Additionally, abnormal subtle abnormal leptomeningeal FLAIR signal intensity seen within the right greater than left cerebellum. Patchy parenchymal signal abnormality noted at the right middle cerebellar peduncle, corresponding with abnormality on prior brain MRI. Abnormal FLAIR signal extends to involve the right 7/8 complex and right internal auditory canal, with probable involvement of the right inner ear structures as well (series 3, image 39). Suspected trace intraventricular debris (series 3, image 77). Following contrast administration, scattered leptomeningeal enhancement seen about the right greater than left cerebellum. Mild patchy parenchymal enhancement noted at the  level of the right middle cerebellar peduncle signal abnormality (series 5, image 16). Additionally, prominent leptomeningeal and/or dural enhancement noted at the right CP angle cistern, extending into the right IAC (series 5, image 15). No restricted diffusion seen at the level of the right CP angle cistern on prior brain MRI to suggest abscess at this location. Extension to involve the upper cervical spinal cord again noted, described on corresponding cervical spine MRI. Subtle leptomeningeal enhancement noted about the parieto-occipital regions as well. Given the history of in fevers, headaches, and abnormal CSF values, findings most concerning for acute meningitis with associated cerebritis at the right middle cerebellar peduncle. No frank abscess or other collection. Normal opacification of the major dural sinuses noted. No appreciable ependymal enhancement to suggest associated ventriculitis. 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 Given the basilar predilection, atypical etiologies including TB and/or  fungal meningitis could be considered. No discrete abscess, hydrocephalus, or other complication at this time. Electronically Signed   By: Jeannine Boga M.D.   On: 04/26/2020 01:37   MR BRAIN W WO CONTRAST  Result Date: 05/01/2020 CLINICAL DATA:  Brain mass or lesion; follow up. EXAM: MRI HEAD WITHOUT AND WITH CONTRAST TECHNIQUE: Multiplanar, multiecho pulse sequences of the brain and surrounding structures were obtained without and with intravenous contrast. CONTRAST:  7.64m GADAVIST GADOBUTROL 1 MMOL/ML IV SOLN COMPARISON:  MRI of the brain April 25 2020 FINDINGS: Brain: No acute infarction, hemorrhage, hydrocephalus or mass lesion. No significant interval change of the leptomeningeal thickening and contrast enhancement along the bilateral cerebellar sulci, brainstem surface predominantly on the right side and more pronounced on the right, extending into the right internal auditory canal. Small collection in the right cerebellopontine angle cistern, measuring approximately 1.3 x 0.5 mm, unchanged from prior. 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 Vascular: Flow voids are preserved. Ectatic left MCA/M2 posterior division branch (series 6, images 12 and 13). Given ongoing infectious process, evaluation with CT angiogram is suggested to exclude mycotic aneurysm. Skull and upper cervical spine: Normal marrow signal. Sinuses/Orbits: Negative. Other: Small left mastoid effusion. IMPRESSION: 1. No significant interval change of the basal leptomeningeal process with small collection in the right cerebellopontine angle and associated right middle cerebellar peduncle cerebritis. 2. Ectatic left MCA/M2 posterior division branch. Given ongoing infectious process, evaluation with CT angiogram is suggested to exclude mycotic aneurysm. These results will be  called to the ordering clinician or representative by the Radiologist Assistant, and communication documented in the PACS or [employer] Electronically Signed   By: KPedro EarlsM.D.   On: 05/01/2020 11:07   MR CERVICAL SPINE W WO CONTRAST  Addendum Date: 04/26/2020   ADDENDUM REPORT: 04/26/2020 01:15 ADDENDUM: Upon further consideration and review of the corresponding postcontrast brain MRI, note is made of subtle circumferential leptomeningeal enhancement about the upper cervical spinal cord, extending from the cervicomedullary region to approximately C2-3. Associated leptomeningeal enhancement noted about the brainstem and cerebellum, better evaluated on corresponding brain MRI. Given the history of fevers, headaches, and abnormal CSF values, findings most concerning for acute infection/meningitis. No associated cord edema or frank intramedullary enhancement. Electronically Signed   By: BJeannine BogaM.D.   On: 04/26/2020 01:15   Result Date: 04/26/2020 CLINICAL DATA:  Initial evaluation for demyelinating disease. EXAM: MRI CERVICAL SPINE WITHOUT  AND WITH CONTRAST TECHNIQUE: Multiplanar and multiecho pulse sequences of the cervical spine, to include the craniocervical junction and cervicothoracic junction, were obtained without and with intravenous contrast. CONTRAST:  7.81m GADAVIST GADOBUTROL 1 MMOL/ML IV SOLN COMPARISON:  Prior brain MRI from 04/25/2020. FINDINGS: Alignment: Physiologic with preservation of the normal cervical lordosis. No listhesis. Vertebrae: 1 cm T2/stir hyperintense lesion within the C6 vertebral body most likely reflects an atypical hemangioma. Associated minimal central vertebral body height loss. Additional small benign hemangioma noted within the C7 vertebral body. No other discrete or worrisome osseous lesions. Underlying bone marrow signal intensity within normal limits. Vertebral body height otherwise maintained without acute or chronic fracture.  No other abnormal marrow edema or enhancement. Cord: Normal signal and morphology. No cord signal changes to suggest demyelinating disease. No abnormal enhancement. Posterior Fossa, vertebral arteries, paraspinal tissues: Negative. Disc levels: C2-C3: Unremarkable. C3-C4: Minimal annular disc bulge with uncovertebral hypertrophy. No significant spinal stenosis. Foramina remain patent. C4-C5: Mild annular disc bulge with uncovertebral hypertrophy. No significant spinal stenosis. Foramina remain patent. C5-C6: Disc desiccation with mild annular disc bulge. No spinal stenosis. Foramina remain patent. C6-C7: Disc desiccation without significant disc bulge. No canal or foraminal stenosis. No impingement. C7-T1:  Unremarkable. Visualized upper thoracic spine demonstrates no significant finding. IMPRESSION: 1. Normal MRI of the cervical spinal cord. No evidence for demyelinating disease. No abnormal enhancement. 2. 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 No significant spinal stenosis or neural foraminal narrowing. Electronically Signed: By: BJeannine BogaM.D. On: 04/26/2020 00:59   DG Chest Portable 1 View  Result Date: 04/25/2020 CLINICAL DATA:  Weakness. EXAM: PORTABLE CHEST 1 VIEW COMPARISON:  April 22, 2020 FINDINGS: The cardiac silhouette is normal. There is bilateral hilar lymphadenopathy similar to the prior studies There is no evidence of focal airspace consolidation, pleural effusion or pneumothorax. Osseous structures are without acute abnormality. Soft tissues are grossly normal. IMPRESSION: Bilateral hilar lymphadenopathy similar to the prior studies. Electronically Signed   By: DFidela SalisburyM.D.   On: 04/25/2020 12:14   DG Chest Portable 1 View  Result Date: 04/22/2020 CLINICAL DATA:  Fever, dyspnea EXAM: PORTABLE CHEST 1 VIEW COMPARISON:  03/26/2020, CT 04/10/2020 FINDINGS: Lungs are clear. No pneumothorax or pleural effusion. Cardiac size is within normal limits. Bilateral  hilar fullness related to underlying hilar adenopathy is again seen and is unchanged. Pulmonary vascularity is normal. No acute bone abnormality. IMPRESSION: Stable hilar adenopathy. No radiographic evidence of acute cardiopulmonary disease. Electronically Signed   By: AFidela SalisburyMD   On: 04/22/2020 00:21   ECHOCARDIOGRAM COMPLETE  Result Date: 05/01/2020    ECHOCARDIOGRAM REPORT   Patient Name:   RJOVE BEYLDate of Exam: 05/01/2020 Medical Rec #:  [id number]     Height:       73.0 in Accession #:    [id number]    Weight:       173.5 lb Date of Birth:  1Oct 25, 1977     BSA:          2.026 m Patient Age:    89years       BP:           119/76 mmHg Patient Gender: M              HR:           105 bpm. Exam Location:  Inpatient Procedure: 2D Echo Indications:    Endocarditis I38  History:  Patient has no prior history of Echocardiogram examinations.  Sonographer:    Mikki Santee RDCS (AE) Referring Phys: [id number] ANKIT CHIRAG AMIN IMPRESSIONS  1. Left ventricular ejection fraction, by estimation, is 50 to 55%. The left ventricle has low normal function. The left ventricle has no regional wall motion abnormalities. Left ventricular diastolic parameters were normal.  2. Right ventricular systolic function is normal. The right ventricular size is normal.  3. The mitral valve is normal in structure. Trivial mitral valve regurgitation. No evidence of mitral stenosis.  4. The aortic valve has an indeterminant number of cusps. Aortic valve regurgitation is not visualized. No aortic stenosis is present.  5. The inferior vena cava is normal in size with greater than 50% respiratory variability, suggesting right atrial pressure of 3 mmHg. FINDINGS  Left Ventricle: Left ventricular ejection fraction, by estimation, is 50 to 55%. The left ventricle has low normal function. The left ventricle has no regional wall motion abnormalities. The left ventricular internal cavity size was normal in size. There is no  left ventricular hypertrophy. Left ventricular diastolic parameters were normal. Right Ventricle: The right ventricular size is normal. Right ventricular systolic function is normal. Left Atrium: Left atrial size was normal in size. Right Atrium: Right atrial size was normal in size. Pericardium: There is no evidence of pericardial effusion. Mitral Valve: The mitral valve is normal in structure. Trivial mitral valve regurgitation. No evidence of mitral valve stenosis. Tricuspid Valve: The tricuspid valve is normal in structure. Tricuspid valve regurgitation is trivial. No evidence of tricuspid stenosis. Aortic Valve: The aortic valve has an indeterminant number of cusps. Aortic valve regurgitation is not visualized. No aortic stenosis is present. Pulmonic Valve: The pulmonic valve was not well visualized. Pulmonic valve regurgitation is not visualized. No evidence of pulmonic stenosis. Aorta: The aortic root is normal in size and structure. Venous: The inferior vena cava is normal in size with greater than 50% respiratory variability, suggesting right atrial pressure of 3 mmHg. IAS/Shunts: No atrial level shunt detected by color flow Doppler.  LEFT VENTRICLE PLAX 2D LVIDd:         3.80 cm  Diastology LVIDs:         2.95 cm  LV e' medial:  8.38 cm/s LV PW:         1.10 cm  LV e' lateral: 8.59 cm/s LV IVS:        1.10 cm LVOT diam:     2.30 cm LV SV:         51 LV SV Index:   25 LVOT Area:     4.15 cm  RIGHT VENTRICLE RV S prime:     12.60 cm/s LEFT ATRIUM             Index       RIGHT ATRIUM          Index LA diam:        2.80 cm 1.38 cm/m  RA Area:     8.59 cm LA Vol (A2C):   40.3 ml 19.90 ml/m RA Volume:   14.50 ml 7.16 ml/m LA Vol (A4C):   36.3 ml 17.92 ml/m LA Biplane Vol: 38.9 ml 19.21 ml/m  AORTIC VALVE LVOT Vmax:   87.20 cm/s LVOT Vmean:  56.300 cm/s LVOT VTI:    0.122 m  AORTA Ao Root diam: 3.20 cm  SHUNTS Systemic VTI:  0.12 m Systemic Diam: 2.30 cm Kirk Ruths MD Electronically signed by Kirk Ruths MD Signature Date/Time: 05/01/2020/2:11:41  PM    Final    CT ANGIO NECK CODE STROKE  Result Date: 05/01/2020 CLINICAL DATA:  44 year old male with abnormal MRIs recently suspicious for basilar meningitis, cerebritis. Ectatic left MCA M2 posterior branch, query mycotic aneurysm. Status post COVID-19 1 month ago. Status post bronchoscopy with bronchoscopic biopsy of mediastinal lymphadenopathy yesterday. Pathology results pending. EXAM: CT ANGIOGRAPHY HEAD AND NECK TECHNIQUE: Multidetector CT imaging of the head and neck was performed using the standard protocol during bolus administration of intravenous contrast. Multiplanar CT image reconstructions and MIPs were obtained to evaluate the vascular anatomy. Carotid stenosis measurements (when applicable) are obtained utilizing NASCET criteria, using the distal internal carotid diameter as the denominator. CONTRAST:  28m OMNIPAQUE IOHEXOL 350 MG/ML SOLN COMPARISON:  Brain MRI earlier today. Brain MRI and intracranial MRA 04/25/2020. chest CT 04/10/2020. FINDINGS: CT HEAD Brain: No intracranial hemorrhage is identified. Largely unremarkable noncontrast CT appearance of the basilar cisterns. No posterior fossa encephalomalacia identified on the right although overlying previous right suboccipital craniectomy (series 5, image 9). Conspicuous vascular calcification at the left MCA trifurcation (series 5, image 15 and series 8, image 25), although no other intracranial calcified plaque is evident. No ventriculomegaly. No cortically based acute infarct identified. Gray-white matter differentiation within normal limits. Calvarium and skull base: Previous right suboccipital craniectomy. No acute osseous abnormality identified. Paranasal sinuses: Visualized paranasal sinuses and mastoids are stable and well pneumatized. Orbits: No acute orbit or scalp soft tissue finding. CTA NECK Skeleton: Carious posterior dentition on the left. Benign-appearing C6 vertebral body  hemangioma. No acute or suspicious osseous lesion identified. Upper chest: Mediastinal and hilar lymphadenopathy redemonstrated. Visible lungs are clear. Other neck: Negative.  No cervical lymphadenopathy. Aortic arch: 3 vessel arch configuration.  No arch atherosclerosis. Right carotid system: Negative. Left carotid system: Negative. Vertebral arteries: Negative.  The left vertebral artery is mildly dominant. CTA HEAD Posterior circulation: Distal vertebral arteries are patent, with mild irregularity of the distal right V4 segment but no distal vertebral stenosis. Normal vertebrobasilar junction. Both PICA origins are patent and within normal limits. Patent basilar artery with mild tortuosity. No basilar stenosis. SCA and PCA origins are normal. Small posterior communicating arteries. Left PCA branches are within normal limits. Right PCA branches are mildly irregular (series 14, image 21). 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 Anterior circulation: Both ICA siphons are patent with no plaque or stenosis identified. Normal ophthalmic and posterior communicating artery origins. Patent carotid termini. Patent MCA and ACA origins. Anterior communicating artery and bilateral ACA branches are within normal limits. Left MCA M1 segment is within normal limits. 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 The vessel enlargement is more fusiform and saccular, although there does appear to be a subtle saccular component directed anteriorly and superiorly on series 13, image 151. The origin of all M2 branches are incorporated. Elsewhere the dominant posterior M2 to M3 branching also appears irregular on series 14, image 32. And other left MCA branches are also irregular on series 14, image 13. Contralateral right MCA M1 segment is mildly irregular (series  12, image 24) with probable lenticular 0 striate artery infundibulum rather than aneurysm. Right MCA bifurcation is patent without stenosis. Right MCA branches appear more normal than those on the left side (series 14, image 16). Venous sinuses: Patent. The vein of Galen and the inferior sagittal sinus do appear mildly  irregular. But there is no venous sinus filling defect identified. Anatomic variants: Mildly dominant left vertebral artery. Review of the MIP images confirms the above findings IMPRESSION: 1. Positive for intracranial vessel irregularity, most pronounced at the the left MCA and right PCA branches, suspicious for Acute Vasculitis. 2. 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 A conspicuous vessel calcification is noted there, with no superimposed atherosclerosis elsewhere in the head and neck. 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 3. 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 No new abnormality of the brain parenchyma identified by CT. The patient has previously had a right suboccipital craniotomy of unclear significance. 4. Mediastinal and hilar lymphadenopathy again noted, recently biopsied by bronchoscopy. Electronically Signed   By: Genevie Ann M.D.   On: 05/01/2020 22:48       Subjective: Patient seen and examined the bedside this morning.  Hemodynamically stable for discharge today.  Discharge Exam: Vitals:   05/05/20 1115 05/05/20 1204  BP: 121/82 123/89  Pulse: 70 72  Resp: 13 18  Temp:  98 F (36.7 C)  SpO2: 99% 100%   Vitals:   05/05/20 1105 05/05/20 1110 05/05/20 1115 05/05/20 1204  BP: 119/81 118/82 121/82 123/89  Pulse: 81 81 70 72  Resp: _0 Temp:    98 F (36.7 C)  TempSrc:    Oral  SpO2: 99% 100% 99% 100%  Weight:      Height:        General: Pt is alert, awake, not in acute distress Cardiovascular: RRR, S1/S2 +, no rubs, no gallops Respiratory: CTA bilaterally,  no wheezing, no rhonchi Abdominal: Soft, NT, ND, bowel sounds + Extremities: no edema, no cyanosis    The results of significant diagnostics from this hospitalization (including imaging, microbiology, ancillary and laboratory) are listed below for reference.     Microbiology: Recent Results (from the past 240 hour(s))  CSF culture     Status: None   Collection Time: 04/25/20  4:50 PM   Specimen: CSF; Cerebrospinal Fluid  Result Value Ref Range Status   Specimen Description CSF  Final   Special Requests NONE  Final   Gram Stain   Final    ABUNDANT WBC PRESENT,BOTH PMN AND MONONUCLEAR NO ORGANISMS SEEN    Culture   Final    NO GROWTH Performed at [facility], [address] [place], [place] [address]    Report Status 04/29/2020 FINAL  Final  Culture, fungus without smear     Status: None (Preliminary result)   Collection Time: 04/25/20  4:50 PM   Specimen: CSF; Cerebrospinal Fluid  Result Value Ref Range Status   Specimen Description CSF  Final   Special Requests NONE  Final   Culture   Final    NO FUNGUS ISOLATED AFTER 8 DAYS Performed at [facility], [place] [address], [place], [place] [address]    Report Status PENDING  Incomplete  Anaerobic culture     Status: None   Collection Time: 04/25/20  8:00 PM   Specimen: CSF; Cerebrospinal Fluid  Result Value Ref Range Status   Specimen Description CSF TUBE 3  Final   Special Requests NONE  Final   Gram Stain   Final    ABUNDANT WBC PRESENT,BOTH PMN AND MONONUCLEAR NO ORGANISMS SEEN    Culture   Final    NO ANAEROBES ISOLATED Performed at [facility], [place] [address] [place], [place]  [address]    Report Status 05/02/2020 FINAL  Final  Culture, blood (routine x 2)     Status: None   Collection Time: 04/26/20 12:59 AM   Specimen: BLOOD  Result Value Ref Range Status   Specimen Description BLOOD RIGHT ARM  Final   Special Requests   Final    BOTTLES DRAWN AEROBIC AND ANAEROBIC Blood Culture adequate  volume   Culture   Final    NO GROWTH 5 DAYS Performed at [facility], [address] [place], [place] [address]    Report Status 05/01/2020 FINAL  Final  Culture, blood (routine x 2)     Status: None   Collection Time: 04/26/20  1:04 AM   Specimen: BLOOD  Result Value Ref Range Status   Specimen Description BLOOD RIGHT HAND  Final   Special Requests   Final    BOTTLES DRAWN AEROBIC AND ANAEROBIC Blood Culture adequate volume   Culture   Final    NO GROWTH 5 DAYS Performed at [facility], [place] [address], [place], [place] [address]    Report Status 05/01/2020 FINAL  Final  Acid Fast Smear (AFB)     Status: None   Collection Time: 04/30/20 10:13 AM   Specimen: Bronchial Alveolar Lavage; Respiratory  Result Value Ref Range Status   AFB Specimen Processing Concentration  Final   Acid Fast Smear Negative  Final    Comment: (NOTE) Performed At: [facility] [address], [place] [id number] Rush Farmer MD QQ:[id number]    Source (AFB) BRONCHIAL ALVEOLAR LAVAGE  Final    Comment: Performed at [facility], [place] [address], [place], [place] [address]  Aerobic/Anaerobic Culture (surgical/deep wound)     Status: None   Collection Time: 04/30/20 10:13 AM   Specimen: Bronchoalveolar Lavage  Result Value Ref Range Status   Specimen Description BRONCHIAL ALVEOLAR LAVAGE  Final   Special Requests RIGHT MIDDLE LOBE  Final   Gram Stain   Final    MODERATE WBC PRESENT,BOTH PMN AND MONONUCLEAR NO ORGANISMS SEEN    Culture   Final    RARE Normal respiratory flora-no Staph aureus or Pseudomonas seen NO ANAEROBES ISOLATED Performed at [facility], [place] [address], [place], [place] [address]    Report Status 05/05/2020 FINAL  Final     Labs: BNP (last 3 results) No results for input(s): BNP in the last 8760 hours. Basic Metabolic Panel: Recent Labs  Lab 04/29/20 1122 04/29/20 1122 04/30/20 0852 05/01/20 0806 05/02/20 0509  05/03/20 0201 05/04/20 0443  NA 132*  --   --  132* 133* 132* 136  K 4.5  --   --  4.8 4.1 4.0 4.4  CL 96*  --   --  97* 101 102 104  CO2 27  --   --  _0 GLUCOSE 110*  --   --  105* 108* 133* 117*  BUN 9  --   --  _1 5*  CREATININE 0.91  --   --  0.77 0.78 0.65 0.65  CALCIUM 9.5  --   --  8.8* 8.7* 8.6* 8.9  MG 2.0   < > 2.0 2.0 2.0 1.8 1.9   < > = values in this interval not displayed.   Liver Function Tests: Recent Labs  Lab 04/29/20 1122  AST 13*  ALT 25  ALKPHOS 36*  BILITOT 1.0  PROT 6.8  ALBUMIN 3.6   No  results for input(s): LIPASE, AMYLASE in the last 168 hours. No results for input(s): AMMONIA in the last 168 hours. CBC: Recent Labs  Lab 05/01/20 0806 05/02/20 0509 05/03/20 0201 05/04/20 0443  WBC 5.2 5.3 3.5* 4.4  HGB 13.9 12.6* 11.6* 11.9*  HCT 40.7 37.4* 34.7* 36.2*  MCV 90.2 89.9 89.9 90.7  PLT 244 212 186 203   Cardiac Enzymes: No results for input(s): CKTOTAL, CKMB, CKMBINDEX, TROPONINI in the last 168 hours. BNP: Invalid input(s): POCBNP CBG: No results for input(s): GLUCAP in the last 168 hours. D-Dimer No results for input(s): DDIMER in the last 72 hours. Hgb A1c No results for input(s): HGBA1C in the last 72 hours. Lipid Profile No results for input(s): CHOL, HDL, LDLCALC, TRIG, CHOLHDL, LDLDIRECT in the last 72 hours. Thyroid function studies No results for input(s): TSH, T4TOTAL, T3FREE, THYROIDAB in the last 72 hours.  Invalid input(s): FREET3 Anemia work up No results for input(s): VITAMINB12, FOLATE, FERRITIN, TIBC, IRON, RETICCTPCT in the last 72 hours. Urinalysis    Component Value Date/Time   COLORURINE YELLOW 04/25/2020 1409   APPEARANCEUR CLEAR 04/25/2020 1409   LABSPEC 1.006 04/25/2020 1409   PHURINE 5.0 04/25/2020 1409   GLUCOSEU NEGATIVE 04/25/2020 1409   HGBUR NEGATIVE 04/25/2020 1409   BILIRUBINUR NEGATIVE 04/25/2020 1409   KETONESUR NEGATIVE 04/25/2020 1409   PROTEINUR NEGATIVE 04/25/2020 1409    NITRITE NEGATIVE 04/25/2020 1409   LEUKOCYTESUR NEGATIVE 04/25/2020 1409   Sepsis Labs Invalid input(s): PROCALCITONIN,  WBC,  LACTICIDVEN Microbiology Recent Results (from the past 240 hour(s))  CSF culture     Status: None   Collection Time: 04/25/20  4:50 PM   Specimen: CSF; Cerebrospinal Fluid  Result Value Ref Range Status   Specimen Description CSF  Final   Special Requests NONE  Final   Gram Stain   Final    ABUNDANT WBC PRESENT,BOTH PMN AND MONONUCLEAR NO ORGANISMS SEEN    Culture   Final    NO GROWTH Performed at [facility], [place] [address] [place], [place] [address]    Report Status 04/29/2020 FINAL  Final  Culture, fungus without smear     Status: None (Preliminary result)   Collection Time: 04/25/20  4:50 PM   Specimen: CSF; Cerebrospinal Fluid  Result Value Ref Range Status   Specimen Description CSF  Final   Special Requests NONE  Final   Culture   Final    NO FUNGUS ISOLATED AFTER 8 DAYS Performed at [facility], [place] [address] [place], [place] [address]    Report Status PENDING  Incomplete  Anaerobic culture     Status: None   Collection Time: 04/25/20  8:00 PM   Specimen: CSF; Cerebrospinal Fluid  Result Value Ref Range Status   Specimen Description CSF TUBE 3  Final   Special Requests NONE  Final   Gram Stain   Final    ABUNDANT WBC PRESENT,BOTH PMN AND MONONUCLEAR NO ORGANISMS SEEN    Culture   Final    NO ANAEROBES ISOLATED Performed at [facility], [place] [address], [place], [place] [address]    Report Status 05/02/2020 FINAL  Final  Culture, blood (routine x 2)     Status: None   Collection Time: 04/26/20 12:59 AM   Specimen: BLOOD  Result Value Ref Range Status   Specimen Description BLOOD RIGHT ARM  Final   Special Requests   Final    BOTTLES DRAWN AEROBIC AND ANAEROBIC Blood Culture adequate  volume   Culture   Final    NO GROWTH 5 DAYS Performed at [facility], [place] [address], [place], [place] [address]     Report Status 05/01/2020 FINAL  Final  Culture, blood (routine x 2)     Status: None   Collection Time: 04/26/20  1:04 AM   Specimen: BLOOD  Result Value Ref Range Status   Specimen Description BLOOD RIGHT HAND  Final   Special Requests   Final    BOTTLES DRAWN AEROBIC AND ANAEROBIC Blood Culture adequate volume   Culture   Final    NO GROWTH 5 DAYS Performed at [facility], [place] [address], [place], [place] [address]    Report Status 05/01/2020 FINAL  Final  Acid Fast Smear (AFB)     Status: None   Collection Time: 04/30/20 10:13 AM   Specimen: Bronchial Alveolar Lavage; Respiratory  Result Value Ref Range Status   AFB Specimen Processing Concentration  Final   Acid Fast Smear Negative  Final    Comment: (NOTE) Performed At: [facility] [place], [place] [id number] Rush Farmer MD MN:[id number]    Source (AFB) BRONCHIAL ALVEOLAR LAVAGE  Final    Comment: Performed at [facility], [place] [address], [place], [place] [address]  Aerobic/Anaerobic Culture (surgical/deep wound)     Status: None   Collection Time: 04/30/20 10:13 AM   Specimen: Bronchoalveolar Lavage  Result Value Ref Range Status   Specimen Description BRONCHIAL ALVEOLAR LAVAGE  Final   Special Requests RIGHT MIDDLE LOBE  Final   Gram Stain   Final    MODERATE WBC PRESENT,BOTH PMN AND MONONUCLEAR NO ORGANISMS SEEN    Culture   Final    RARE Normal respiratory flora-no Staph aureus or Pseudomonas seen NO ANAEROBES ISOLATED Performed at [facility], [place] [address] [place], [place] [address]    Report Status 05/05/2020 FINAL  Final    Please note: You were cared for by a hospitalist during your hospital stay. Once you are discharged, your primary care physician will handle any further medical issues. Please note that NO REFILLS for any discharge medications will be authorized once you are discharged, as it is imperative that you return to your primary care physician (or  establish a relationship with a primary care physician if you do not have one) for your post hospital discharge needs so that they can reassess your need for medications and monitor your lab values.    Time coordinating discharge: 40 minutes  SIGNED:   Shelly Coss, MD  [facility] 05/05/2020, 12:43 PM Pager [phone]  If 7PM-7AM, please contact night-coverage [email or web address] Password [email or web address]

## 2020-05-05 NOTE — TOC Transition Note (Signed)
Transition of Care [facility]) - CM/SW Discharge Note   Patient Details  Name: Aaron Chapman MRN: [id number] Date of Birth: 03-Feb-1976  Transition of Care [facility]) CM/SW Contact:  Pollie Friar, RN Phone Number: 05/05/2020, 1:05 PM   Clinical Narrative:    Pt discharging home with family. Pt without insurance. CM met with the patient using the interpreter and he was in agreement with getting set up with one of the [facility]. Information on his AVS. Pt can then also use [facility] pharmacy for medication assistance.  Pt has transport home.    Final next level of care: Home/Self Care Barriers to Discharge: Inadequate or no insurance, Barriers Unresolved (comment)   Patient Goals and CMS Choice        Discharge Placement                       Discharge Plan and Services                                     Social Determinants of Health (SDOH) Interventions     Readmission Risk Interventions No flowsheet data found.

## 2020-05-05 NOTE — Plan of Care (Signed)
  Problem: Health Behavior/Discharge Planning: Goal: Ability to manage health-related needs will improve Outcome: Progressing   Problem: Clinical Measurements: Goal: Ability to maintain clinical measurements within normal limits will improve Outcome: Progressing   Problem: Clinical Measurements: Goal: Ability to maintain clinical measurements within normal limits will improve Outcome: Progressing   Problem: Health Behavior/Discharge Planning: Goal: Ability to manage health-related needs will improve Outcome: Progressing

## 2020-05-05 NOTE — Evaluation (Signed)
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 He also recently been diagnosed with COVID-19 8/14. He has a > 1 month history of fevers pre-dating his COVID-19 diagnosis. As COVID came and went so did his fevers, but they returned with headaches and SOB some time after. Associated rash on the trunk and extremities. 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 Neuro and infectious disease consults inconclusive--differentials include infection and lymphoma. Repeat MRI  concerns of cerebritis/mycotic aneurysm.  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  Clinical Impression   Patient evaluated by Physical Therapy with no further acute PT needs identified. Amazingly moving well (independently) after prolonged hospitalization. PT is signing off. Thank you for this referral.     Follow Up Recommendations No PT follow up    Equipment Recommendations  None recommended by PT    Recommendations for Other Services       Precautions / Restrictions Precautions Precautions: None      Mobility  Bed Mobility Overal bed mobility: Independent             General bed mobility comments: mild dizziness that resolved in <1 minutes  Transfers Overall transfer level: Independent Equipment used: None                Ambulation/Gait Ambulation/Gait assistance: Land (Feet): 25 Feet Assistive device: None Gait Pattern/deviations: WFL(Within Functional Limits)        Stairs Stairs:  (pt deferred offer to practice)          Wheelchair Mobility    Modified Rankin (Stroke Patients Only)       Balance  Overall balance assessment: Independent (static standing feet together, no difficulty)                                           Pertinent Vitals/Pain Pain Assessment: No/denies pain    Home Living Family/patient expects to be discharged to:: Private residence Living Arrangements: Spouse/significant other Available Help at Discharge: Family   Home Access: Stairs to enter Entrance Stairs-Rails: Doctor, general practice of Steps: 12 Home Layout: One level        Prior Function Level of Independence: Independent               Hand Dominance        Extremity/Trunk Assessment   Upper Extremity Assessment Upper Extremity Assessment: Overall WFL for tasks assessed    Lower Extremity Assessment Lower Extremity Assessment: Overall WFL for tasks assessed    Cervical / Trunk Assessment Cervical / Trunk Assessment: Normal  Communication   Communication: Prefers language other than English (Spanish-speaking; using Google translate on his phone)  Cognition Arousal/Alertness: Awake/alert Behavior During Therapy: WFL for tasks assessed/performed Overall Cognitive Status: Within Functional Limits for tasks assessed                                        General Comments General comments (skin integrity, edema, etc.): Wife present and agrees he is close  to his baseline, considering multiple days hospitalized    Exercises     Assessment/Plan    PT Assessment Patent does not need any further PT services  PT Problem List         PT Treatment Interventions      PT Goals (Current goals can be found in the Care Plan section)  Acute Rehab PT Goals PT Goal Formulation: All assessment and education complete, DC therapy    Frequency     Barriers to discharge        Co-evaluation               AM-PAC PT "6 Clicks" Mobility  Outcome Measure Help needed turning from your back to your side while in a flat bed without  using bedrails?: None Help needed moving from lying on your back to sitting on the side of a flat bed without using bedrails?: None Help needed moving to and from a bed to a chair (including a wheelchair)?: None Help needed standing up from a chair using your arms (e.g., wheelchair or bedside chair)?: None Help needed to walk in hospital room?: None Help needed climbing 3-5 steps with a railing? : None 6 Click Score: 24    End of Session   Activity Tolerance: Patient tolerated treatment well Patient left: in bed;with family/visitor present (seated EOB to get dressed with wife's assist) Nurse Communication: Mobility status;Other (comment) (no PT/DME needs) PT Visit Diagnosis: Dizziness and giddiness (R42)    Time: 1547-1600 PT Time Calculation (min) (ACUTE ONLY): 13 min   Charges:   PT Evaluation $PT Eval Low Complexity: 1 Low           Jerolyn Center, PT Pager [phone]   Zena Amos 05/05/2020, 5:09 PM

## 2020-05-06 ENCOUNTER — Telehealth: Payer: Self-pay | Admitting: Pulmonary Disease

## 2020-05-06 ENCOUNTER — Institutional Professional Consult (permissible substitution): Payer: Self-pay | Admitting: Pulmonary Disease

## 2020-05-06 NOTE — Telephone Encounter (Signed)
Patient was scheduled for consult with Dr Isaiah Serge today, 05/06/20, at 0930.  Patient is post covid, fever hilar adenopathy, hospital follow up Consult. Dr Isaiah Serge had Patient cancelled 05/06/20, and request Patient be rescheduled in 1 month. ATC Patient this morning via Spanish interpreter. Message left through Spanish interpreter for Patient to contact office to reschedule consult with Dr Isaiah Serge.  Message routed to front desk pool/ High Risk pool to assist in getting Patient rescheduled.

## 2020-05-13 ENCOUNTER — Encounter: Payer: Self-pay | Admitting: Neurology

## 2020-05-13 ENCOUNTER — Ambulatory Visit: Payer: Self-pay | Admitting: Neurology

## 2020-05-13 ENCOUNTER — Telehealth: Payer: Self-pay | Admitting: Neurology

## 2020-05-13 ENCOUNTER — Other Ambulatory Visit: Payer: Self-pay

## 2020-05-13 VITALS — BP 122/79 | HR 87 | Ht 73.0 in | Wt 176.0 lb

## 2020-05-13 DIAGNOSIS — G039 Meningitis, unspecified: Secondary | ICD-10-CM

## 2020-05-13 DIAGNOSIS — R509 Fever, unspecified: Secondary | ICD-10-CM

## 2020-05-13 DIAGNOSIS — R519 Headache, unspecified: Secondary | ICD-10-CM

## 2020-05-13 DIAGNOSIS — G0490 Encephalitis and encephalomyelitis, unspecified: Secondary | ICD-10-CM

## 2020-05-13 NOTE — Telephone Encounter (Signed)
self pay order sent to GI. They will obtain the auth and reach out to the patient to schedule.  

## 2020-05-13 NOTE — Progress Notes (Signed)
[facility]  PATIENT: Aaron Chapman DOB: [birth year]  REFERRING DOCTOR OR PCP:  None SOURCE: Patient (through interpreter), notes from hospitalization, imaging and lab reports, multiple imaging studies personally reviewed.  _________________________________   HISTORICAL  CHIEF COMPLAINT:  Chief Complaint  Patient presents with  . New Patient (Initial Visit)    RM 12 with interpreter. ED f/u for cerebritis/mycotic aneurysm. At [facility] 04/25/20-05/05/20.     HISTORY OF PRESENT ILLNESS:  I had the pleasure of seeing your patient, Aaron Chapman, at [facility] for neurologic consultation regarding his noninfectious meningitis and abnormal brain MRI and cerebral aneurysm.  He is a 44 year old man from [place] who presented to the emergency room 04/21/2020 with fever of unknown origin.  Of note, he had had COVID-19 on August 14 and had been febrile for 15 days afterwards before being fever free for a couple weeks.  Along with the fever, he had headaches, shortness of breath, nonproductive cough and some chest pain.  He had a T-max of 103-104.  He developed a nonpruritic rash on the trunk and limbs.  Due to the residual symptoms from COVID-19, he had a CT scan of the chest on 04/10/2020 which showed right lower lobe nodule, left lower lobe nodule and mediastinal and bilateral hilar lymphadenopathy.  While in the emergency room 04/21/2020 for the fever, the blood work was fine.  QuantiFERON gold and blood cultures were performed (these were negative).  He was referred to infectious disease as an outpatient.  He re-presented to the emergency room with worsening fever and headache on 04/25/2020.  He was admitted for further evaluation.  Lumbar puncture was abnormal with greatly elevated protein (357) and reduced glucose (23).  Additionally, MRI of the brain showed some leptomeningeal enhancement that was worse at the cerebellopontine angle on  the right.  Some patchy parenchymal enhancement was noted in the right middle cerebellar peduncle.  The MRI showing prominent basilar meningitis combined with the LP were worrisome for tuberculosis meningitis.  However, AFB was negative.  Later in the admission, he had a bronchoscopy.  Pathology of the lymph node showed granulomatous type changes but no lymphoma.  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  This was confirmed with conventional angiography on 05/05/2020.  It is small measuring 2.7 x 1.6 mm.  He was started on steroids while in the hospital.  Initially he was placed on medication for tuberculosis but as more lab work return that was discontinued.  He continued on dexamethasone 4 mg 3 times daily upon discharge.  He moved to the [place] from [place] January 2021.  He had received the COVID-19 [employer] vaccination about 3 months before his COVID-19 infection.  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  He has no history of tuberculosis.  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  He had surgery for this in 2015.  Currently, he is having dizziness, weakness (muscle fatigue), worse in the mornings.    He is slowly getting better.    He no longer has headaches and fevers.     He denies any new facial weakness or numbness but has some residual numbness from his trigeminal neuralgia   REVIEW OF SYSTEMS: Constitutional: Fevers and fatigue as above  eyes: No visual changes, double vision, eye pain Ear, nose and throat: No hearing  loss, ear pain, nasal congestion, sore throat Cardiovascular: No chest pain, palpitations Respiratory: No shortness of breath at rest or with exertion.   No wheezes GastrointestinaI: No nausea, vomiting, diarrhea, abdominal pain, fecal incontinence Genitourinary: No dysuria, urinary  retention or frequency.  No nocturia. Musculoskeletal: No neck pain, back pain Integumentary: He has a mild rash that is nonpruritic Neurological: as above Psychiatric: No depression at this time.  No anxiety Endocrine: No palpitations, diaphoresis, change in appetite, change in weigh or increased thirst Hematologic/Lymphatic: No anemia, purpura, petechiae. Allergic/Immunologic: No itchy/runny eyes, nasal congestion, recent allergic reactions, rashes  ALLERGIES: Allergies  Allergen Reactions  . Aspirin Swelling    Facial swelling    HOME MEDICATIONS:  Current Outpatient Medications:  .  Ascorbic Acid (VITAMIN C) 1000 MG tablet, Take 1,000 mg by mouth daily., Disp: , Rfl:  .  dexamethasone (DECADRON) 4 MG tablet, Take 1 tablet (4 mg total) by mouth every 8 (eight) hours., Disp: 90 tablet, Rfl: 1 .  ibuprofen (ADVIL) 800 MG tablet, Take 800 mg by mouth every 8 (eight) hours as needed for fever., Disp: , Rfl:  .  pantoprazole (PROTONIX) 40 MG tablet, Take 1 tablet (40 mg total) by mouth daily., Disp: 30 tablet, Rfl: 1 .  promethazine-dextromethorphan (PROMETHAZINE-DM) 6.25-15 MG/5ML syrup, Take 5 mLs by mouth 4 (four) times daily as needed for cough. (Patient taking differently: Take 5 mLs by mouth 2 (two) times daily as needed for cough. ), Disp: 120 mL, Rfl: 0 .  VITAMIN A PO, Take 1 capsule by mouth daily., Disp: , Rfl:   PAST MEDICAL HISTORY: Past Medical History:  Diagnosis Date  . COVID-19    positive 03/14/20 - tested at a clinic in [place] - unsure of name of clinic  . Dyspnea   . History of kidney stones   . Lung nodules   . Pneumonia     PAST SURGICAL HISTORY: Past Surgical History:  Procedure Laterality Date  . BRAIN SURGERY  2015  . BRONCHIAL NEEDLE ASPIRATION BIOPSY  04/30/2020   Procedure: BRONCHIAL NEEDLE ASPIRATION BIOPSIES;  Surgeon: Lupita LeashMcQuaid, Douglas B, MD;  [facility];  Service: Cardiopulmonary;;  . BRONCHIAL WASHINGS  04/30/2020   Procedure:  BRONCHIAL WASHINGS;  Surgeon: Lupita LeashMcQuaid, Douglas B, MD;  [facility];  Service: Cardiopulmonary;;  . ENDOBRONCHIAL ULTRASOUND N/A 04/30/2020   Procedure: ENDOBRONCHIAL ULTRASOUND;  Surgeon: Lupita LeashMcQuaid, Douglas B, MD;  [facility];  Service: Cardiopulmonary;  Laterality: N/A;  . IR 3D INDEPENDENT WKST  05/05/2020  . IR ANGIO INTRA EXTRACRAN SEL COM CAROTID INNOMINATE BILAT MOD SED  05/05/2020  . IR ANGIO VERTEBRAL SEL VERTEBRAL UNI L MOD SED  05/05/2020  . IR ANGIO VERTEBRAL SEL VERTEBRAL UNI R MOD SED  05/04/2020  . IR US GUIDE VASC ACCESS RIGHT  05/04/2020  . KIDNEY STONE SURGERY     removal  . TONSILLECTOMY    . VIDEO BRONCHOSCOPY N/A 04/30/2020   Procedure: VIDEO BRONCHOSCOPY WITHOUT FLUORO;  Surgeon: Lupita LeashMcQuaid, Douglas B, MD;  [facility];  Service: Cardiopulmonary;  Laterality: N/A;  . WISDOM TOOTH EXTRACTION      FAMILY HISTORY: Family History  Problem Relation Age of Onset  . Hypertension Mother   . Diabetes Mother   . Pneumonia Father   . Bladder Cancer Father     SOCIAL HISTORY:  Social History   Socioeconomic History  . Marital status: Married    Spouse name: Elayne SnareMarie Chirinos  . Number of children: 1  . Years of education:  Primary school  . Highest education level: Not on file  Occupational History  . Occupation: Holiday representative  Tobacco Use  . Smoking status: Never Smoker  . Smokeless tobacco: Never Used  Vaping Use  . Vaping Use: Never assessed  Substance and Sexual Activity  . Alcohol use: Yes    Comment: occasional   . Drug use: Not Currently  . Sexual activity: Not on file  Other Topics Concern  . Not on file  Social History Narrative   Caffeine use: daily (coffee)   Right handed    Social Determinants of Health   Financial Resource Strain:   . Difficulty of Paying Living Expenses: Not on file  Food Insecurity:   . Worried About Programme researcher, broadcasting/film/video in the Last Year: Not on file  . Ran Out of Food in the Last Year: Not on file    Transportation Needs:   . Lack of Transportation (Medical): Not on file  . Lack of Transportation (Non-Medical): Not on file  Physical Activity:   . Days of Exercise per Week: Not on file  . Minutes of Exercise per Session: Not on file  Stress:   . Feeling of Stress : Not on file  Social Connections:   . Frequency of Communication with Friends and Family: Not on file  . Frequency of Social Gatherings with Friends and Family: Not on file  . Attends Religious Services: Not on file  . Active Member of Clubs or Organizations: Not on file  . Attends Banker Meetings: Not on file  . Marital Status: Not on file  Intimate Partner Violence:   . Fear of Current or Ex-Partner: Not on file  . Emotionally Abused: Not on file  . Physically Abused: Not on file  . Sexually Abused: Not on file     PHYSICAL EXAM  Vitals:   05/13/20 0857  BP: 122/79  Pulse: 87  Weight: 176 lb (79.8 kg)  Height: 6\' 1"  (1.854 m)    Body mass index is 23.22 kg/m.   General: The patient is well-developed and well-nourished and in no acute distress  HEENT:  Head is [place]/AT.  Sclera are anicteric.  Funduscopic exam shows normal optic discs and retinal vessels.  Neck: No carotid bruits are noted.  The neck is nontender.  Cardiovascular: The heart has a regular rate and rhythm with a normal S1 and S2. There were no murmurs, gallops or rubs.    Skin: He has a mild rash predominantly in the chest and abdomen/flanks..  Musculoskeletal:  Back is nontender  Neurologic Exam  Mental status: The patient is alert and oriented x 3 at the time of the examination. The patient has apparent normal recent and remote memory, with an apparently normal attention span and concentration ability.   Speech is normal.  Cranial nerves: Extraocular movements are full. Pupils are equal, round, and reactive to light and accomodation.   Facial symmetry is present. There is good facial sensation to soft touch bilaterally   .Facial strength is normal.  Trapezius and sternocleidomastoid strength is normal. No dysarthria is noted.  The tongue is midline, and the patient has symmetric elevation of the soft palate. No obvious hearing deficits are noted.  Motor:  Muscle bulk is normal.   Tone is normal. Strength is  5 / 5 in all 4 extremities.   Sensory: Sensory testing is intact to pinprick, soft touch and vibration sensation in all 4 extremities.  Coordination: Cerebellar testing reveals good finger-nose-finger and heel-to-shin  bilaterally.  Gait and station: Station is normal.   Gait is normal. Tandem gait is mildly wide. Romberg is negative.   Reflexes: Deep tendon reflexes are symmetric and normal in arms, 3 at the knees and 2 at the ankles..   Plantar responses are flexor.     DIAGNOSTIC DATA (LABS, IMAGING, TESTING) - I reviewed patient records, labs, notes, testing and imaging myself where available.  Lab Results  Component Value Date   WBC 4.4 05/04/2020   HGB 11.9 (L) 05/04/2020   HCT 36.2 (L) 05/04/2020   MCV 90.7 05/04/2020   PLT 203 05/04/2020      Component Value Date/Time   NA 136 05/04/2020 0443   K 4.4 05/04/2020 0443   CL 104 05/04/2020 0443   CO2 25 05/04/2020 0443   GLUCOSE 117 (H) 05/04/2020 0443   BUN 5 (L) 05/04/2020 0443   CREATININE 0.65 05/04/2020 0443   CALCIUM 8.9 05/04/2020 0443   PROT 6.8 04/29/2020 1122   ALBUMIN 3.6 04/29/2020 1122   ALBUMIN 3.9 (L) 04/25/2020 2000   AST 13 (L) 04/29/2020 1122   ALT 25 04/29/2020 1122   ALKPHOS 36 (L) 04/29/2020 1122   BILITOT 1.0 04/29/2020 1122   GFRNONAA >60 05/04/2020 0443   GFRAA >60 05/04/2020 0443       ASSESSMENT AND PLAN  Meningitis - Plan: Pan-ANCA, Angiotensin converting enzyme, MR BRAIN W WO CONTRAST, CBC with Differential/Platelet  Bilateral headaches - Plan: Pan-ANCA, Angiotensin converting enzyme, MR BRAIN W WO CONTRAST, CBC with Differential/Platelet  Fever and chills  Brain inflammation   In  summary, Mr. 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  Although this finding is very concerning for a tuberculous meningitis, there was no evidence of TB on several different tests.  Other possibilities or sarcoid though the ACE level was negative.  Though uncommon, vasculitis such as Wegener's granulomatosis could give a finding like this.  I will check the pan- ANCA and recheck ACE.  He is improving and no longer has fevers and headaches.  Hopefully this is a monophasic illness so that he would not require long-term immunomodulation though without a firm diagnosis it is difficult to know the optimal treatment.  For now, he has done well on steroids and I will continue him at dexamethasone 4 mg 3 times daily for the rest of the month.  On November 1 he will go down to 4 mg twice a day.  I will recheck an MRI of the brain with and without contrast in the middle of November and compared with his previous 1 so that we can determine if the inflammation has resolved.  If it has, I will continue a slow steroid taper.  If symptoms recur or there is not clearance on the MRI, we may need to go back up on steroids and we would need to consider a steroid sparing agent if he is going to require long-term immunomodulation.  They are advised to call if there are any new or worsening symptoms.   Tangee Marszalek A. Epimenio Foot, MD, [facility] 05/13/2020, 12:57 PM Certified in Neurology, Clinical Neurophysiology, Sleep Medicine and Neuroimaging  [facility] [address] [place], [place] [address] [phone]

## 2020-05-15 LAB — CBC WITH DIFFERENTIAL/PLATELET
Basophils Absolute: 0.1 10*3/uL (ref 0.0–0.2)
Basos: 0 %
EOS (ABSOLUTE): 0 10*3/uL (ref 0.0–0.4)
Eos: 0 %
Hematocrit: 41.8 % (ref 37.5–51.0)
Hemoglobin: 14.3 g/dL (ref 13.0–17.7)
Immature Grans (Abs): 0.5 10*3/uL — ABNORMAL HIGH (ref 0.0–0.1)
Immature Granulocytes: 4 %
Lymphocytes Absolute: 0.5 10*3/uL — ABNORMAL LOW (ref 0.7–3.1)
Lymphs: 5 %
MCH: 31.7 pg (ref 26.6–33.0)
MCHC: 34.2 g/dL (ref 31.5–35.7)
MCV: 93 fL (ref 79–97)
Monocytes Absolute: 0.8 10*3/uL (ref 0.1–0.9)
Monocytes: 6 %
Neutrophils Absolute: 9.9 10*3/uL — ABNORMAL HIGH (ref 1.4–7.0)
Neutrophils: 85 %
Platelets: 320 10*3/uL (ref 150–450)
RBC: 4.51 x10E6/uL (ref 4.14–5.80)
RDW: 13.7 % (ref 11.6–15.4)
WBC: 11.7 10*3/uL — ABNORMAL HIGH (ref 3.4–10.8)

## 2020-05-15 LAB — PAN-ANCA
ANCA Proteinase 3: <3.5 U/mL (ref 0.0–3.5)
Atypical pANCA: <1:20 {titer}
C-ANCA: <1:20 {titer}
Myeloperoxidase Ab: <9 U/mL (ref 0.0–9.0)
P-ANCA: <1:20 {titer}

## 2020-05-15 LAB — ANGIOTENSIN CONVERTING ENZYME: Angio Convert Enzyme: 35 U/L (ref 14–82)

## 2020-05-17 LAB — CULTURE, FUNGUS WITHOUT SMEAR

## 2020-06-02 LAB — FUNGUS CULTURE RESULT

## 2020-06-02 LAB — FUNGAL ORGANISM REFLEX

## 2020-06-02 LAB — FUNGUS CULTURE WITH STAIN

## 2020-06-03 ENCOUNTER — Other Ambulatory Visit: Payer: Self-pay

## 2020-06-03 ENCOUNTER — Ambulatory Visit (INDEPENDENT_AMBULATORY_CARE_PROVIDER_SITE_OTHER): Payer: Self-pay | Admitting: Pulmonary Disease

## 2020-06-03 ENCOUNTER — Encounter: Payer: Self-pay | Admitting: Pulmonary Disease

## 2020-06-03 VITALS — BP 120/68 | HR 120 | Temp 97.8°F | Ht 72.0 in | Wt 175.2 lb

## 2020-06-03 DIAGNOSIS — D869 Sarcoidosis, unspecified: Secondary | ICD-10-CM

## 2020-06-03 DIAGNOSIS — Z8616 Personal history of COVID-19: Secondary | ICD-10-CM

## 2020-06-03 DIAGNOSIS — R599 Enlarged lymph nodes, unspecified: Secondary | ICD-10-CM

## 2020-06-03 DIAGNOSIS — R21 Rash and other nonspecific skin eruption: Secondary | ICD-10-CM

## 2020-06-03 NOTE — Progress Notes (Signed)
Synopsis: Referred in November 2021 for adenopathy by Burnadette PopAdhikari, Amrit, MD  Subjective:   PATIENT ID: Aaron Bussingafael Alberto Chapman GENDER: male DOB: 1975/09/15, MRN: [id number]  Chief Complaint  Patient presents with   Consult    pt is here to go over ct    This is a 44 year old gentleman history of COVID-19.  Patient had recent hospitalization in September 2021.  Patient had Covid in August.  He had CSF concerning for meningitis.  Unclear etiology for meningitis with question of neurosarcoidosis.  Patient also underwent cerebral angiogram by Dr. Corliss Skainseveshwar.  Discharged from the hospital on 05/05/2020.  Discharge summary reviewed from Dr. Damian LeavellAdikari.  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  Tuberculosis was ruled out quantiferron negative CSF studies with AFB negative HIV negative.  Hilar nodes with granulomas per pathology status post bronchoscopy with endobronchial ultrasound and transbronchial needle aspirations of station 7 lymph node.  OV 06/03/2020: Patient seen today for follow-up after recent hospitalization.  Presents today with wife and interpreter.  Currently on dexamethasone.  Starting November 1 began tapering the dexamethasone.  He has close follow-up with neurology.  Also has a planned MRI.  He has had a rash for some time they are concerned about this rash is a feels not been going away and possibly getting worse.  Started on the back upper extremities thorax papular rash.   Past Medical History:  Diagnosis Date   COVID-19    positive 03/14/20 - tested at a clinic in [place] - unsure of name of clinic   Dyspnea    History of kidney stones    Lung nodules    Pneumonia      Family History  Problem Relation Age of Onset   Hypertension Mother    Diabetes Mother    Pneumonia Father    Bladder  Cancer Father      Past Surgical History:  Procedure Laterality Date   BRAIN SURGERY  2015   BRONCHIAL NEEDLE ASPIRATION BIOPSY  04/30/2020   Procedure: BRONCHIAL NEEDLE ASPIRATION BIOPSIES;  Surgeon: Lupita LeashMcQuaid, Douglas B, MD;  [facility];  Service: Cardiopulmonary;;   BRONCHIAL WASHINGS  04/30/2020   Procedure: BRONCHIAL WASHINGS;  Surgeon: Lupita LeashMcQuaid, Douglas B, MD;  [facility];  Service: Cardiopulmonary;;   ENDOBRONCHIAL ULTRASOUND N/A 04/30/2020   Procedure: ENDOBRONCHIAL ULTRASOUND;  Surgeon: Lupita LeashMcQuaid, Douglas B, MD;  Location: [facility] ENDOSCOPY;  Service: Cardiopulmonary;  Laterality: N/A;   IR 3D INDEPENDENT WKST  05/05/2020   IR ANGIO INTRA EXTRACRAN SEL COM CAROTID INNOMINATE BILAT MOD SED  05/05/2020   IR ANGIO VERTEBRAL SEL VERTEBRAL UNI L MOD SED  05/05/2020   IR ANGIO VERTEBRAL SEL VERTEBRAL UNI R MOD SED  05/04/2020   IR US GUIDE VASC ACCESS RIGHT  05/04/2020   KIDNEY STONE SURGERY     removal   TONSILLECTOMY     VIDEO BRONCHOSCOPY N/A 04/30/2020   Procedure: VIDEO BRONCHOSCOPY WITHOUT FLUORO;  Surgeon: Lupita LeashMcQuaid, Douglas B, MD;  [facility];  Service: Cardiopulmonary;  Laterality: N/A;   WISDOM TOOTH EXTRACTION      Social History   Socioeconomic History   Marital status: Married    Spouse name: Hilda LiasMarie Chirinos   Number of children: 1   Years of education: Primary school   Highest education level: Not on file  Occupational History   Occupation: Holiday representativeConstruction  Tobacco  Use   Smoking status: Never Smoker   Smokeless tobacco: Never Used  Vaping Use   Vaping Use: Never assessed  Substance and Sexual Activity   Alcohol use: Yes    Comment: occasional    Drug use: Not Currently   Sexual activity: Not on file  Other Topics Concern   Not on file  Social History Narrative   Caffeine use: daily (coffee)   Right handed    Social Determinants of Health   Financial Resource Strain:    Difficulty of Paying Living Expenses: Not  on file  Food Insecurity:    Worried About Running Out of Food in the Last Year: Not on file   Ran Out of Food in the Last Year: Not on file  Transportation Needs:    Lack of Transportation (Medical): Not on file   Lack of Transportation (Non-Medical): Not on file  Physical Activity:    Days of Exercise per Week: Not on file   Minutes of Exercise per Session: Not on file  Stress:    Feeling of Stress : Not on file  Social Connections:    Frequency of Communication with Friends and Family: Not on file   Frequency of Social Gatherings with Friends and Family: Not on file   Attends Religious Services: Not on file   Active Member of Clubs or Organizations: Not on file   Attends Banker Meetings: Not on file   Marital Status: Not on file  Intimate Partner Violence:    Fear of Current or Ex-Partner: Not on file   Emotionally Abused: Not on file   Physically Abused: Not on file   Sexually Abused: Not on file     Allergies  Allergen Reactions   Aspirin Swelling    Facial swelling     Outpatient Medications Prior to Visit  Medication Sig Dispense Refill   Ascorbic Acid (VITAMIN C) 1000 MG tablet Take 1,000 mg by mouth daily.     dexamethasone (DECADRON) 4 MG tablet Take 1 tablet (4 mg total) by mouth every 8 (eight) hours. (Patient taking differently: Take 4 mg by mouth every 8 (eight) hours. Twice a day) 90 tablet 1   pantoprazole (PROTONIX) 40 MG tablet Take 1 tablet (40 mg total) by mouth daily. 30 tablet 1   VITAMIN A PO Take 1 capsule by mouth daily.     ibuprofen (ADVIL) 800 MG tablet Take 800 mg by mouth every 8 (eight) hours as needed for fever.     promethazine-dextromethorphan (PROMETHAZINE-DM) 6.25-15 MG/5ML syrup Take 5 mLs by mouth 4 (four) times daily as needed for cough. (Patient taking differently: Take 5 mLs by mouth 2 (two) times daily as needed for cough. ) 120 mL 0   No facility-administered medications prior to visit.     Review of Systems  Constitutional: Negative for chills, fever, malaise/fatigue and weight loss.  HENT: Negative for hearing loss, sore throat and tinnitus.   Eyes: Negative for blurred vision and double vision.  Respiratory: Negative for cough, hemoptysis, sputum production, shortness of breath, wheezing and stridor.   Cardiovascular: Negative for chest pain, palpitations, orthopnea, leg swelling and PND.  Gastrointestinal: Negative for abdominal pain, constipation, diarrhea, heartburn, nausea and vomiting.  Genitourinary: Negative for dysuria, hematuria and urgency.  Musculoskeletal: Negative for joint pain and myalgias.  Skin: Negative for itching and rash.  Neurological: Negative for dizziness, tingling, weakness and headaches.  Endo/Heme/Allergies: Negative for environmental allergies. Does not bruise/bleed easily.  Psychiatric/Behavioral: Negative for depression. The  patient is not nervous/anxious and does not have insomnia.   All other systems reviewed and are negative.    Objective:  Physical Exam Vitals reviewed.  Constitutional:      General: He is not in acute distress.    Appearance: He is well-developed.  HENT:     Head: Normocephalic and atraumatic.  Eyes:     General: No scleral icterus.    Conjunctiva/sclera: Conjunctivae normal.     Pupils: Pupils are equal, round, and reactive to light.  Neck:     Vascular: No JVD.     Trachea: No tracheal deviation.  Cardiovascular:     Rate and Rhythm: Normal rate and regular rhythm.     Heart sounds: Normal heart sounds. No murmur heard.   Pulmonary:     Effort: Pulmonary effort is normal. No tachypnea, accessory muscle usage or respiratory distress.     Breath sounds: Normal breath sounds. No stridor. No wheezing, rhonchi or rales.  Abdominal:     General: Bowel sounds are normal. There is no distension.     Palpations: Abdomen is soft.     Tenderness: There is no abdominal tenderness.  Musculoskeletal:         General: No tenderness.     Cervical back: Neck supple.  Lymphadenopathy:     Cervical: No cervical adenopathy.  Skin:    General: Skin is warm and dry.     Capillary Refill: Capillary refill takes less than 2 seconds.     Findings: Rash present.  Neurological:     Mental Status: He is alert and oriented to person, place, and time.  Psychiatric:        Behavior: Behavior normal.      Vitals:   06/03/20 1403  BP: 120/68  Pulse: (!) 120  Temp: 97.8 F (36.6 C)  TempSrc: Oral  SpO2: 98%  Weight: 175 lb 3.2 oz (79.5 kg)  Height: 6' (1.829 m)   98% on RA BMI Readings from Last 3 Encounters:  06/03/20 23.76 kg/m  05/13/20 23.22 kg/m  05/02/20 23.04 kg/m   Wt Readings from Last 3 Encounters:  06/03/20 175 lb 3.2 oz (79.5 kg)  05/13/20 176 lb (79.8 kg)  05/02/20 174 lb 9.7 oz (79.2 kg)     CBC    Component Value Date/Time   WBC 11.7 (H) 05/13/2020 1003   WBC 4.4 05/04/2020 0443   RBC 4.51 05/13/2020 1003   RBC 3.99 (L) 05/04/2020 0443   HGB 14.3 05/13/2020 1003   HCT 41.8 05/13/2020 1003   PLT 320 05/13/2020 1003   MCV 93 05/13/2020 1003   MCH 31.7 05/13/2020 1003   MCH 29.8 05/04/2020 0443   MCHC 34.2 05/13/2020 1003   MCHC 32.9 05/04/2020 0443   RDW 13.7 05/13/2020 1003   LYMPHSABS 0.5 (L) 05/13/2020 1003   MONOABS 0.6 04/25/2020 1118   EOSABS 0.0 05/13/2020 1003   BASOSABS 0.1 05/13/2020 1003   CSF: Results for NIVAN, MELENDREZ (MRN [id number]) as of 06/03/2020 14:20  Ref. Range 04/25/2020 20:00  Albumin CSF-mCnc Latest Ref Range: 10 - 45 mg/dL 786 (H)  Appearance, CSF Latest Ref Range: CLEAR  CLEAR  Glucose, CSF Latest Ref Range: 40 - 70 mg/dL 23 (LL)  RBC Count, CSF Latest Ref Range: 0 /cu mm 349 (H)  WBC, CSF Latest Ref Range: 0 - 5 /cu mm 106 (HH)  Segmented Neutrophils-CSF Latest Ref Range: 0 - 6 % 0  Lymphs, CSF Latest Ref Range: 40 - 80 %  93 (H)  Monocyte-Macrophage-Spinal Fluid Latest Ref Range: 15 - 45 % 7 (L)  Eosinophils, CSF  Latest Ref Range: 0 - 1 % 0  Color, CSF Latest Ref Range: COLORLESS  COLORLESS  Supernatant Unknown NOT INDICATED  IgG, CSF Latest Ref Range: 0.0 - 10.3 mg/dL 78.2 (H)  IgG/Alb Ratio, CSF Latest Ref Range: 0.00 - 0.25  0.29 (H)  CSF IgG Index Latest Ref Range: 0.0 - 0.7  1.1 (H)  Myelin Basic Protein Latest Ref Range: 0.0 - 4.7 ng/mL 3.0  Total  Protein, CSF Latest Ref Range: 15 - 45 mg/dL 956 (H)  Tube # Unknown 3    Chest Imaging: CT scan of the chest November 2021: Bilateral mediastinal adenopathy. The patient's images have been independently reviewed by me.    Pulmonary Functions Testing Results: No flowsheet data found.  FeNO:   Pathology:   04/30/2020 endobronchial ultrasound station 7 transbronchial needle aspirations, Dr. Kendrick Fries No malignant cells identified Cell block with granulomas present.  Echocardiogram:   Heart Catheterization:     Assessment & Plan:     ICD-10-CM   1. Adenopathy  R59.9 CT CHEST HIGH RESOLUTION  2. Papular rash  R21 Ambulatory referral to Dermatology  3. Sarcoidosis  D86.9   4. History of COVID-19  Z86.16    Discussion:  This is a 44 year old gentleman history of COVID-19, developed papular rash, adenopathy within the chest, inflammatory meningitis.  CSF results above reviewed.  Hospitalization discharge summary from October 2021 reviewed.  Neurology office note from 05/13/2020 Dr. Epimenio Foot reviewed.  Currently on dexamethasone tapering for an inflammatory meningitis.  His MRI did show leptomeningeal thickening and enhancement and an inflammatory lesion within the right cerebellopontine angle.  The EBUS bronchoscopy station 7 nodes did have granulomas present which could represent the diagnosis of sarcoidosis.  Plan: Continue tapering of steroids per neurology. Recommend noncontrasted CT of the chest in 6 months from prior for reevaluation of the mediastinal adenopathy. We will have this done as a high-resolution CT scan of the chest to  have a better look at the parenchyma to see if there is any additional changes.  Additional time spent reviewing hospitalization documentation, neurology documentation, procedural reports and pathology reports.   Current Outpatient Medications:    Ascorbic Acid (VITAMIN C) 1000 MG tablet, Take 1,000 mg by mouth daily., Disp: , Rfl:    dexamethasone (DECADRON) 4 MG tablet, Take 1 tablet (4 mg total) by mouth every 8 (eight) hours. (Patient taking differently: Take 4 mg by mouth every 8 (eight) hours. Twice a day), Disp: 90 tablet, Rfl: 1   pantoprazole (PROTONIX) 40 MG tablet, Take 1 tablet (40 mg total) by mouth daily., Disp: 30 tablet, Rfl: 1   VITAMIN A PO, Take 1 capsule by mouth daily., Disp: , Rfl:   I spent 40 minutes dedicated to the care of this patient on the date of this encounter to include pre-visit review of records, face-to-face time with the patient discussing conditions above, post visit ordering of testing, clinical documentation with the electronic health record, making appropriate referrals as documented, and communicating necessary findings to members of the patients care team.   Josephine Igo, DO [facility] 06/03/2020 2:12 PM

## 2020-06-03 NOTE — Patient Instructions (Signed)
Thank you for visiting Dr. Tonia Brooms at [facility]. Today we recommend the following: Orders Placed This Encounter  Procedures  . CT CHEST HIGH RESOLUTION   Return in about 5 months (around 11/01/2020) for Dr. Tonia Brooms.  After CT chest is completed.    Please do your part to reduce the spread of COVID-19.

## 2020-06-11 LAB — ACID FAST CULTURE WITH REFLEXED SENSITIVITIES (MYCOBACTERIA): Acid Fast Culture: NEGATIVE

## 2020-06-12 LAB — ACID FAST CULTURE WITH REFLEXED SENSITIVITIES (MYCOBACTERIA): Acid Fast Culture: NEGATIVE

## 2020-06-16 ENCOUNTER — Ambulatory Visit
Admission: RE | Admit: 2020-06-16 | Discharge: 2020-06-16 | Disposition: A | Discharge status: Home / Self Care | Payer: Self-pay | Source: Ambulatory Visit | Attending: Neurology | Admitting: Neurology

## 2020-06-16 DIAGNOSIS — G039 Meningitis, unspecified: Secondary | ICD-10-CM

## 2020-06-16 DIAGNOSIS — R519 Headache, unspecified: Secondary | ICD-10-CM

## 2020-06-16 MED ORDER — GADOBENATE DIMEGLUMINE 529 MG/ML IV SOLN
15.0000 mL | Freq: Once | INTRAVENOUS | Status: AC | PRN
  Administered 2020-06-16: 15 mL via INTRAVENOUS

## 2020-06-24 ENCOUNTER — Ambulatory Visit (INDEPENDENT_AMBULATORY_CARE_PROVIDER_SITE_OTHER): Payer: Self-pay | Admitting: Primary Care

## 2020-06-24 ENCOUNTER — Encounter (INDEPENDENT_AMBULATORY_CARE_PROVIDER_SITE_OTHER): Payer: Self-pay | Admitting: Primary Care

## 2020-06-24 ENCOUNTER — Other Ambulatory Visit: Payer: Self-pay

## 2020-06-24 VITALS — BP 116/81 | HR 101 | Temp 97.5°F | Resp 20 | Ht 71.5 in | Wt 177.0 lb

## 2020-06-24 DIAGNOSIS — Z7689 Persons encountering health services in other specified circumstances: Secondary | ICD-10-CM

## 2020-06-24 DIAGNOSIS — Z23 Encounter for immunization: Secondary | ICD-10-CM

## 2020-06-24 DIAGNOSIS — R0683 Snoring: Secondary | ICD-10-CM

## 2020-06-24 DIAGNOSIS — R5383 Other fatigue: Secondary | ICD-10-CM

## 2020-06-24 NOTE — Progress Notes (Signed)
New Patient Office Visit  Subjective:  Patient ID: Aaron Chapman, male    DOB: 20-Feb-1976  Age: 44 y.o. MRN: [id number]  CC:  Chief Complaint  Patient presents with  . Establish Care    HPI Mr. Aaron Chapman is a 44 year old Hispanic male who speaks Spanish and understands no English interpreter Aaron Chapman [phone] used for this appointment.  He presents for establishment of care.  Voices concerns about feeling fatigue, low energy and wife has noticed he snores sometimes.  Blood pressure is unremarkable 116/81 on no medications for blood pressure. COVID-19 on August 14 and had been febrile for 15 days afterwards before being fever free for a couple weeks.  Along with the fever, he had headaches, shortness of breath, nonproductive cough and some chest pain.  He had a T-max of 103-104.  He developed a nonpruritic rash on the trunk and limbs.  Due to the residual symptoms from COVID-19, he had a CT scan of the chest on 04/10/2020 which showed right lower lobe nodule, left lower lobe nodule and mediastinal and bilateral hilar lymphadenopathy.  Past Medical History:  Diagnosis Date  . COVID-19    positive 03/14/20 - tested at a clinic in [place] - unsure of name of clinic  . Dyspnea   . History of kidney stones   . Lung nodules   . Pneumonia     Past Surgical History:  Procedure Laterality Date  . BRAIN SURGERY  2015  . BRONCHIAL NEEDLE ASPIRATION BIOPSY  04/30/2020   Procedure: BRONCHIAL NEEDLE ASPIRATION BIOPSIES;  Surgeon: Lupita Leash, MD;  Location: [facility] ENDOSCOPY;  Service: Cardiopulmonary;;  . BRONCHIAL WASHINGS  04/30/2020   Procedure: BRONCHIAL WASHINGS;  Surgeon: Lupita Leash, MD;  Location: [facility] ENDOSCOPY;  Service: Cardiopulmonary;;  . ENDOBRONCHIAL ULTRASOUND N/A 04/30/2020   Procedure: ENDOBRONCHIAL ULTRASOUND;  Surgeon: Lupita Leash, MD;  Location: [facility] ENDOSCOPY;  Service: Cardiopulmonary;  Laterality: N/A;  . IR 3D INDEPENDENT WKST   05/05/2020  . IR ANGIO INTRA EXTRACRAN SEL COM CAROTID INNOMINATE BILAT MOD SED  05/05/2020  . IR ANGIO VERTEBRAL SEL VERTEBRAL UNI L MOD SED  05/05/2020  . IR ANGIO VERTEBRAL SEL VERTEBRAL UNI R MOD SED  05/04/2020  . IR US GUIDE VASC ACCESS RIGHT  05/04/2020  . KIDNEY STONE SURGERY     removal  . TONSILLECTOMY    . VIDEO BRONCHOSCOPY N/A 04/30/2020   Procedure: VIDEO BRONCHOSCOPY WITHOUT FLUORO;  Surgeon: Lupita Leash, MD;  Location: [facility] ENDOSCOPY;  Service: Cardiopulmonary;  Laterality: N/A;  . WISDOM TOOTH EXTRACTION      Family History  Problem Relation Age of Onset  . Hypertension Mother   . Diabetes Mother   . Pneumonia Father   . Bladder Cancer Father     Social History   Socioeconomic History  . Marital status: Married    Spouse name: Aaron Chapman  . Number of children: 1  . Years of education: Primary school  . Highest education level: Not on file  Occupational History  . Occupation: Holiday representative  Tobacco Use  . Smoking status: Never Smoker  . Smokeless tobacco: Never Used  Vaping Use  . Vaping Use: Never assessed  Substance and Sexual Activity  . Alcohol use: Yes    Comment: occasional   . Drug use: Not Currently  . Sexual activity: Not on file  Other Topics Concern  . Not on file  Social History Narrative   Caffeine use: daily (coffee)   Right handed  Social Determinants of Health   Financial Resource Strain:   . Difficulty of Paying Living Expenses: Not on file  Food Insecurity:   . Worried About Programme researcher, broadcasting/film/video in the Last Year: Not on file  . Ran Out of Food in the Last Year: Not on file  Transportation Needs:   . Lack of Transportation (Medical): Not on file  . Lack of Transportation (Non-Medical): Not on file  Physical Activity:   . Days of Exercise per Week: Not on file  . Minutes of Exercise per Session: Not on file  Stress:   . Feeling of Stress : Not on file  Social Connections:   . Frequency of Communication with Friends and  Family: Not on file  . Frequency of Social Gatherings with Friends and Family: Not on file  . Attends Religious Services: Not on file  . Active Member of Clubs or Organizations: Not on file  . Attends Banker Meetings: Not on file  . Marital Status: Not on file  Intimate Partner Violence:   . Fear of Current or Ex-Partner: Not on file  . Emotionally Abused: Not on file  . Physically Abused: Not on file  . Sexually Abused: Not on file    ROS Review of Systems  Constitutional: Positive for fatigue.  Respiratory:       Snoring at times no sleep apnea   Psychiatric/Behavioral: Positive for sleep disturbance.  All other systems reviewed and are negative.   Objective:   Today's Vitals: BP 116/81   Pulse (!) 101   Temp (!) 97.5 F (36.4 C)   Resp 20   Ht 5' 11.5" (1.816 m)   Wt 177 lb (80.3 kg)   SpO2 97%   BMI 24.34 kg/m   Physical Exam Vitals reviewed.  Constitutional:      Appearance: Normal appearance.  HENT:     Head: Normocephalic.     Right Ear: Tympanic membrane normal.     Left Ear: Tympanic membrane normal.     Nose: Nose normal.  Eyes:     Extraocular Movements: Extraocular movements intact.     Pupils: Pupils are equal, round, and reactive to light.  Cardiovascular:     Rate and Rhythm: Normal rate and regular rhythm.  Pulmonary:     Effort: Pulmonary effort is normal.     Breath sounds: Normal breath sounds.  Abdominal:     General: Bowel sounds are normal.     Palpations: Abdomen is soft.  Musculoskeletal:        General: Normal range of motion.     Cervical back: Normal range of motion.  Skin:    General: Skin is warm and dry.  Neurological:     Mental Status: He is alert and oriented to person, place, and time.  Psychiatric:        Mood and Affect: Mood normal.        Behavior: Behavior normal.        Thought Content: Thought content normal.        Judgment: Judgment normal.    Aaron Chapman was seen today for establish  care.  Diagnoses and all orders for this visit:  Encounter to establish care Establishing care with new PCP  Fatigue, unspecified type Pace yourself, Plan your day,Include naps and breaks schedule a relaxing day, get a little exercise,fuel the body, consider complementary therapies, deep breathing, prayer/medication and guided meditation Labs reviewed  CBC, wnl followed by neurology and pulmonary   Snoring  Patient presents with possible obstructive sleep apnea. Patent has a 15month history of symptoms of daytime fatigue and morning fatigue. Patient generally gets 2 or 3 hours of sleep per night, and states they generally have difficulty falling back asleep if awakened. Snoring of mild severity is not present. Apneic episodes is not present. Nasal obstruction are present.  Patient has had tonsillectomy.    Need for immunization against influenza -     Flu Vaccine QUAD 36+ mos IM   Follow-up: Return if symptoms worsen or fail to improve.   Grayce Sessions, NP

## 2020-06-24 NOTE — Progress Notes (Signed)
Concerns with sleeping problems Fatigue, low energy. Wife notices that he snores sometimes. Not all the time.   Numbness and cramping in right hand

## 2020-07-07 ENCOUNTER — Other Ambulatory Visit: Payer: Self-pay

## 2020-07-07 ENCOUNTER — Encounter: Payer: Self-pay | Admitting: Neurology

## 2020-07-07 ENCOUNTER — Ambulatory Visit: Payer: Self-pay | Admitting: Neurology

## 2020-07-07 VITALS — BP 131/78 | HR 100 | Ht 71.5 in | Wt 179.5 lb

## 2020-07-07 DIAGNOSIS — R519 Headache, unspecified: Secondary | ICD-10-CM

## 2020-07-07 DIAGNOSIS — G039 Meningitis, unspecified: Secondary | ICD-10-CM

## 2020-07-07 DIAGNOSIS — R42 Dizziness and giddiness: Secondary | ICD-10-CM

## 2020-07-07 MED ORDER — DEXAMETHASONE 1 MG PO TABS: ORAL_TABLET | ORAL | 0 refills | Status: DC

## 2020-07-07 NOTE — Progress Notes (Signed)
[facility]  PATIENT: Aaron Chapman DOB: 09/07/1975  REFERRING DOCTOR OR PCP:  None SOURCE: Patient (through interpreter), notes from hospitalization, imaging and lab reports, multiple imaging studies personally reviewed.  _________________________________   HISTORICAL  CHIEF COMPLAINT:  Chief Complaint  Patient presents with  . Follow-up    RM 12 with family, interpreter. Last seen 05/13/2020. Taking dexamethasone 4mg  po BID. No strength in hands/weakness in legs.  Denies any falls.     HISTORY OF PRESENT ILLNESS:  Aaron Chapman is a 44 y.o. man with noninfectious meningitis and abnormal brain MRI   Update 07/07/2020: Currently, he denies any headaches or visual changes.   He is still noticing weakness in his hands and legs.   He is able to walk about 1 mile now without stopping, twice as far as a couple months ago.  He still feels off balance though not as bad as a couple months ago.  He denies shortness of breath.     We have been reducing the dexamethasone dose from 4 mg 3 times daily to 4 mg po daily.   He has had no worsening of symptoms while we have tapered the dose.  MRI 06/16/2020 shows Focus of abnormal enhancement in the right cerebellopontine angle improved in size and intensity of enhancement compared to the 05/01/2020 MRI.  Elsewhere, the enhancement that had been seen in the right greater than left cerebellar hemispheric sulci has resolved.  Additionally, the increased signal in the right middle cerebellar peduncle assumed to be cerebritis has also resolved.  History of meningitis: He presented to the emergency room 04/21/2020 with fever of unknown origin.  Of note, he had had COVID-19 on August 14 and had been febrile for 15 days afterwards before being fever free for a couple weeks.  Along with the fever, he had headaches, shortness of breath, nonproductive cough and some chest pain.  He had a T-max of 103-104.  He developed  a nonpruritic rash on the trunk and limbs.  Due to the residual symptoms from COVID-19, he had a CT scan of the chest on 04/10/2020 which showed right lower lobe nodule, left lower lobe nodule and mediastinal and bilateral hilar lymphadenopathy.  While in the emergency room 04/21/2020 for the fever, the blood work was fine.  QuantiFERON gold and blood cultures were performed (these were negative).  He was referred to infectious disease as an outpatient.  He re-presented to the emergency room with worsening fever and headache on 04/25/2020.  He was admitted for further evaluation.  Lumbar puncture was abnormal with greatly elevated protein (357) and reduced glucose (23).  Additionally, MRI of the brain showed some leptomeningeal enhancement that was worse at the cerebellopontine angle on the right.  Some patchy parenchymal enhancement was noted in the right middle cerebellar peduncle.  The MRI showing prominent basilar meningitis combined with the LP were worrisome for tuberculosis meningitis.  However, AFB was negative.  Later in the admission, he had a bronchoscopy.  Pathology of the lymph node showed granulomatous type changes but no lymphoma.  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  This was confirmed with conventional angiography on 05/05/2020.  It is small measuring 2.7 x 1.6 mm.  He was started on steroids while in the hospital.  Initially he was placed on medication for tuberculosis but as more lab work return that was discontinued.  He continued on dexamethasone 4 mg 3 times daily upon discharge.  [place].  He had received the COVID-19 [employer] vaccination about 3 months before his COVID-19 infection.  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  He has no history of tuberculosis.  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  He had surgery for this in 2015.  Repeat brain MRI 06/16/2020 shows Focus of abnormal enhancement in the right cerebellopontine angle improved in size and intensity of enhancement compared to the 05/01/2020 MRI.  Elsewhere, the enhancement that had been seen in the right greater than left cerebellar hemispheric sulci has resolved.  Additionally, the increased signal in the right middle cerebellar peduncle assumed to be cerebritis has also resolved.  REVIEW OF SYSTEMS: Constitutional: Fevers and fatigue as above  eyes: No visual changes, double vision, eye pain Ear, nose and throat: No hearing loss, ear pain, nasal congestion, sore throat Cardiovascular: No chest pain, palpitations Respiratory: No shortness of breath at rest or with exertion.   No wheezes GastrointestinaI: No nausea, vomiting, diarrhea, abdominal pain, fecal incontinence Genitourinary: No dysuria, urinary retention or frequency.  No nocturia. Musculoskeletal: No neck pain, back pain Integumentary: He has a mild rash that is nonpruritic Neurological: as above Psychiatric: No depression at this time.  No anxiety Endocrine: No palpitations, diaphoresis, change in appetite, change in weigh or increased thirst Hematologic/Lymphatic: No anemia, purpura, petechiae. Allergic/Immunologic: No itchy/runny eyes, nasal congestion, recent allergic reactions, rashes  ALLERGIES: Allergies  Allergen Reactions  . Aspirin Swelling    Facial swelling    HOME MEDICATIONS:  Current Outpatient Medications:  .  Ascorbic Acid (VITAMIN C) 1000 MG tablet, Take 1,000 mg by mouth daily., Disp: , Rfl:  .  pantoprazole (PROTONIX) 40 MG tablet, Take 1 tablet (40 mg total) by mouth daily., Disp: 30 tablet, Rfl: 1 .  VITAMIN A PO, Take 1 capsule by mouth daily., Disp: , Rfl:  .  dexamethasone (DECADRON) 1 MG tablet, Taper from 3 pills a day to 1 pill a day as described, Disp: 120 tablet, Rfl: 0  PAST  MEDICAL HISTORY: Past Medical History:  Diagnosis Date  . COVID-19    positive 03/14/20 - [place] at a clinic in [place] - unsure of name of clinic  . Dyspnea   . History of kidney stones   . Lung nodules   . Pneumonia     PAST SURGICAL HISTORY: Past Surgical History:  Procedure Laterality Date  . BRAIN SURGERY  2015  . BRONCHIAL NEEDLE ASPIRATION BIOPSY  04/30/2020   Procedure: BRONCHIAL NEEDLE ASPIRATION BIOPSIES;  Surgeon: Lupita LeashMcQuaid, Douglas B, MD;  [facility];  Service: Cardiopulmonary;;  . BRONCHIAL WASHINGS  04/30/2020   Procedure: BRONCHIAL WASHINGS;  Surgeon: Lupita LeashMcQuaid, Douglas B, MD;  [facility];  Service: Cardiopulmonary;;  . ENDOBRONCHIAL ULTRASOUND N/A 04/30/2020   Procedure: ENDOBRONCHIAL ULTRASOUND;  Surgeon: Lupita LeashMcQuaid, Douglas B, MD;  [facility];  Service: Cardiopulmonary;  Laterality: N/A;  . IR 3D INDEPENDENT WKST  05/05/2020  . IR ANGIO INTRA EXTRACRAN SEL COM CAROTID INNOMINATE BILAT MOD SED  05/05/2020  . IR ANGIO VERTEBRAL SEL VERTEBRAL UNI L MOD SED  05/05/2020  . IR ANGIO VERTEBRAL SEL VERTEBRAL UNI R MOD SED  05/04/2020  . IR US GUIDE VASC ACCESS RIGHT  05/04/2020  . KIDNEY STONE SURGERY     removal  . TONSILLECTOMY    . VIDEO BRONCHOSCOPY  N/A 04/30/2020   Procedure: VIDEO BRONCHOSCOPY WITHOUT FLUORO;  Surgeon: Lupita Leash, MD;  Location: [facility] ENDOSCOPY;  Service: Cardiopulmonary;  Laterality: N/A;  . WISDOM TOOTH EXTRACTION      FAMILY HISTORY: Family History  Problem Relation Age of Onset  . Hypertension Mother   . Diabetes Mother   . Pneumonia Father   . Bladder Cancer Father     SOCIAL HISTORY:  Social History   Socioeconomic History  . Marital status: Married    Spouse name: Elayne Snare  . Number of children: 1  . Years of education: Primary school  . Highest education level: Not on file  Occupational History  . Occupation: Holiday representative  Tobacco Use  . Smoking status: Never Smoker  . Smokeless tobacco:  Never Used  Vaping Use  . Vaping Use: Never assessed  Substance and Sexual Activity  . Alcohol use: Yes    Comment: occasional   . Drug use: Not Currently  . Sexual activity: Not on file  Other Topics Concern  . Not on file  Social History Narrative   Caffeine use: daily (coffee)   Right handed    Social Determinants of Health   Financial Resource Strain:   . Difficulty of Paying Living Expenses: Not on file  Food Insecurity:   . Worried About Programme researcher, broadcasting/film/video in the Last Year: Not on file  . Ran Out of Food in the Last Year: Not on file  Transportation Needs:   . Lack of Transportation (Medical): Not on file  . Lack of Transportation (Non-Medical): Not on file  Physical Activity:   . Days of Exercise per Week: Not on file  . Minutes of Exercise per Session: Not on file  Stress:   . Feeling of Stress : Not on file  Social Connections:   . Frequency of Communication with Friends and Family: Not on file  . Frequency of Social Gatherings with Friends and Family: Not on file  . Attends Religious Services: Not on file  . Active Member of Clubs or Organizations: Not on file  . Attends Banker Meetings: Not on file  . Marital Status: Not on file  Intimate Partner Violence:   . Fear of Current or Ex-Partner: Not on file  . Emotionally Abused: Not on file  . Physically Abused: Not on file  . Sexually Abused: Not on file     PHYSICAL EXAM  Vitals:   07/07/20 1602  BP: 131/78  Pulse: 100  SpO2: 98%  Weight: 179 lb 8 oz (81.4 kg)  Height: 5' 11.5" (1.816 m)    Body mass index is 24.69 kg/m.   General: The patient is well-developed and well-nourished and in no acute distress  HEENT:  Head is [place]/AT.  Sclera are anicteric.  Funduscopic exam shows normal optic discs and retinal vessels.  Neck: No carotid bruits are noted.  The neck is nontender.  Cardiovascular: The heart has a regular rate and rhythm with a normal S1 and S2. There were no murmurs,  gallops or rubs.    Skin: He has a mild rash predominantly in the chest and abdomen/flanks..  Musculoskeletal:  Back is nontender  Neurologic Exam  Mental status: The patient is alert and oriented x 3 at the time of the examination. The patient has apparent normal recent and remote memory, with an apparently normal attention span and concentration ability.   Speech is normal.  Cranial nerves: Extraocular movements are full. Pupils are equal, round, and reactive  to light and accomodation.   Facial symmetry is present. There is good facial sensation to soft touch bilaterally  .Facial strength is normal.  Trapezius and sternocleidomastoid strength is normal. No dysarthria is noted.  The tongue is midline, and the patient has symmetric elevation of the soft palate. No obvious hearing deficits are noted.  Motor:  Muscle bulk is normal.   Tone is normal. Strength is  5 / 5 in all 4 extremities.   Sensory: Sensory testing is intact to pinprick, soft touch and vibration sensation in all 4 extremities.  Coordination: Cerebellar testing reveals good finger-nose-finger and heel-to-shin bilaterally.  Gait and station: Station is normal.   Gait is normal. Tandem gait is mildly wide. Romberg is negative.   Reflexes: Deep tendon reflexes are symmetric and normal in arms, 3 at the knees and 2 at the ankles..   Plantar responses are flexor.     DIAGNOSTIC DATA (LABS, IMAGING, TESTING) - I reviewed patient records, labs, notes, testing and imaging myself where available.  Lab Results  Component Value Date   WBC 11.7 (H) 05/13/2020   HGB 14.3 05/13/2020   HCT 41.8 05/13/2020   MCV 93 05/13/2020   PLT 320 05/13/2020      Component Value Date/Time   NA 136 05/04/2020 0443   K 4.4 05/04/2020 0443   CL 104 05/04/2020 0443   CO2 25 05/04/2020 0443   GLUCOSE 117 (H) 05/04/2020 0443   BUN 5 (L) 05/04/2020 0443   CREATININE 0.65 05/04/2020 0443   CALCIUM 8.9 05/04/2020 0443   PROT 6.8 04/29/2020  1122   ALBUMIN 3.6 04/29/2020 1122   ALBUMIN 3.9 (L) 04/25/2020 2000   AST 13 (L) 04/29/2020 1122   ALT 25 04/29/2020 1122   ALKPHOS 36 (L) 04/29/2020 1122   BILITOT 1.0 04/29/2020 1122   GFRNONAA >60 05/04/2020 0443   GFRAA >60 05/04/2020 0443       ASSESSMENT AND PLAN  Meningitis - Plan: MR BRAIN W WO CONTRAST  Bilateral headaches - Plan: MR BRAIN W WO CONTRAST  Vertigo - Plan: MR BRAIN W WO CONTRAST   1.   We will continue the slow taper of the Decadron dose.  For 3 weeks he will take 3 mg daily, for the next 3 weeks he will take 2 mg daily, for the next 3 weeks he will take 1 mg daily and then he will stop.  This will be around mid February.  In mid March, we will check an MRI of the brain with and without contrast. 2.   Stay active and exercise as tolerated. 3.   Return to see me a couple weeks after brain MRI   Zadiel Leyh A. Epimenio Foot, MD, [facility] 07/07/2020, 8:10 PM Certified in Neurology, Clinical Neurophysiology, Sleep Medicine and Neuroimaging  [facility] [address], [address] [place], [place] [address] [phone]

## 2020-07-08 ENCOUNTER — Telehealth: Payer: Self-pay | Admitting: Neurology

## 2020-07-08 NOTE — Telephone Encounter (Signed)
self pay order sent to GI. They will reach out to the patient to schedule.  °

## 2020-08-05 ENCOUNTER — Telehealth (INDEPENDENT_AMBULATORY_CARE_PROVIDER_SITE_OTHER): Payer: Self-pay | Admitting: Primary Care

## 2020-10-02 ENCOUNTER — Ambulatory Visit
Admission: RE | Admit: 2020-10-02 | Discharge: 2020-10-02 | Disposition: A | Discharge status: Home / Self Care | Payer: No Typology Code available for payment source | Source: Ambulatory Visit | Attending: Neurology | Admitting: Neurology

## 2020-10-02 DIAGNOSIS — R519 Headache, unspecified: Secondary | ICD-10-CM

## 2020-10-02 DIAGNOSIS — R42 Dizziness and giddiness: Secondary | ICD-10-CM

## 2020-10-02 DIAGNOSIS — G039 Meningitis, unspecified: Secondary | ICD-10-CM

## 2020-10-02 MED ORDER — GADOBENATE DIMEGLUMINE 529 MG/ML IV SOLN
16.0000 mL | Freq: Once | INTRAVENOUS | Status: AC | PRN
  Administered 2020-10-02: 16 mL via INTRAVENOUS

## 2020-10-06 ENCOUNTER — Telehealth: Payer: Self-pay | Admitting: *Deleted

## 2020-10-06 MED ORDER — DEXAMETHASONE 1 MG PO TABS: ORAL_TABLET | ORAL | 0 refills | Status: DC

## 2020-10-06 NOTE — Telephone Encounter (Signed)
Called [employer] interpreter line and spoke w/ interpreter, Diablo ID [phone]. He LVM for pt about results. I sent in new prescription to pharmacy. Pt has follow up with Dr. Epimenio Foot 10/14/20.

## 2020-10-06 NOTE — Telephone Encounter (Signed)
-----   Message from Asa Lente, MD sent at 10/05/2020 10:28 PM EST ----- The MRi showed that the abnormal area with inflammation was slightly larger than the previous MRi - therefore I would like him to go back of 3 pills of dexamethasone a day.  We will need to check another MRI later in the year

## 2020-10-14 ENCOUNTER — Ambulatory Visit (INDEPENDENT_AMBULATORY_CARE_PROVIDER_SITE_OTHER): Payer: Self-pay | Admitting: Neurology

## 2020-10-14 ENCOUNTER — Encounter: Payer: Self-pay | Admitting: Neurology

## 2020-10-14 VITALS — BP 117/78 | HR 95 | Ht 71.5 in | Wt 179.0 lb

## 2020-10-14 DIAGNOSIS — R5383 Other fatigue: Secondary | ICD-10-CM | POA: Insufficient documentation

## 2020-10-14 DIAGNOSIS — Z8661 Personal history of infections of the central nervous system: Secondary | ICD-10-CM

## 2020-10-14 DIAGNOSIS — Z8669 Personal history of other diseases of the nervous system and sense organs: Secondary | ICD-10-CM | POA: Insufficient documentation

## 2020-10-14 DIAGNOSIS — E559 Vitamin D deficiency, unspecified: Secondary | ICD-10-CM

## 2020-10-14 DIAGNOSIS — G0490 Encephalitis and encephalomyelitis, unspecified: Secondary | ICD-10-CM

## 2020-10-14 MED ORDER — PREDNISONE 10 MG PO TABS: 10.0000 mg | ORAL_TABLET | Freq: Every day | ORAL | 3 refills | Status: DC

## 2020-10-14 NOTE — Progress Notes (Signed)
[facility]  PATIENT: Aaron Chapman DOB: 05/16/1976  REFERRING DOCTOR OR PCP:  None SOURCE: Patient (through interpreter), notes from hospitalization, imaging and lab reports, multiple imaging studies personally reviewed.  _________________________________   HISTORICAL  CHIEF COMPLAINT:  Chief Complaint  Patient presents with  . Follow-up    Room 12. He is here with his wife, Hilda Lias and an interpreter from [place]. They would like to further discuss his MRI results. He has not restarted the steroids.     HISTORY OF PRESENT ILLNESS:  Jb Dulworth is a 45 y.o. man with noninfectious meningitis and abnormal brain MRI    Update 10/14/2020: We were tapering the decadron off and he stopped 5 weeks ago.    He actually feels better off of the steroid.  He denies any significant problem with headache.  However, he is noting some mild pain in the back of the neck.     Gait is fine.  Occasionally, mild off balanced when he stands up    Vision is fine.  He still reports that fatigue is a daily problem for him.  Repeat MRI 10/02/2020 showed more inflammation than the previous MRI from 06/16/2020.  Therefore, I have wanted him to go back on the Decadron.  He has not yet started and we discussed this in further detail.  He has an unusual MRI and history.  He had a neurosurgical operation involving the right cerebellar pontine angle due to right trigeminal neuralgia (V1 and V2).  Symptoms greatly improved after the surgery in 2015.  It is certainly possible that some of the enhancement seen at the cerebellopontine angle are sequela of the surgery.  In the September 2021 MRI, there was clear information on MRI that also involved abnormal signal in the right middle cerebellar peduncle and the cerebellar sulci.  CSF was abnormal.  We have been most concerned about either vasculitis or neurosarcoid (has mediastinal lymphadenopathy and granulomatous changes).   Angiotensin-converting enzyme levels and pan ANCA have not been elevated but these were measured after steroids started.  Update 07/07/2020: Currently, he denies any headaches or visual changes.   He is still noticing weakness in his hands and legs.   He is able to walk about 1 mile now without stopping, twice as far as a couple months ago.  He still feels off balance though not as bad as a couple months ago.  He denies shortness of breath.     We have been reducing the dexamethasone dose from 4 mg 3 times daily to 4 mg po daily.   He has had no worsening of symptoms while we have tapered the dose.  MRI 06/16/2020 shows Focus of abnormal enhancement in the right cerebellopontine angle improved in size and intensity of enhancement compared to the 05/01/2020 MRI.  Elsewhere, the enhancement that had been seen in the right greater than left cerebellar hemispheric sulci has resolved.  Additionally, the increased signal in the right middle cerebellar peduncle assumed to be cerebritis has also resolved.  In 2015, he had surgery for right trigeminal neuralgia.     History of meningitis: He presented to the emergency room 04/21/2020 with fever of unknown origin.  Of note, he had had COVID-19 on August 14 and had been febrile for 15 days afterwards before being fever free for a couple weeks.  Along with the fever, he had headaches, shortness of breath, nonproductive cough and some chest pain.  He had a T-max of 103-104.  He developed a  nonpruritic rash on the trunk and limbs.  Due to the residual symptoms from COVID-19, he had a CT scan of the chest on 04/10/2020 which showed right lower lobe nodule, left lower lobe nodule and mediastinal and bilateral hilar lymphadenopathy.  While in the emergency room 04/21/2020 for the fever, the blood work was fine.  QuantiFERON gold and blood cultures were performed (these were negative).  He was referred to infectious disease as an outpatient.  He re-presented to the emergency  room with worsening fever and headache on 04/25/2020.  He was admitted for further evaluation.  Lumbar puncture was abnormal with greatly elevated protein (357) and reduced glucose (23).  Additionally, MRI of the brain showed some leptomeningeal enhancement that was worse at the cerebellopontine angle on the right.  Some patchy parenchymal enhancement was noted in the right middle cerebellar peduncle.  The MRI showing prominent basilar meningitis combined with the LP were worrisome for tuberculosis meningitis.  However, AFB was negative.  Later in the admission, he had a bronchoscopy.  Pathology of the lymph node showed granulomatous type changes but no lymphoma.  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  This was confirmed with conventional angiography on 05/05/2020.  It is small measuring 2.7 x 1.6 mm.  He was started on steroids while in the hospital.  Initially he was placed on medication for tuberculosis but as more lab work return that was discontinued.  He continued on dexamethasone 4 mg 3 times daily upon discharge.  [place].  He had received the COVID-19 [employer] vaccination about 3 months before his COVID-19 infection.  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  He has no history of tuberculosis.  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  He had surgery for this in 2015.  Repeat brain MRI 06/16/2020 shows Focus of abnormal enhancement in the right cerebellopontine angle improved in size and intensity of enhancement compared to the 05/01/2020 MRI.  Elsewhere, the enhancement that had been seen in the right greater than left cerebellar hemispheric sulci has resolved.  Additionally, the increased signal in the right middle cerebellar peduncle assumed to be cerebritis has also  resolved.  REVIEW OF SYSTEMS: Constitutional: Fevers and fatigue as above  eyes: No visual changes, double vision, eye pain Ear, nose and throat: No hearing loss, ear pain, nasal congestion, sore throat Cardiovascular: No chest pain, palpitations Respiratory: No shortness of breath at rest or with exertion.   No wheezes GastrointestinaI: No nausea, vomiting, diarrhea, abdominal pain, fecal incontinence Genitourinary: No dysuria, urinary retention or frequency.  No nocturia. Musculoskeletal: No neck pain, back pain Integumentary: He has a mild rash that is nonpruritic Neurological: as above Psychiatric: No depression at this time.  No anxiety Endocrine: No palpitations, diaphoresis, change in appetite, change in weigh or increased thirst Hematologic/Lymphatic: No anemia, purpura, petechiae. Allergic/Immunologic: No itchy/runny eyes, nasal congestion, recent allergic reactions, rashes  ALLERGIES: Allergies  Allergen Reactions  . Aspirin Swelling    Facial swelling    HOME MEDICATIONS:  Current Outpatient Medications:  .  Ascorbic Acid (VITAMIN C) 1000 MG tablet, Take 1,000 mg by mouth daily., Disp: , Rfl:  .  dexamethasone (DECADRON) 1 MG tablet, Take 3 pills by mouth daily, Disp: 90 tablet, Rfl: 0 .  pantoprazole (PROTONIX) 40 MG tablet,  Take 1 tablet (40 mg total) by mouth daily., Disp: 30 tablet, Rfl: 1 .  predniSONE (DELTASONE) 10 MG tablet, Take 1 tablet (10 mg total) by mouth daily with breakfast., Disp: 90 tablet, Rfl: 3 .  VITAMIN A PO, Take 1 capsule by mouth daily., Disp: , Rfl:   PAST MEDICAL HISTORY: Past Medical History:  Diagnosis Date  . COVID-19    positive 03/14/20 - tested at a clinic in [place] - unsure of name of clinic  . Dyspnea   . History of kidney stones   . Lung nodules   . Pneumonia     PAST SURGICAL HISTORY: Past Surgical History:  Procedure Laterality Date  . BRAIN SURGERY  2015  . BRONCHIAL NEEDLE ASPIRATION BIOPSY  04/30/2020    Procedure: BRONCHIAL NEEDLE ASPIRATION BIOPSIES;  Surgeon: Lupita Leash, MD;  Location: [facility] ENDOSCOPY;  Service: Cardiopulmonary;;  . BRONCHIAL WASHINGS  04/30/2020   Procedure: BRONCHIAL WASHINGS;  Surgeon: Lupita Leash, MD;  Location: [facility] ENDOSCOPY;  Service: Cardiopulmonary;;  . ENDOBRONCHIAL ULTRASOUND N/A 04/30/2020   Procedure: ENDOBRONCHIAL ULTRASOUND;  Surgeon: Lupita Leash, MD;  Location: [facility] ENDOSCOPY;  Service: Cardiopulmonary;  Laterality: N/A;  . IR 3D INDEPENDENT WKST  05/05/2020  . IR ANGIO INTRA EXTRACRAN SEL COM CAROTID INNOMINATE BILAT MOD SED  05/05/2020  . IR ANGIO VERTEBRAL SEL VERTEBRAL UNI L MOD SED  05/05/2020  . IR ANGIO VERTEBRAL SEL VERTEBRAL UNI R MOD SED  05/04/2020  . IR US GUIDE VASC ACCESS RIGHT  05/04/2020  . KIDNEY STONE SURGERY     removal  . TONSILLECTOMY    . VIDEO BRONCHOSCOPY N/A 04/30/2020   Procedure: VIDEO BRONCHOSCOPY WITHOUT FLUORO;  Surgeon: Lupita Leash, MD;  Location: [facility] ENDOSCOPY;  Service: Cardiopulmonary;  Laterality: N/A;  . WISDOM TOOTH EXTRACTION      FAMILY HISTORY: Family History  Problem Relation Age of Onset  . Hypertension Mother   . Diabetes Mother   . Pneumonia Father   . Bladder Cancer Father     SOCIAL HISTORY:  Social History   Socioeconomic History  . Marital status: Married    Spouse name: Elayne Snare  . Number of children: 1  . Years of education: Primary school  . Highest education level: Not on file  Occupational History  . Occupation: Holiday representative  Tobacco Use  . Smoking status: Never Smoker  . Smokeless tobacco: Never Used  Vaping Use  . Vaping Use: Not on file  Substance and Sexual Activity  . Alcohol use: Yes    Comment: occasional   . Drug use: Not Currently  . Sexual activity: Not on file  Other Topics Concern  . Not on file  Social History Narrative   Caffeine use: daily (coffee)   Right handed    Social Determinants of Health   Financial Resource Strain: Not on file   Food Insecurity: Not on file  Transportation Needs: Not on file  Physical Activity: Not on file  Stress: Not on file  Social Connections: Not on file  Intimate Partner Violence: Not on file     PHYSICAL EXAM  Vitals:   10/14/20 1000  BP: 117/78  Pulse: 95  Weight: 179 lb (81.2 kg)  Height: 5' 11.5" (1.816 m)    Body mass index is 24.62 kg/m.   General: The patient is well-developed and well-nourished and in no acute distress  HEENT: Sclera are anicteric.   Neck: No carotid bruits are noted.  The neck is nontender.  Cardiovascular:  The heart has a regular rate and rhythm with a normal S1 and S2. There were no murmurs, gallops or rubs.    Skin: He has a mild rash predominantly in the chest and abdomen/flanks..  Musculoskeletal:  Back is nontender  Neurologic Exam  Mental status: The patient is alert and oriented x 3 at the time of the examination. The patient has apparent normal recent and remote memory, with an apparently normal attention span and concentration ability.   Speech is normal.  Cranial nerves: Extraocular movements are full.  Facial strength and sensation was normal.  No obvious hearing deficits are noted.  Motor:  Muscle bulk is normal.   Tone is normal. Strength is  5 / 5 in all 4 extremities.   Sensory: Sensory testing is intact to pinprick, soft touch and vibration sensation in all 4 extremities.  Coordination: Cerebellar testing reveals good finger-nose-finger and heel-to-shin bilaterally.  Gait and station: Station is normal.   Gait is normal. Tandem gait is mildly wide. Romberg is negative.   Reflexes: Deep tendon reflexes are symmetric and normal in arms, 3 at the knees and 2 at the ankles.Marland Kitchen       DIAGNOSTIC DATA (LABS, IMAGING, TESTING) - I reviewed patient records, labs, notes, testing and imaging myself where available.  Lab Results  Component Value Date   WBC 11.7 (H) 05/13/2020   HGB 14.3 05/13/2020   HCT 41.8 05/13/2020   MCV  93 05/13/2020   PLT 320 05/13/2020      Component Value Date/Time   NA 136 05/04/2020 0443   K 4.4 05/04/2020 0443   CL 104 05/04/2020 0443   CO2 25 05/04/2020 0443   GLUCOSE 117 (H) 05/04/2020 0443   BUN 5 (L) 05/04/2020 0443   CREATININE 0.65 05/04/2020 0443   CALCIUM 8.9 05/04/2020 0443   PROT 6.8 04/29/2020 1122   ALBUMIN 3.6 04/29/2020 1122   ALBUMIN 3.9 (L) 04/25/2020 2000   AST 13 (L) 04/29/2020 1122   ALT 25 04/29/2020 1122   ALKPHOS 36 (L) 04/29/2020 1122   BILITOT 1.0 04/29/2020 1122   GFRNONAA >60 05/04/2020 0443   GFRAA >60 05/04/2020 0443       ASSESSMENT AND PLAN  Brain inflammation - Plan: Angiotensin converting enzyme, Comprehensive metabolic panel, CBC with Differential/Platelet, Pan-ANCA  Vitamin D deficiency - Plan: VITAMIN D 25 Hydroxy (Vit-D Deficiency, Fractures)  Other fatigue  History of meningitis  History of trigeminal neuralgia   1.   He has an unusual MRI and history.  He had a neurosurgical operation involving the right cerebellar pontine angle due to right trigeminal neuralgia (V1 and V2).  Symptoms greatly improved after the surgery in 2015.  It is certainly possible that some of the enhancement seen at the cerebellopontine angle are sequela of the surgery.  In the September 2021 MRI, there was clear information on MRI that also involved abnormal signal in the right middle cerebellar peduncle and the cerebellar sulci.  CSF was abnormal.  We have been most concerned about either vasculitis or neurosarcoid (has mediastinal lymphadenopathy and granulomatous changes).  Angiotensin-converting enzyme levels and pan ANCA have not been elevated but these were measured after steroids started. 2.   Prednisone 10 mg daily.  We will check angiotensin-converting enzyme and pen ANCA today.  He has been off steroids for 5 weeks. 3.   Return to see me in 5 to 6 months.  Call sooner if new or worsening symptoms.   Reshaun Briseno A. Epimenio Foot, MD, [facility] 10/14/2020,  1:07 PM Certified in Neurology, Clinical Neurophysiology, Sleep Medicine and Neuroimaging  [facility] [address] [place], [place] [address] [phone]

## 2020-10-16 LAB — CBC WITH DIFFERENTIAL/PLATELET
Basophils Absolute: 0.1 10*3/uL (ref 0.0–0.2)
Basos: 2 %
EOS (ABSOLUTE): 0.1 10*3/uL (ref 0.0–0.4)
Eos: 2 %
Hematocrit: 41.5 % (ref 37.5–51.0)
Hemoglobin: 13.8 g/dL (ref 13.0–17.7)
Immature Grans (Abs): 0 10*3/uL (ref 0.0–0.1)
Immature Granulocytes: 1 %
Lymphocytes Absolute: 0.9 10*3/uL (ref 0.7–3.1)
Lymphs: 29 %
MCH: 30.7 pg (ref 26.6–33.0)
MCHC: 33.3 g/dL (ref 31.5–35.7)
MCV: 92 fL (ref 79–97)
Monocytes Absolute: 0.6 10*3/uL (ref 0.1–0.9)
Monocytes: 17 %
Neutrophils Absolute: 1.7 10*3/uL (ref 1.4–7.0)
Neutrophils: 49 %
Platelets: 198 10*3/uL (ref 150–450)
RBC: 4.49 x10E6/uL (ref 4.14–5.80)
RDW: 11.6 % (ref 11.6–15.4)
WBC: 3.3 10*3/uL — ABNORMAL LOW (ref 3.4–10.8)

## 2020-10-16 LAB — VITAMIN D 25 HYDROXY (VIT D DEFICIENCY, FRACTURES): Vit D, 25-Hydroxy: 20.6 ng/mL — ABNORMAL LOW (ref 30.0–100.0)

## 2020-10-16 LAB — COMPREHENSIVE METABOLIC PANEL
ALT: 11 IU/L (ref 0–44)
AST: 14 IU/L (ref 0–40)
Albumin/Globulin Ratio: 2 (ref 1.2–2.2)
Albumin: 4.6 g/dL (ref 4.0–5.0)
Alkaline Phosphatase: 41 IU/L — ABNORMAL LOW (ref 44–121)
BUN/Creatinine Ratio: 8 — ABNORMAL LOW (ref 9–20)
BUN: 6 mg/dL (ref 6–24)
Bilirubin Total: 0.5 mg/dL (ref 0.0–1.2)
CO2: 22 mmol/L (ref 20–29)
Calcium: 9.6 mg/dL (ref 8.7–10.2)
Chloride: 102 mmol/L (ref 96–106)
Creatinine, Ser: 0.77 mg/dL (ref 0.76–1.27)
Globulin, Total: 2.3 g/dL (ref 1.5–4.5)
Glucose: 85 mg/dL (ref 65–99)
Potassium: 4.3 mmol/L (ref 3.5–5.2)
Sodium: 141 mmol/L (ref 134–144)
Total Protein: 6.9 g/dL (ref 6.0–8.5)
eGFR: 111 mL/min/{1.73_m2} (ref >59)

## 2020-10-16 LAB — PAN-ANCA
ANCA Proteinase 3: <3.5 U/mL (ref 0.0–3.5)
Atypical pANCA: <1:20 {titer}
C-ANCA: <1:20 {titer}
Myeloperoxidase Ab: <9 U/mL (ref 0.0–9.0)
P-ANCA: <1:20 {titer}

## 2020-10-16 LAB — ANGIOTENSIN CONVERTING ENZYME: Angio Convert Enzyme: 69 U/L (ref 14–82)

## 2020-11-02 ENCOUNTER — Ambulatory Visit
Admission: RE | Admit: 2020-11-02 | Discharge: 2020-11-02 | Disposition: A | Discharge status: Home / Self Care | Payer: No Typology Code available for payment source | Source: Ambulatory Visit | Attending: Pulmonary Disease | Admitting: Pulmonary Disease

## 2020-11-02 DIAGNOSIS — R599 Enlarged lymph nodes, unspecified: Secondary | ICD-10-CM

## 2020-11-02 IMAGING — CT CT CHEST HIGH RESOLUTION W/O CM
1 of 5 series · 15 of 31 positions shown, 19 images · non-contrast
Comparison: CT chest, [DATE]

CLINICAL DATA: Mediastinal adenopathy, possible sarcoidosis

EXAM:
CT CHEST WITHOUT CONTRAST
TECHNIQUE: Multidetector CT imaging of the chest was performed following the
standard protocol without intravenous contrast. High resolution
imaging of the lungs, as well as inspiratory and expiratory imaging,
was performed.

[Series 2: chest · axial · 0.81mm/px · z∈[-298,-22]mm · 15 of 158 slices shown, 19 images]
[im 10/158  mediastinal]
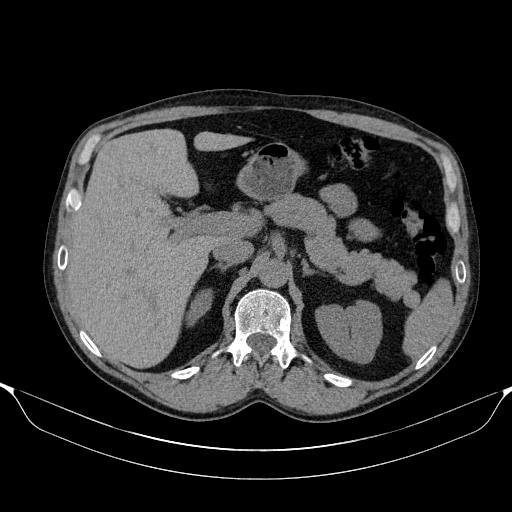
[im 10/158  lung]
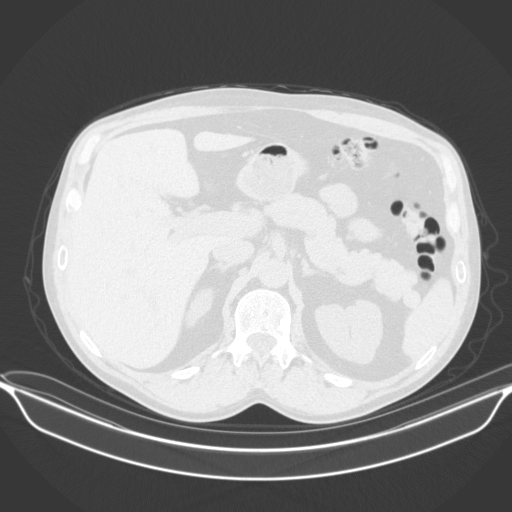
[im 19/158  lung]
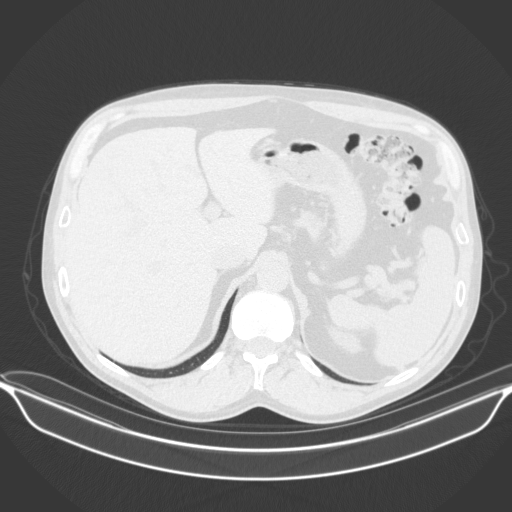
[im 28/158  lung]
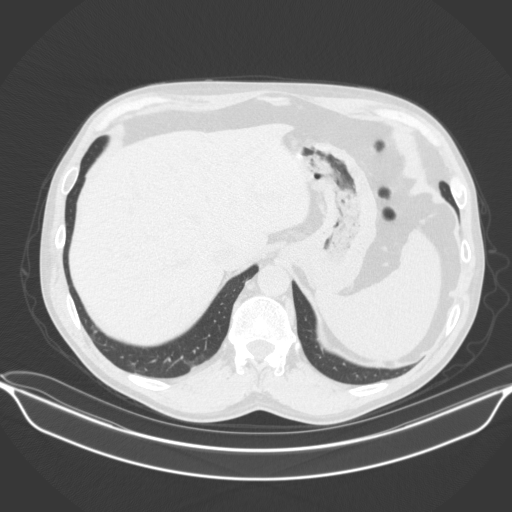
[im 37/158  lung]
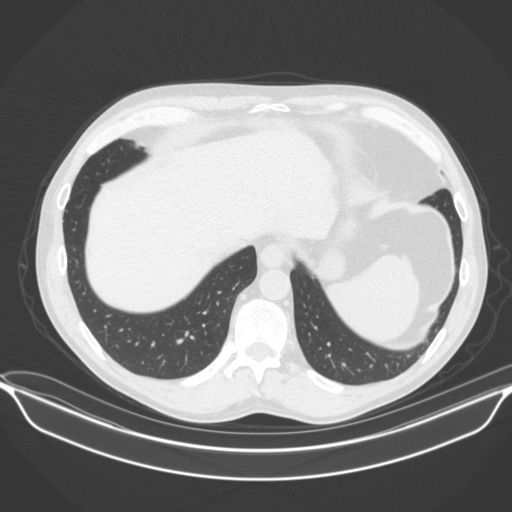
[im 56/158  mediastinal]
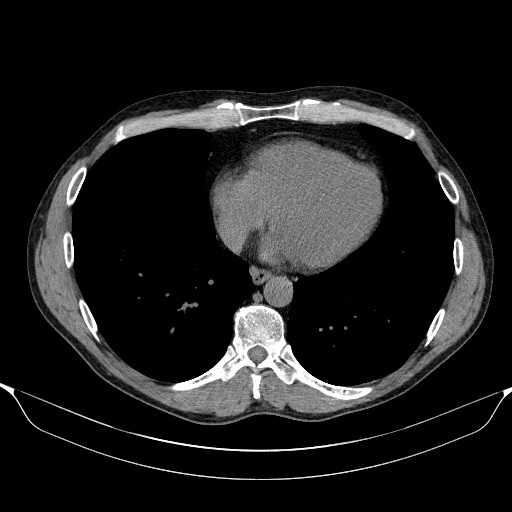
[im 56/158  lung]
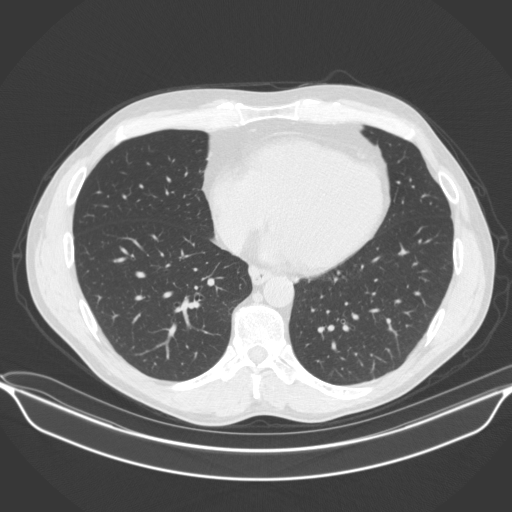
[im 65/158  lung]
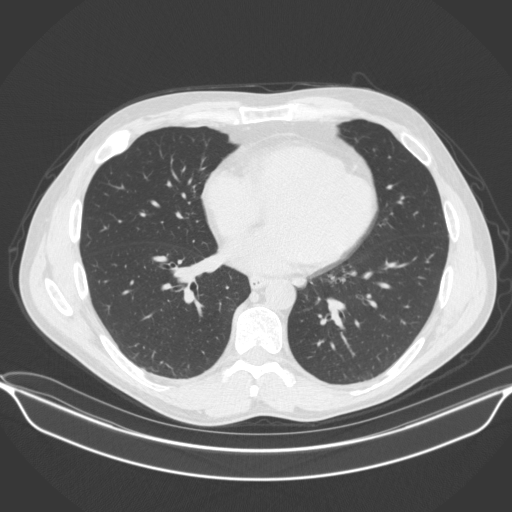
[im 66/158  lung]
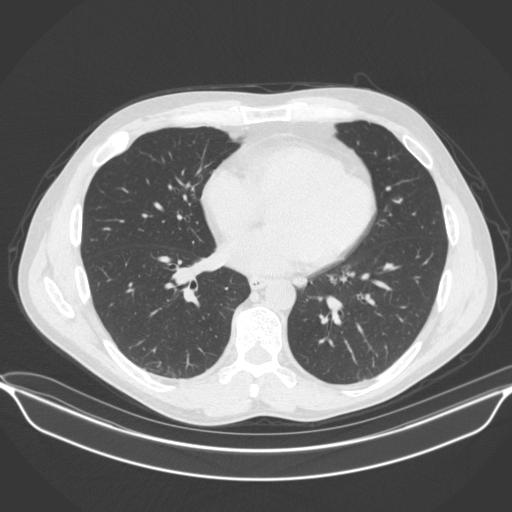
[im 74/158  lung]
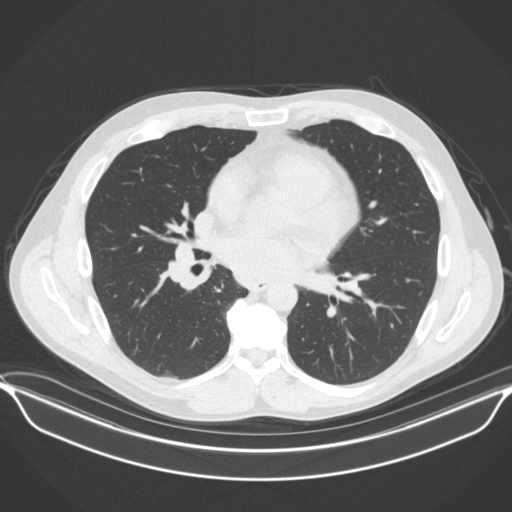
[im 84/158  mediastinal]
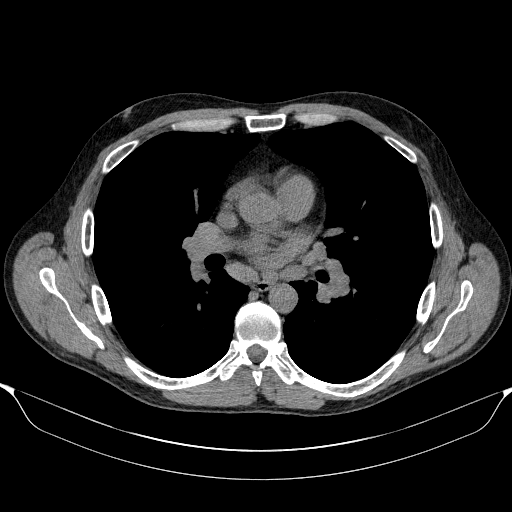
[im 84/158  lung]
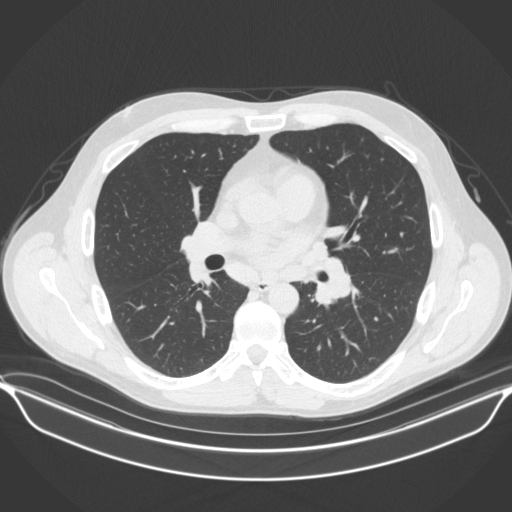
[im 93/158  lung]
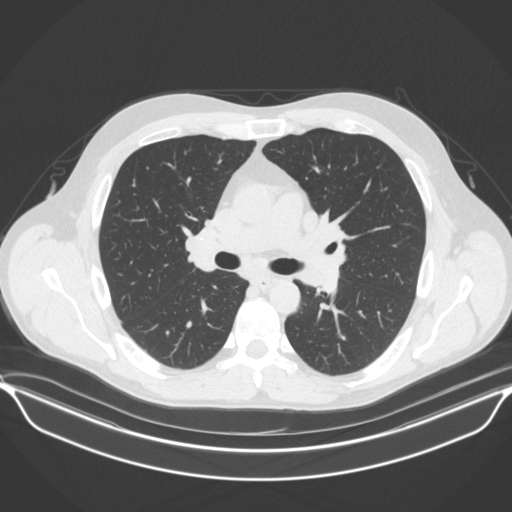
[im 102/158  lung]
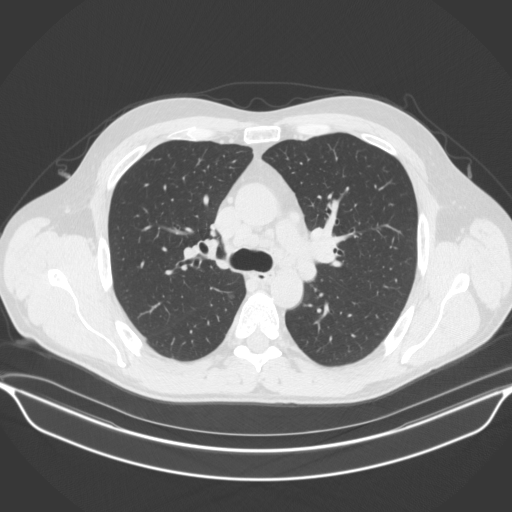
[im 121/158  lung]
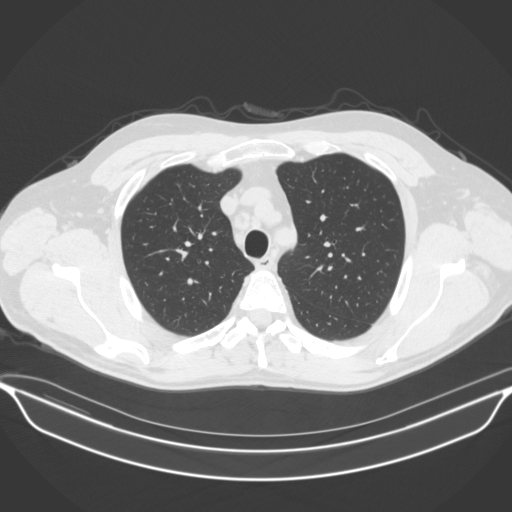
[im 130/158  mediastinal]
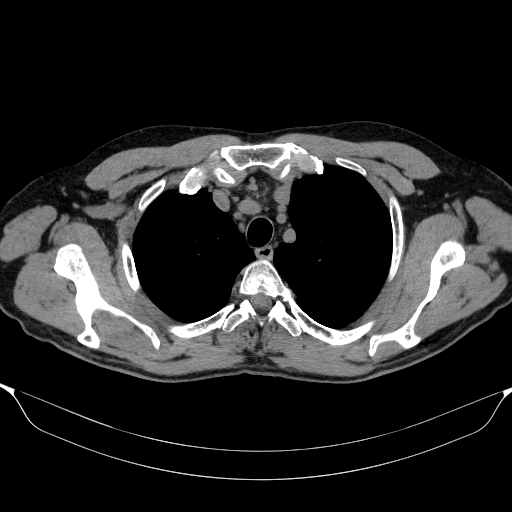
[im 130/158  lung]
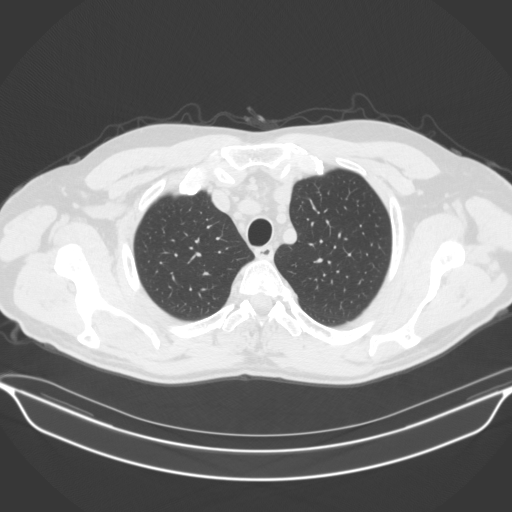
[im 139/158  lung]
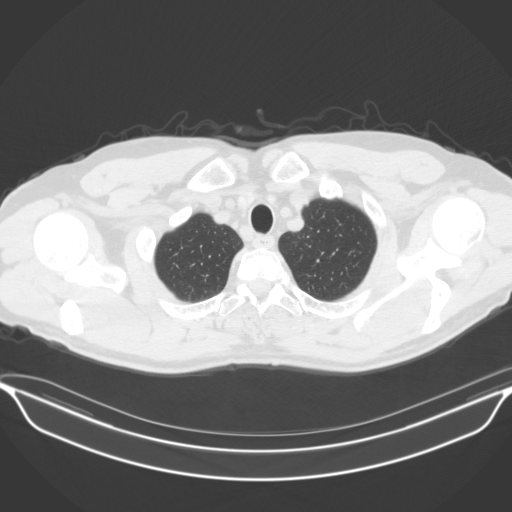
[im 148/158  lung]
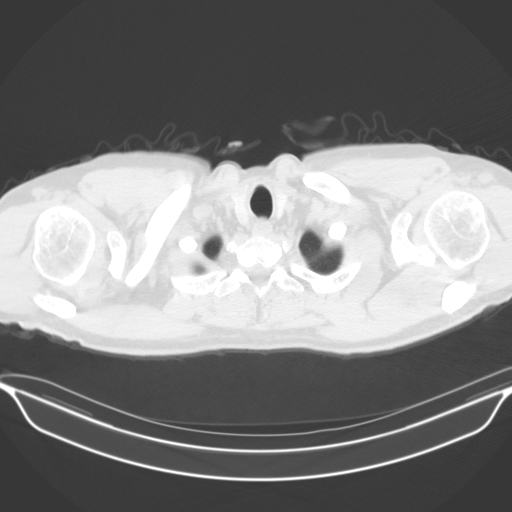

[15 of 31 positions shown; findings below may reference images not displayed]

FINDINGS: Cardiovascular: No significant vascular findings. Normal heart size.
No pericardial effusion.

Mediastinum/Nodes: Numerous enlarged mediastinal and bilateral hilar
lymph nodes, unchanged compared to prior examination. Largest
pretracheal nodes measure 2.5 x 1.1 cm (series 2, image 54), largest
subcarinal node measures 2.7 x 1.5 cm (series 2, image 73). Thyroid
gland, trachea, and esophagus demonstrate no significant findings.

Lungs/Pleura: Stable, benign 5 mm pulmonary nodule of the right
lower lobe (series 7, image 112) as well as a 3 mm nodule of the
anterior left lower lobe (series 7, image 76). No pleural effusion
or pneumothorax.

Upper Abdomen: No acute abnormality. Redemonstrated rim calcified
aneurysm of the distal splenic artery measuring up to 2.0 x 1.4 cm
(series 2, image 145).

Musculoskeletal: No chest wall mass or suspicious bone lesions
identified.
IMPRESSION: 1. Numerous enlarged mediastinal and bilateral hilar lymph nodes,
unchanged compared to prior examination. These remain nonspecific
and may be seen in sarcoidosis. Interval stability is reassuring,
however malignancy is not strictly excluded.
2. No evidence of pulmonary parenchymal sarcoidosis or other
fibrotic interstitial lung disease.
3. Stable, benign pulmonary nodules measuring 5 mm or smaller. No
further follow-up or characterization is required.
4. Redemonstrated rim calcified aneurysm of the distal splenic
artery measuring up to 2.0 x 1.4 cm.

## 2020-12-19 ENCOUNTER — Encounter: Payer: Self-pay | Admitting: *Deleted

## 2021-01-25 ENCOUNTER — Telehealth: Payer: Self-pay | Admitting: Pulmonary Disease

## 2021-04-21 ENCOUNTER — Encounter: Payer: Self-pay | Admitting: Neurology

## 2021-04-21 ENCOUNTER — Ambulatory Visit: Payer: 59 | Admitting: Neurology

## 2021-04-21 VITALS — BP 117/79 | HR 90 | Ht 71.5 in | Wt 186.0 lb

## 2021-04-21 DIAGNOSIS — Z8661 Personal history of infections of the central nervous system: Secondary | ICD-10-CM | POA: Diagnosis not present

## 2021-04-21 DIAGNOSIS — Z8669 Personal history of other diseases of the nervous system and sense organs: Secondary | ICD-10-CM

## 2021-04-21 DIAGNOSIS — G0490 Encephalitis and encephalomyelitis, unspecified: Secondary | ICD-10-CM

## 2021-04-21 DIAGNOSIS — R42 Dizziness and giddiness: Secondary | ICD-10-CM | POA: Diagnosis not present

## 2021-04-21 MED ORDER — DEXAMETHASONE 1 MG PO TABS
ORAL_TABLET | ORAL | 2 refills | Status: DC
Start: 2021-04-21 — End: 2023-08-31

## 2021-04-21 NOTE — Progress Notes (Signed)
[facility]  PATIENT: Aaron Chapman DOB: 1976-02-22  REFERRING DOCTOR OR PCP:  None SOURCE: Patient (through interpreter), notes from hospitalization, imaging and lab reports, multiple imaging studies personally reviewed.  _________________________________   HISTORICAL  CHIEF COMPLAINT:  Chief Complaint  Patient presents with   Follow-up    Pt with interpreter and wife rm 1. Overall doing well, no concerns     HISTORY OF PRESENT ILLNESS:  Aaron Chapman is a 45 y.o. man with noninfectious meningitis and abnormal brain MRI    Update 04/21/2021: Completed the Decadron last month.  He does feel more fatigue and slightly more off balance when he is off the Decadron compared to how he felt when he was on it.  He tolerated Decadron better than prednisone.  His last dose was 1 mg 3 times a day.  He denies any significant problem with headache, they have not returned.  Additionally, the neck pain improved and he no longer experiences it..  His gait is monoblock surface but he feels a little off balance at times, especially after he stands up if he turns quickly. Vision is fine.  He still reports that fatigue is a daily problem for him, though he did better the last few months on steroid.  Repeat MRI 10/02/2020 showed more inflammation than the previous MRI from 06/16/2020.  Therefore, I have wanted him to go back on the Decadron.  He has not yet started and we discussed this in further detail.  He has an unusual MRI and history.  He had a neurosurgical operation involving the right cerebellar pontine angle due to right trigeminal neuralgia (V1 and V2).  Symptoms greatly improved after the surgery in 2015.  It is certainly possible that some of the enhancement seen at the cerebellopontine angle are sequela of the surgery.  In the September 2021 MRI, there was clear information on MRI that also involved abnormal signal in the right middle cerebellar  peduncle and the cerebellar sulci.  CSF was abnormal.  We have been most concerned about either vasculitis or neurosarcoid (has mediastinal lymphadenopathy and granulomatous changes).  Angiotensin-converting enzyme levels and pan ANCA have not been elevated but these were measured after steroids started.  History of meningitis: He presented to the emergency room 04/21/2020 with fever of unknown origin.  Of note, he had had COVID-19 on August 14 and had been febrile for 15 days afterwards before being fever free for a couple weeks.  Along with the fever, he had headaches, shortness of breath, nonproductive cough and some chest pain.  He had a T-max of 103-104.  He developed a nonpruritic rash on the trunk and limbs.  Due to the residual symptoms from COVID-19, he had a CT scan of the chest on 04/10/2020 which showed right lower lobe nodule, left lower lobe nodule and mediastinal and bilateral hilar lymphadenopathy.  While in the emergency room 04/21/2020 for the fever, the blood work was fine.  QuantiFERON gold and blood cultures were performed (these were negative).  He was referred to infectious disease as an outpatient.  He re-presented to the emergency room with worsening fever and headache on 04/25/2020.  He was admitted for further evaluation.  Lumbar puncture was abnormal with greatly elevated protein (357) and reduced glucose (23).  Additionally, MRI of the brain showed some leptomeningeal enhancement that was worse at the cerebellopontine angle on the right.  Some patchy parenchymal enhancement was noted in the right middle cerebellar peduncle.  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  However, AFB was negative.  Later in the admission, he had a bronchoscopy.  Pathology of the lymph node showed granulomatous type changes but no lymphoma.  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  This was  confirmed with conventional angiography on 05/05/2020.  It is small measuring 2.7 x 1.6 mm.  He was started on steroids while in the hospital.  Initially he was placed on medication for tuberculosis but as more lab work return that was discontinued.  He continued on dexamethasone 4 mg 3 times daily upon discharge.  He moved to the [place] from [place] January 2021.  He had received the COVID-19 [employer] vaccination about 3 months before his COVID-19 infection.  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  He has no history of tuberculosis.  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  He had surgery for this in 2015.  IMAGING Repeat brain MRI 06/16/2020 shows Focus of abnormal enhancement in the right cerebellopontine angle improved in size and intensity of enhancement compared to the 05/01/2020 MRI.  Elsewhere, the enhancement that had been seen in the right greater than left cerebellar hemispheric sulci has resolved.  Additionally, the increased signal in the right middle cerebellar peduncle assumed to be cerebritis has also resolved.  MRI Brain 10/02/2020 showed mild increase in the amount of enhancement and intensity in the right cerebellopontine angle.  This had decreased on the 06/16/2020 MRI but now is similar in appearance to the MRI from 05/01/2020.  However, there is no further enhancement within the cerebellar sulci.   Brain parenchyma appears normal.   REVIEW OF SYSTEMS: Constitutional: Fevers and fatigue as above  eyes: No visual changes, double vision, eye pain Ear, nose and throat: No hearing loss, ear pain, nasal congestion, sore throat Cardiovascular: No chest pain, palpitations Respiratory:  No shortness of breath at rest or with exertion.   No wheezes GastrointestinaI: No nausea, vomiting, diarrhea, abdominal pain, fecal incontinence Genitourinary:   No dysuria, urinary retention or frequency.  No nocturia. Musculoskeletal:  No neck pain, back pain Integumentary: He has a mild rash that is nonpruritic Neurological: as above Psychiatric: No depression at this time.  No anxiety Endocrine: No palpitations, diaphoresis, change in appetite, change in weigh or increased thirst Hematologic/Lymphatic:  No anemia, purpura, petechiae. Allergic/Immunologic: No itchy/runny eyes, nasal congestion, recent allergic reactions, rashes  ALLERGIES: Allergies  Allergen Reactions   Aspirin Swelling    Facial swelling    HOME MEDICATIONS:  Current Outpatient Medications:    Ascorbic Acid (VITAMIN C) 1000 MG tablet, Take 1,000 mg by mouth daily., Disp: , Rfl:    pantoprazole (PROTONIX) 40 MG tablet, Take 1 tablet (40 mg total) by mouth daily., Disp: 30 tablet, Rfl: 1   VITAMIN A PO, Take 1 capsule by mouth daily., Disp: , Rfl:    VITAMIN D, CHOLECALCIFEROL, PO, Take by mouth., Disp: , Rfl:    dexamethasone (DECADRON) 1 MG tablet, Take 1 pill by mouth two times daily, Disp: 180 tablet, Rfl: 2  PAST MEDICAL HISTORY: Past Medical History:  Diagnosis Date   COVID-19    positive 03/14/20 - tested at a clinic in [place] - unsure of name of clinic   Dyspnea    History of kidney stones    Lung  nodules    Pneumonia     PAST SURGICAL HISTORY: Past Surgical History:  Procedure Laterality Date   BRAIN SURGERY  2015   BRONCHIAL NEEDLE ASPIRATION BIOPSY  04/30/2020   Procedure: BRONCHIAL NEEDLE ASPIRATION BIOPSIES;  Surgeon: Lupita Leash, MD;  Location: [facility] ENDOSCOPY;  Service: Cardiopulmonary;;   BRONCHIAL WASHINGS  04/30/2020   Procedure: BRONCHIAL WASHINGS;  Surgeon: Lupita Leash, MD;  Location: [facility] ENDOSCOPY;  Service: Cardiopulmonary;;   ENDOBRONCHIAL ULTRASOUND N/A 04/30/2020   Procedure: ENDOBRONCHIAL ULTRASOUND;  Surgeon: Lupita Leash, MD;  Location: [facility] ENDOSCOPY;  Service: Cardiopulmonary;  Laterality: N/A;   IR 3D INDEPENDENT  WKST  05/05/2020   IR ANGIO INTRA EXTRACRAN SEL COM CAROTID INNOMINATE BILAT MOD SED  05/05/2020   IR ANGIO VERTEBRAL SEL VERTEBRAL UNI L MOD SED  05/05/2020   IR ANGIO VERTEBRAL SEL VERTEBRAL UNI R MOD SED  05/04/2020   IR US GUIDE VASC ACCESS RIGHT  05/04/2020   KIDNEY STONE SURGERY     removal   TONSILLECTOMY     VIDEO BRONCHOSCOPY N/A 04/30/2020   Procedure: VIDEO BRONCHOSCOPY WITHOUT FLUORO;  Surgeon: Lupita Leash, MD;  Location: [facility] ENDOSCOPY;  Service: Cardiopulmonary;  Laterality: N/A;   WISDOM TOOTH EXTRACTION      FAMILY HISTORY: Family History  Problem Relation Age of Onset   Hypertension Mother    Diabetes Mother    Pneumonia Father    Bladder Cancer Father     SOCIAL HISTORY:  Social History   Socioeconomic History   Marital status: Married    Spouse name: Hilda Lias Chirinos   Number of children: 1   Years of education: Primary school   Highest education level: Not on file  Occupational History   Occupation: Holiday representative  Tobacco Use   Smoking status: Never   Smokeless tobacco: Never  Vaping Use   Vaping Use: Not on file  Substance and Sexual Activity   Alcohol use: Yes    Comment: occasional    Drug use: Not Currently   Sexual activity: Not on file  Other Topics Concern   Not on file  Social History Narrative   Caffeine use: daily (coffee)   Right handed    Social Determinants of Health   Financial Resource Strain: Not on file  Food Insecurity: Not on file  Transportation Needs: Not on file  Physical Activity: Not on file  Stress: Not on file  Social Connections: Not on file  Intimate Partner Violence: Not on file     PHYSICAL EXAM  Vitals:   04/21/21 1107  BP: 117/79  Pulse: 90  Weight: 186 lb (84.4 kg)  Height: 5' 11.5" (1.816 m)    Body mass index is 25.58 kg/m.   General: The patient is well-developed and well-nourished and in no acute distress  HEENT: Sclera are anicteric.   Neck: No carotid bruits are noted.  The neck is  nontender.  Cardiovascular: The heart has a regular rate and rhythm with a normal S1 and S2. There were no murmurs, gallops or rubs.    Skin: He has a mild rash predominantly in the chest and abdomen/flanks..  Musculoskeletal:  Back is nontender  Neurologic Exam  Mental status: The patient is alert and oriented x 3 at the time of the examination. The patient has apparent normal recent and remote memory, with an apparently normal attention span and concentration ability.   Speech is normal.  Cranial nerves: Extraocular movements are full.  Facial strength and sensation was normal.  No obvious hearing deficits are noted.  Motor:  Muscle bulk is normal.   Tone is normal. Strength is  5 / 5 in all 4 extremities.   Sensory: Sensory testing is intact to pinprick, soft touch and vibration sensation in all 4 extremities.  Coordination: Cerebellar testing reveals good finger-nose-finger and heel-to-shin bilaterally.  Gait and station: Station is normal.   Gait is normal though his turn is wide. Tandem gait is mildly wide. Romberg is negative.   Reflexes: Deep tendon reflexes are symmetric and normal in arms, 3 at the knees and 2 at the ankles.Marland Kitchen       DIAGNOSTIC DATA (LABS, IMAGING, TESTING) - I reviewed patient records, labs, notes, testing and imaging myself where available.  Lab Results  Component Value Date   WBC 3.3 (L) 10/14/2020   HGB 13.8 10/14/2020   HCT 41.5 10/14/2020   MCV 92 10/14/2020   PLT 198 10/14/2020      Component Value Date/Time   NA 141 10/14/2020 1104   K 4.3 10/14/2020 1104   CL 102 10/14/2020 1104   CO2 22 10/14/2020 1104   GLUCOSE 85 10/14/2020 1104   GLUCOSE 117 (H) 05/04/2020 0443   BUN 6 10/14/2020 1104   CREATININE 0.77 10/14/2020 1104   CALCIUM 9.6 10/14/2020 1104   PROT 6.9 10/14/2020 1104   ALBUMIN 4.6 10/14/2020 1104   AST 14 10/14/2020 1104   ALT 11 10/14/2020 1104   ALKPHOS 41 (L) 10/14/2020 1104   BILITOT 0.5 10/14/2020 1104   GFRNONAA  >60 05/04/2020 0443   GFRAA >60 05/04/2020 0443       ASSESSMENT AND PLAN  Brain inflammation - Plan: MR BRAIN W WO CONTRAST  History of meningitis - Plan: MR BRAIN W WO CONTRAST  History of trigeminal neuralgia - Plan: MR BRAIN W WO CONTRAST  Vertigo - Plan: MR BRAIN W WO CONTRAST   1.    Etiology of his abnormal MRI symptoms is still unclear.  In the September 2021 MRI, there was clear inflammation on MRI that also involved abnormal signal in the right middle cerebellar peduncle and the cerebellar sulci.  CSF was abnormal.  We have been most concerned about either vasculitis or neurosarcoid (has mediastinal lymphadenopathy and granulomatous changes).  Angiotensin-converting enzyme levels and pan ANCA have not been elevated but these were measured after steroids started. 2.   Decadron 1 mg twice a day..  I might increase this to 3 times a day dose he was on last month) if he felt he did better on the higher dose.   Ultimately, we may need to consider a steroid sparing agent.   3.   Due to balance being a little bit worse this month and last month, repeat MRI of the brain to determine if inflammation has worsened.  4.   Return to see me in 6 months.  Call sooner if new or worsening symptoms.  Results need to be called through interpreter.     Narayan Scull A. Epimenio Foot, MD, [facility] 04/21/2021, 12:11 PM Certified in Neurology, Clinical Neurophysiology, Sleep Medicine and Neuroimaging  [facility] [address] [place], [place] [address] [phone]

## 2021-04-26 ENCOUNTER — Telehealth: Payer: Self-pay | Admitting: Neurology

## 2021-04-26 NOTE — Telephone Encounter (Signed)
[facility] auth: [id number] (exp. 04/26/21 to 05/25/21) order sent to GI, bc [facility] is not in network for Mri's. They will reach out to the patient to schedule.

## 2021-08-19 ENCOUNTER — Telehealth: Payer: Self-pay | Admitting: Neurology

## 2021-08-19 NOTE — Telephone Encounter (Signed)
Pt rescheduled from 3/28 to 4/4 at 8:30 am due to Dr. Felecia Shelling being out of office.

## 2021-10-26 ENCOUNTER — Ambulatory Visit: Payer: 59 | Admitting: Neurology

## 2021-11-02 ENCOUNTER — Encounter: Payer: Self-pay | Admitting: Neurology

## 2021-11-02 ENCOUNTER — Ambulatory Visit: Payer: Self-pay | Admitting: Neurology

## 2021-11-21 ENCOUNTER — Emergency Department (HOSPITAL_COMMUNITY)
Admission: EM | Admit: 2021-11-21 | Discharge: 2021-11-21 | Disposition: A | Discharge status: Home / Self Care | Payer: 59 | Attending: Emergency Medicine | Admitting: Emergency Medicine

## 2021-11-21 ENCOUNTER — Emergency Department (HOSPITAL_COMMUNITY): Payer: 59

## 2021-11-21 DIAGNOSIS — S6992XA Unspecified injury of left wrist, hand and finger(s), initial encounter: Secondary | ICD-10-CM | POA: Diagnosis not present

## 2021-11-21 DIAGNOSIS — T148XXA Other injury of unspecified body region, initial encounter: Secondary | ICD-10-CM

## 2021-11-21 DIAGNOSIS — S4991XA Unspecified injury of right shoulder and upper arm, initial encounter: Secondary | ICD-10-CM | POA: Diagnosis present

## 2021-11-21 DIAGNOSIS — S46911A Strain of unspecified muscle, fascia and tendon at shoulder and upper arm level, right arm, initial encounter: Secondary | ICD-10-CM | POA: Insufficient documentation

## 2021-11-21 IMAGING — CR DG HAND COMPLETE 3+V*L*
3 series · 3 of 3 positions shown · non-contrast
Comparison: None.

CLINICAL DATA: Left thumb pain

EXAM:
LEFT HAND - COMPLETE 3+ VIEW

[x hand pa left]
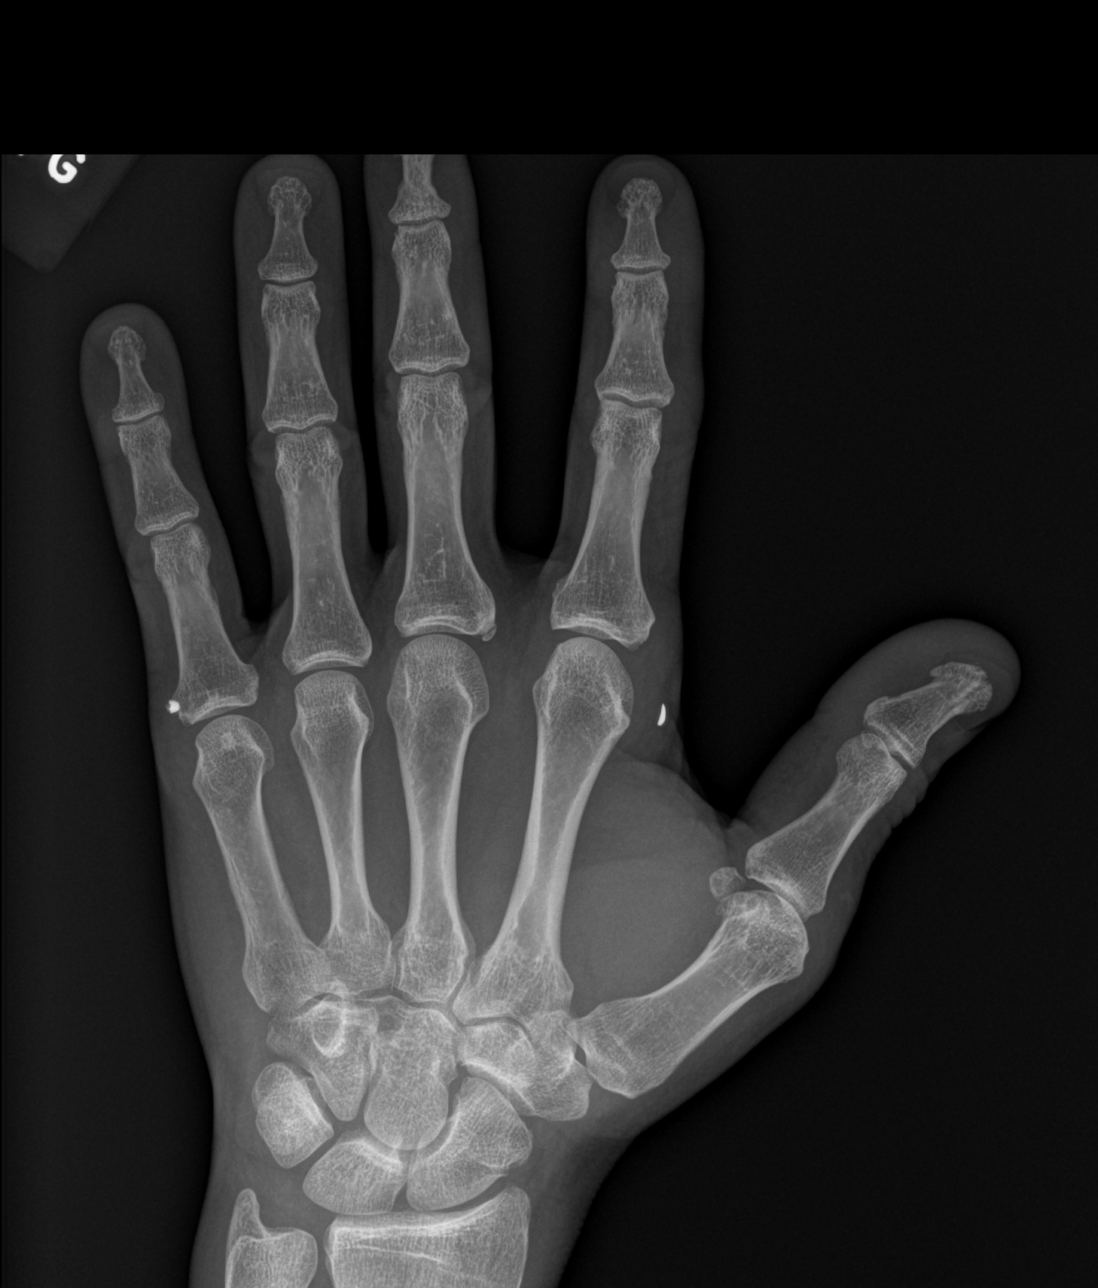

[x hand obl left]
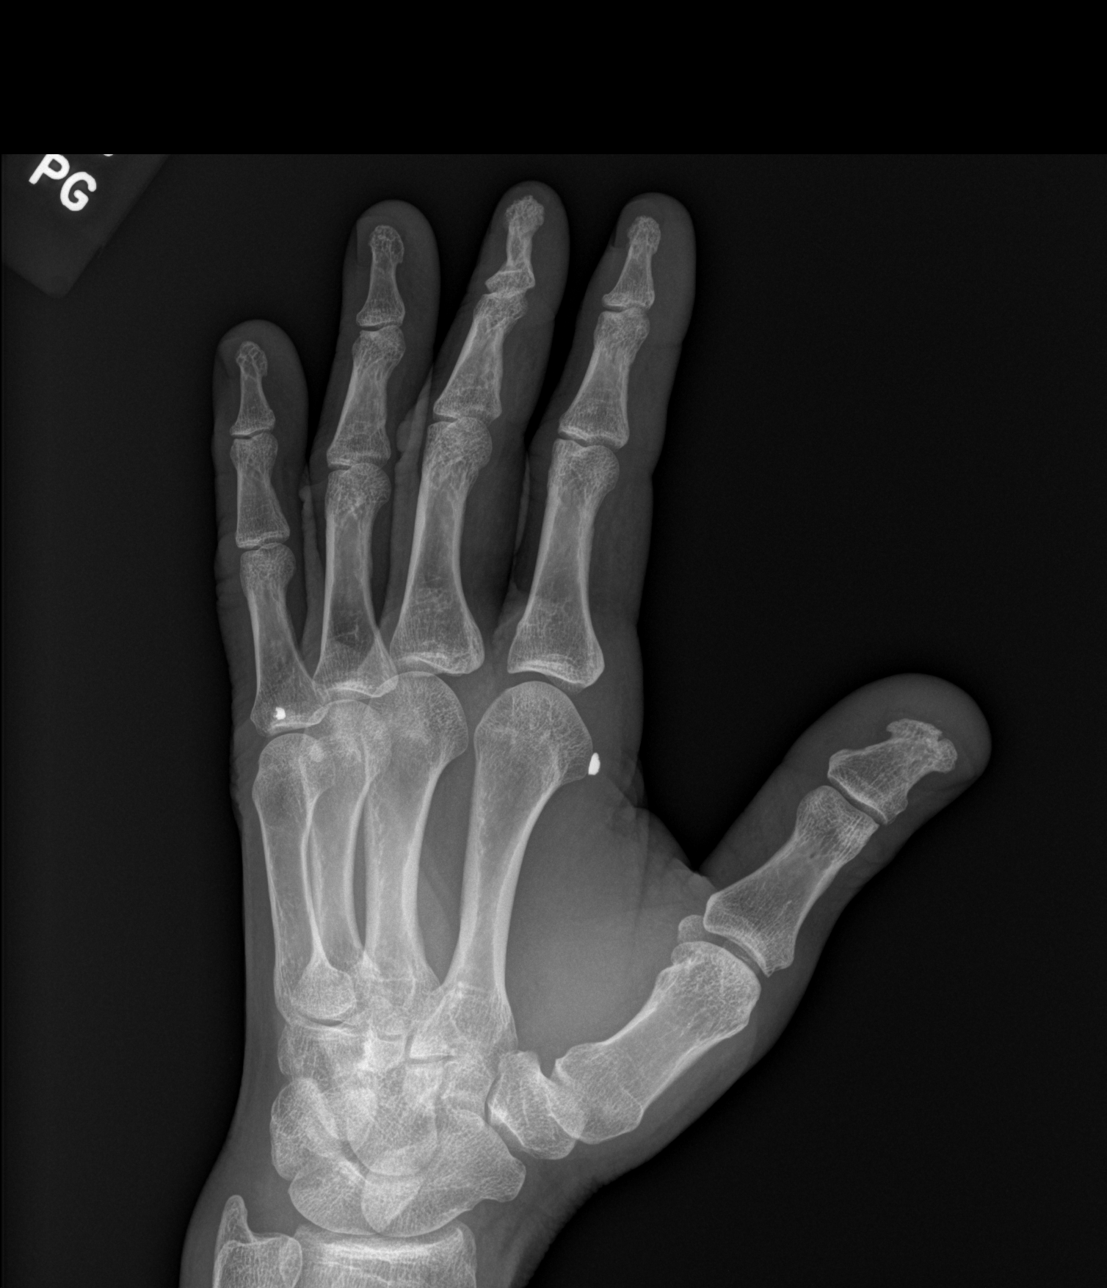

[x hand lat left]
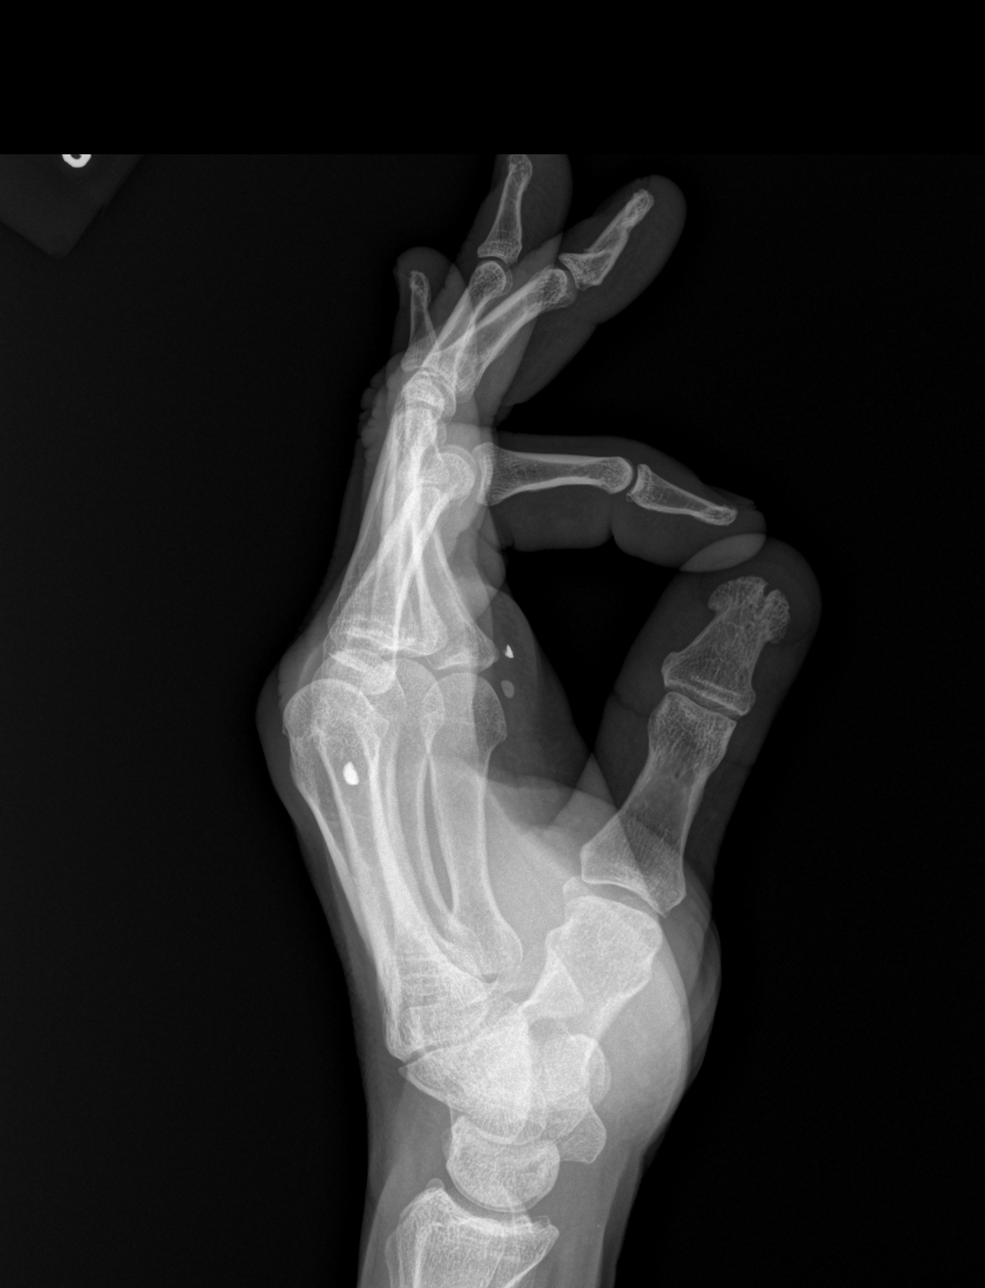

[3 of 3 positions shown; findings below may reference images not displayed]

FINDINGS: No acute fracture or malalignment. There is deformity of the third
distal phalanx likely related to a remote fracture. Vertical cleft
at the tuft of first distal phalanx does not appear acute. Small
foreign bodies adjacent to the head of second metacarpal and the
base of fifth proximal phalanx.
IMPRESSION: 1. No definite acute osseous abnormality
2. Chronic appearing deformity of the third distal phalanx
suggestive of a prior fracture
3. Small foreign bodies as above

## 2021-11-21 IMAGING — CR DG SHOULDER 2+V*R*
3 series · 3 of 3 positions shown · non-contrast
Comparison: None.

CLINICAL DATA: Shoulder pain

EXAM:
RIGHT SHOULDER - 2+ VIEW

[x shoulder axillary right]
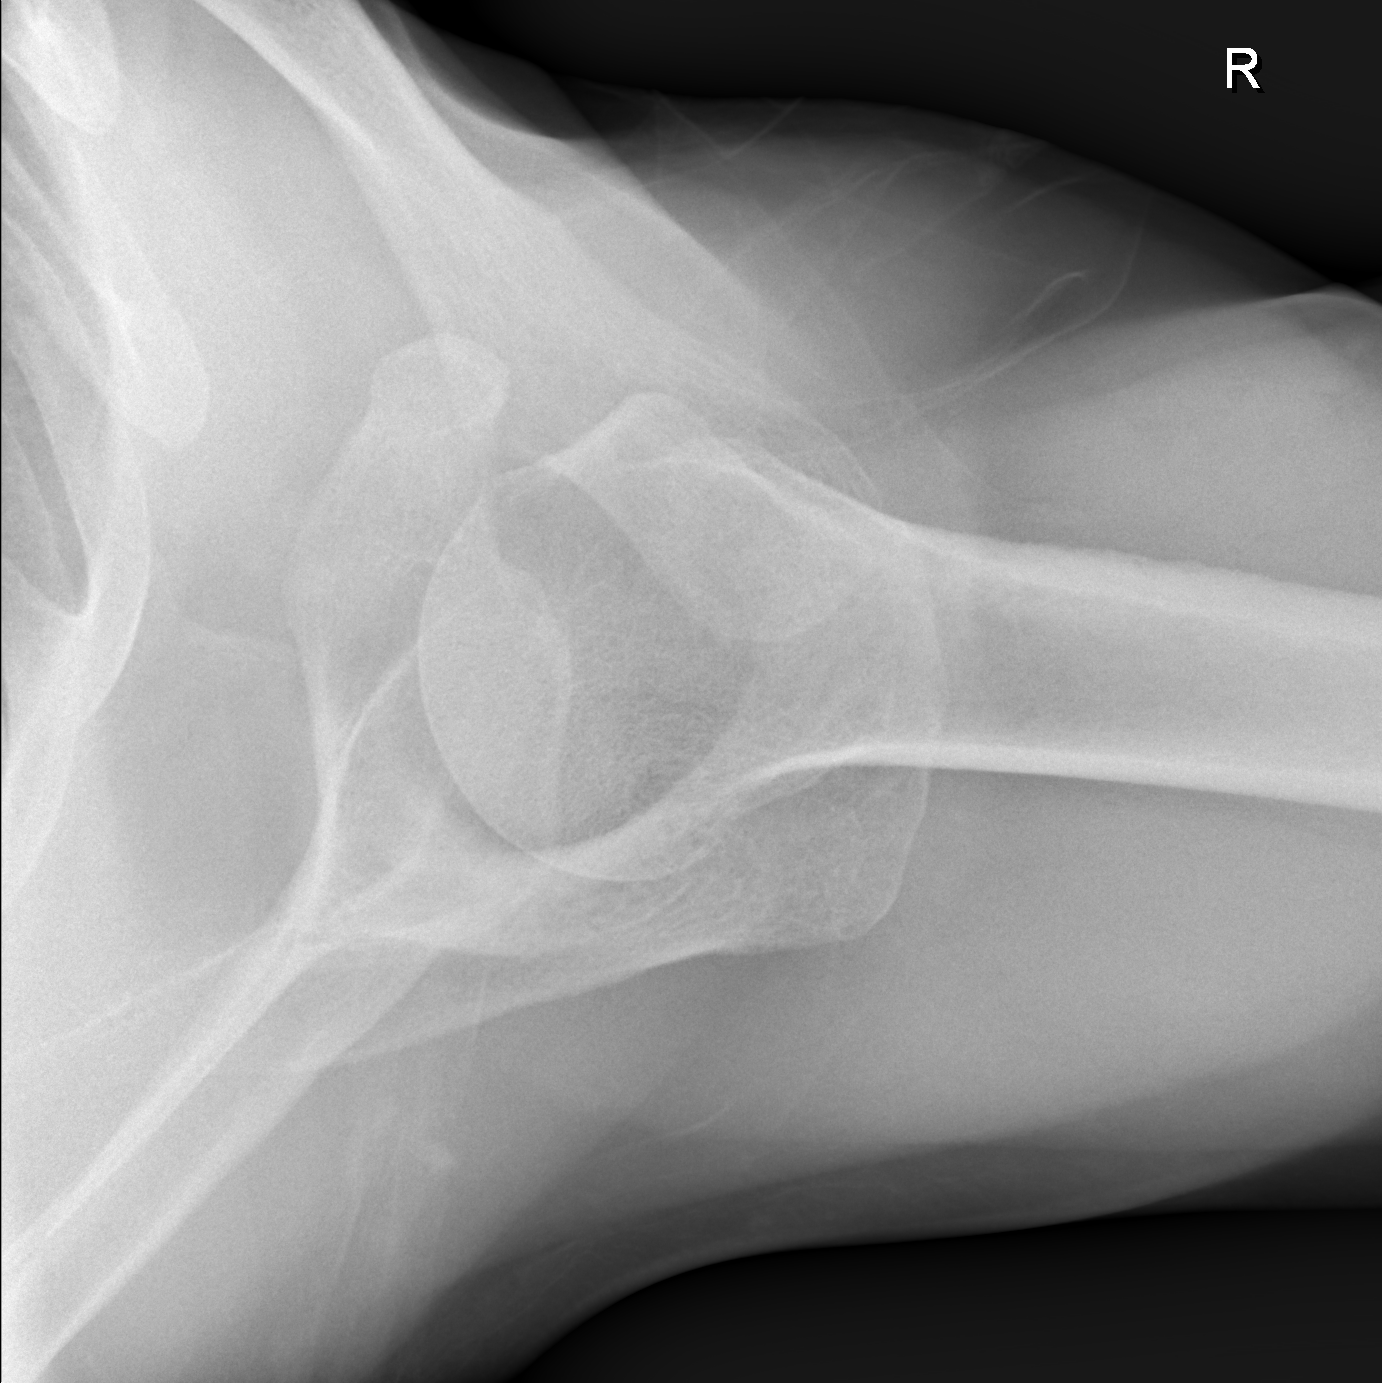

[w shoulder external right]
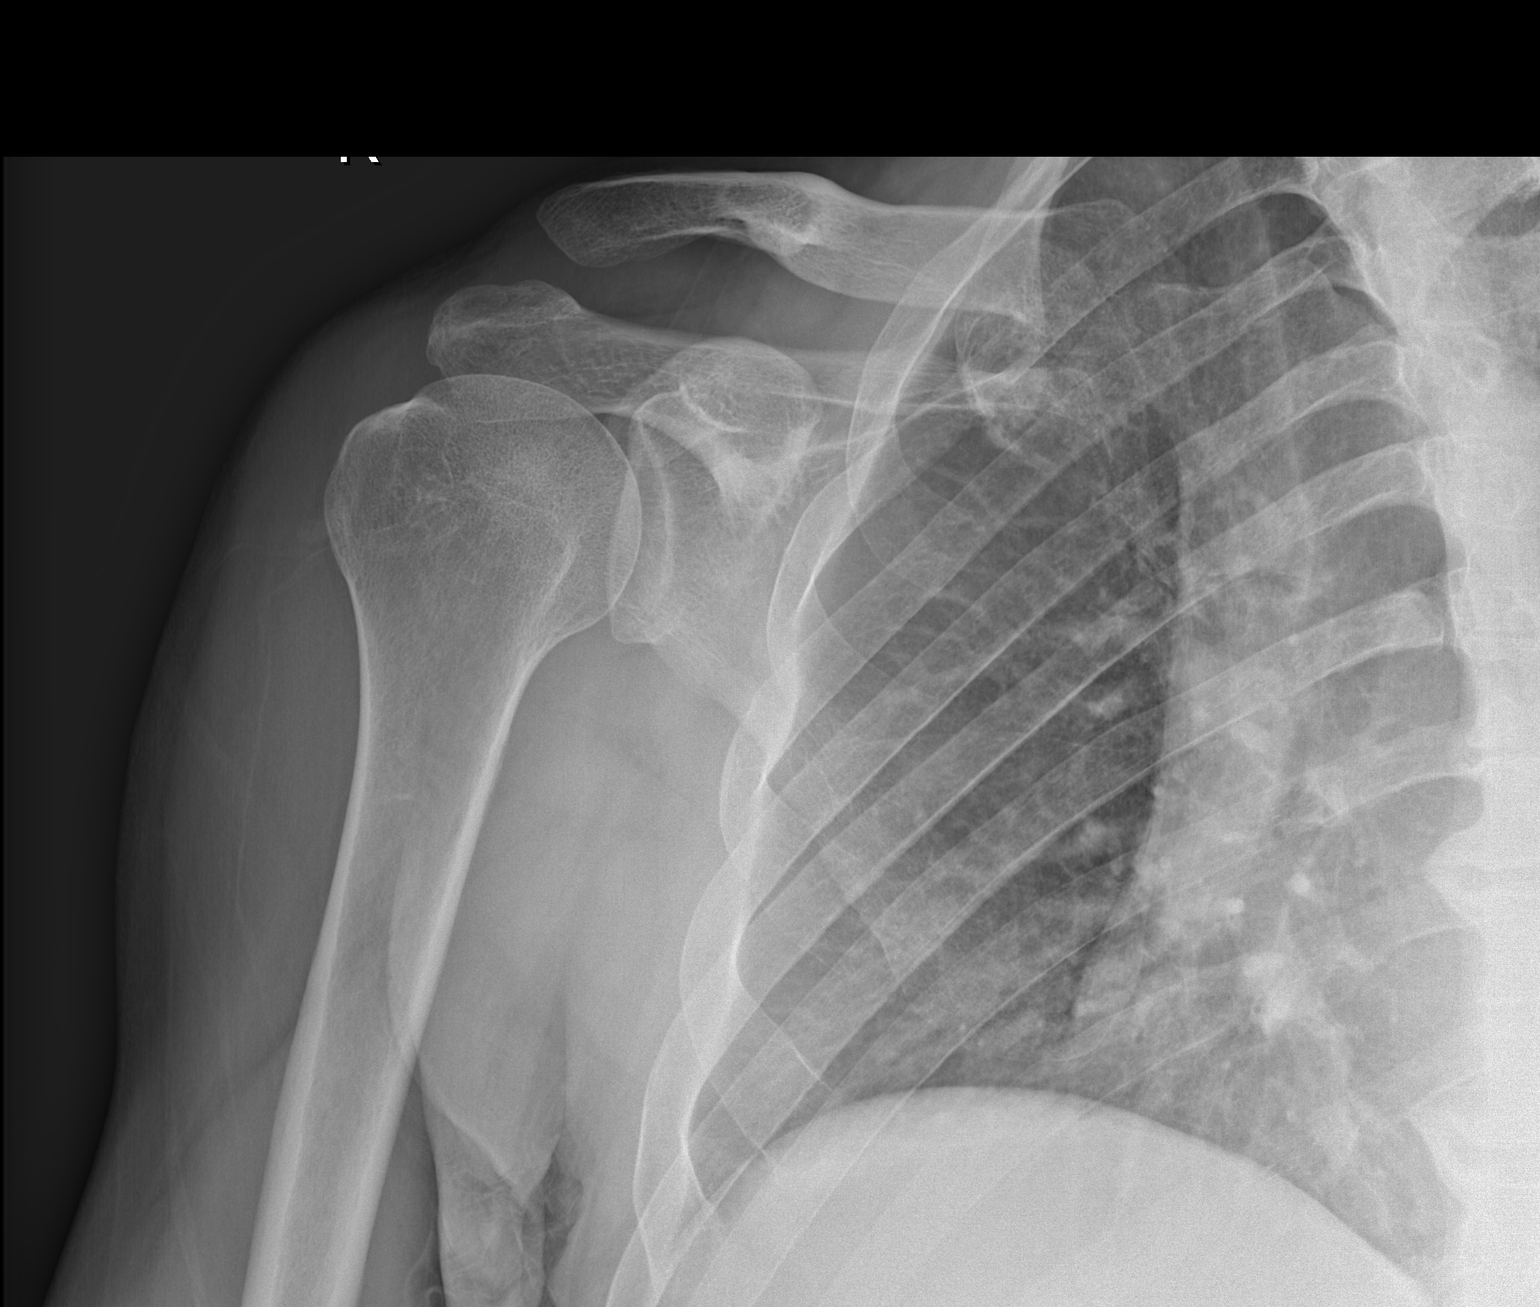

[w shoulder y-view right]
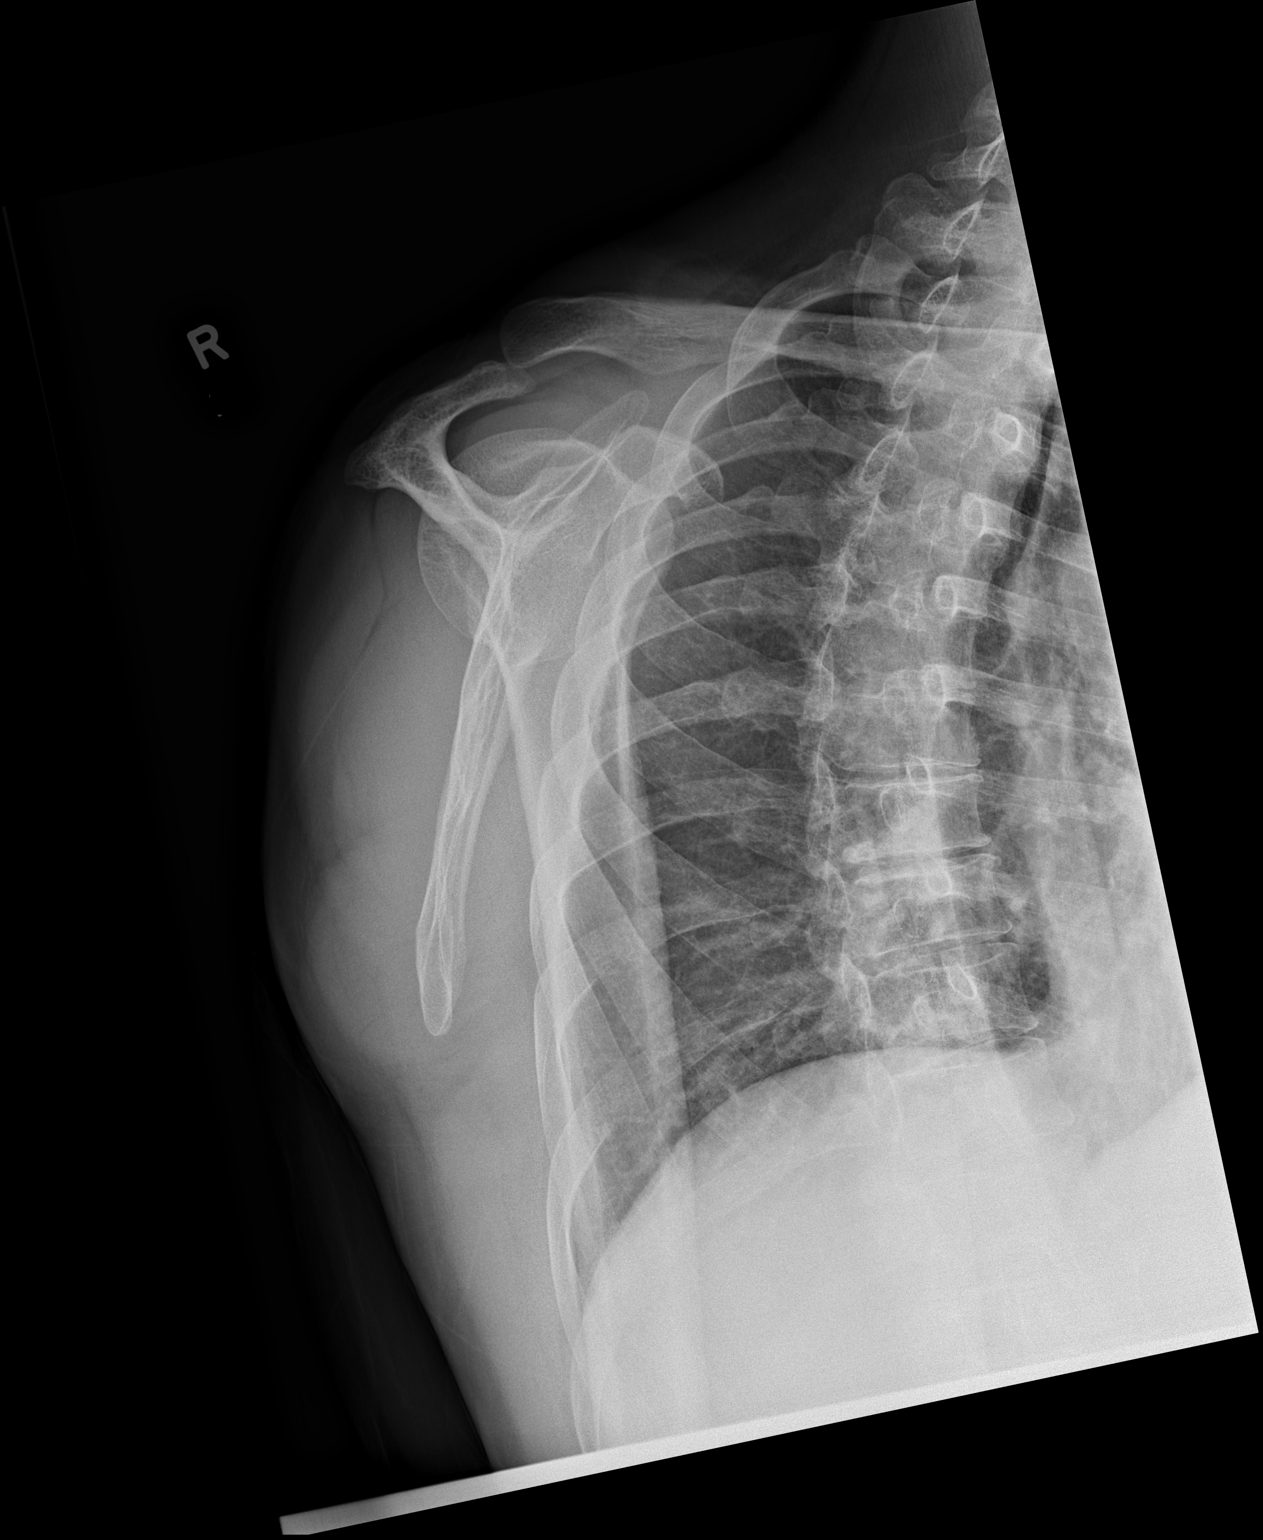

[3 of 3 positions shown; findings below may reference images not displayed]

FINDINGS: There is no evidence of fracture or dislocation. There is no
evidence of arthropathy or other focal bone abnormality. Soft
tissues are unremarkable.
IMPRESSION: Negative.
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
## 2021-11-21 MED ORDER — HYDROCODONE-ACETAMINOPHEN 5-325 MG PO TABS
1.0000 | ORAL_TABLET | Freq: Once | ORAL | Status: AC
  Administered 2021-11-21: 1 via ORAL
  Filled 2021-11-21: qty 1
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
## 2021-11-21 NOTE — Discharge Instructions (Signed)
Las radiograf?as realizadas hoy no muestran fractura u otros hallazgos significativos. ? ?Ha sufrido una contusi?n y abrasi?n despu?s de su ca?da. ? ?Aplique hielo y use un cabestrillo seg?n las instrucciones para Financial controller. Tome ibuprofeno (600 mg cada 8 horas) para Conservation officer, historic buildings. ?
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
## 2021-11-29 ENCOUNTER — Encounter: Payer: Self-pay | Admitting: Physician Assistant

## 2021-11-29 ENCOUNTER — Ambulatory Visit (INDEPENDENT_AMBULATORY_CARE_PROVIDER_SITE_OTHER): Payer: 59 | Admitting: Physician Assistant

## 2021-11-29 DIAGNOSIS — M25511 Pain in right shoulder: Secondary | ICD-10-CM
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
## 2021-12-14 ENCOUNTER — Ambulatory Visit: Payer: 59 | Admitting: Orthopaedic Surgery

## 2022-12-24 DIAGNOSIS — Z809 Family history of malignant neoplasm, unspecified: Secondary | ICD-10-CM | POA: Diagnosis not present

## 2022-12-24 DIAGNOSIS — Z8249 Family history of ischemic heart disease and other diseases of the circulatory system: Secondary | ICD-10-CM | POA: Diagnosis not present

## 2022-12-24 DIAGNOSIS — I10 Essential (primary) hypertension: Secondary | ICD-10-CM | POA: Diagnosis not present

## 2023-07-05 ENCOUNTER — Ambulatory Visit (HOSPITAL_COMMUNITY)
Admission: EM | Admit: 2023-07-05 | Discharge: 2023-07-05 | Disposition: A | Discharge status: Home / Self Care | Payer: 59 | Attending: Internal Medicine | Admitting: Internal Medicine

## 2023-07-05 ENCOUNTER — Ambulatory Visit (INDEPENDENT_AMBULATORY_CARE_PROVIDER_SITE_OTHER): Payer: 59

## 2023-07-05 ENCOUNTER — Encounter (HOSPITAL_COMMUNITY): Payer: Self-pay

## 2023-07-05 DIAGNOSIS — S6991XA Unspecified injury of right wrist, hand and finger(s), initial encounter: Secondary | ICD-10-CM

## 2023-07-05 DIAGNOSIS — S67190A Crushing injury of right index finger, initial encounter: Secondary | ICD-10-CM | POA: Diagnosis not present

## 2023-07-05 DIAGNOSIS — M79644 Pain in right finger(s): Secondary | ICD-10-CM | POA: Diagnosis not present

## 2023-07-05 NOTE — ED Triage Notes (Signed)
Patient here today with c/o right index finger injury yesterday afternoon. 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 Patient has some pain and swelling. He has taken IBU with some relief.

## 2023-07-05 NOTE — Discharge Instructions (Addendum)
I will call you if the radiologist sees any abnormality on your xray In the meantime, continue ibuprofen every 6 hours. Apply ice for 10-15 minutes several times daily, and elevate the hand to reduce swelling. Please follow with your orthopedic specialist if pain persisting.   Lo llamar si el radilogo ve alguna anomala en su radiografa. Mientras tanto, contine con el ibuprofeno cada 6 horas. Aplique hielo durante 10 a 15 minutos varias veces al da y levante la mano para reducir la hinchazn. Siga con su especialista en ortopedia si el dolor persiste.

## 2023-07-05 NOTE — ED Provider Notes (Signed)
[facility]    CSN: [id number] Arrival date & time: 07/05/23  1715     History   Chief Complaint Chief Complaint  Patient presents with   Finger Injury    HPI Aaron Chapman is a 47 y.o. male.  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 A small car part dropped on it. Pain is throbbing, rated 6/10 Has tried ibuprofen that helped with swelling  No prior injury known   Past Medical History:  Diagnosis Date   COVID-19    positive 03/14/20 - tested at a clinic in [place] - unsure of name of clinic   Dyspnea    History of kidney stones    Lung nodules    Pneumonia     Patient Active Problem List   Diagnosis Date Noted   Pain in right shoulder 11/29/2021   Vertigo 04/21/2021   Vitamin D deficiency 10/14/2020   Other fatigue 10/14/2020   History of meningitis 10/14/2020   History of trigeminal neuralgia 10/14/2020   Brain inflammation 05/13/2020   Fever and chills 04/26/2020   Bilateral headaches 04/26/2020   Meningitis 04/26/2020    Past Surgical History:  Procedure Laterality Date   BRAIN SURGERY  2015   BRONCHIAL NEEDLE ASPIRATION BIOPSY  04/30/2020   Procedure: BRONCHIAL NEEDLE ASPIRATION BIOPSIES;  Surgeon: Lupita Leash, MD;  [facility];  Service: Cardiopulmonary;;   BRONCHIAL WASHINGS  04/30/2020   Procedure: BRONCHIAL WASHINGS;  Surgeon: Lupita Leash, MD;  Location[facility];  Service: Cardiopulmonary;;   ENDOBRONCHIAL ULTRASOUND N/A 04/30/2020   Procedure: ENDOBRONCHIAL ULTRASOUND;  Surgeon: Lupita Leash, MD;  Location: [facility];  Service: Cardiopulmonary;  Laterality: N/A;   IR 3D INDEPENDENT WKST  05/05/2020   IR ANGIO INTRA EXTRACRAN SEL COM CAROTID INNOMINATE BILAT MOD SED  05/05/2020   IR ANGIO VERTEBRAL SEL VERTEBRAL UNI L MOD SED  05/05/2020   IR ANGIO VERTEBRAL SEL VERTEBRAL UNI R MOD SED  05/04/2020   IR US GUIDE VASC  ACCESS RIGHT  05/04/2020   KIDNEY STONE SURGERY     removal   TONSILLECTOMY     VIDEO BRONCHOSCOPY N/A 04/30/2020   Procedure: VIDEO BRONCHOSCOPY WITHOUT FLUORO;  Surgeon: Lupita Leash, MD;  Location[facility] END[facility]COPY;  Service: Cardiopulmonary;  Laterality: N/A;   WISDOM TOOTH EXTRACTION         Home Medications    Prior to Admission medications   Medication Sig Start Date End Date Taking? Authorizing Provider  Ascorbic Acid (VITAMIN C) 1000 MG tablet Take 1,000 mg by mouth daily.    [provider]  dexamethasone (DECADRON) 1 MG tablet Take 1 pill by mouth two times daily 04/21/21   Sater, Pearletha Furl, MD  pantoprazole (PROTONIX) 40 MG tablet Take 1 tablet (40 mg total) by mouth daily. 05/06/20   Burnadette Pop, MD  VITAMIN A PO Take 1 capsule by mouth daily.    [provider]  VITAMIN D, CHOLECALCIFEROL, PO Take by mouth.    [provider]    Family History Family History  Problem Relation Age of Onset   Hypertension Mother    Diabetes Mother    Pneumonia Father    Bladder Cancer Father     Social History Social History   Tobacco Use   Smoking status: Never   Smokeless tobacco: Never  Substance Use Topics   Alcohol use: Yes    Comment: occasional  Drug use: Not Currently     Allergies   Aspirin   Review of Systems Review of Systems Per HPI  Physical Exam Triage Vital Signs ED Triage Vitals  Encounter Vitals Group     BP      Systolic BP Percentile      Diastolic BP Percentile      Pulse      Resp      Temp      Temp src      SpO2      Weight      Height      Head Circumference      Peak Flow      Pain Score      Pain Loc      Pain Education      Exclude from Growth Chart    No data found.  Updated Vital Signs BP 135/87 (BP Location: Right Arm)   Pulse 85   Temp 99.3 F (37.4 C) (Oral)   Resp 16   Ht 5' 10.87" (1.8 m)   Wt 154 lb 5.2 oz (70 kg)   SpO2 95%   BMI 21.61 kg/m    Physical Exam Vitals  and nursing note reviewed.  Constitutional:      General: He is not in acute distress. Cardiovascular:     Rate and Rhythm: Normal rate and regular rhythm.     Pulses: Normal pulses.     Heart sounds: Normal heart sounds.  Pulmonary:     Effort: Pulmonary effort is normal.     Breath sounds: Normal breath sounds.  Musculoskeletal:     Right hand: Swelling and tenderness present. No deformity or lacerations. Decreased range of motion. Normal strength. Normal sensation. Normal capillary refill. Normal pulse.     Comments: Right hand index finger swelling. Tender dorsally over PIP to MCP. Decreased ROM due to swelling and pain. No obvious deformity. Cap refill < 2 seconds, sensation normal. Radial pulse 2+. There is no laceration or nail damage.   Skin:    General: Skin is warm and dry.     Capillary Refill: Capillary refill takes less than 2 seconds.  Neurological:     Mental Status: He is alert and oriented to person, place, and time.     UC Treatments / Results  Labs (all labs ordered are listed, but only abnormal results are displayed) Labs Reviewed - No data to display  EKG  Radiology DG Finger Index Right  Result Date: 07/05/2023 CLINICAL DATA:  Index finger injury smashed finger with swelling EXAM: RIGHT INDEX FINGER 2+V COMPARISON:  None Available. FINDINGS: No definitive fracture or malalignment. Possible punctate soft tissue foreign body adjacent to first distal phalanx. Punctate and linear soft tissue foreign bodies volar aspect second distal digit at the level of the tuft. Nail bed injury. Small 3 mm lucent lesion at the tuft, question small inclusion cyst. IMPRESSION: 1. No definitive fracture. 2. Punctate and linear soft tissue foreign bodies volar aspect second distal digit at the level of the tuft. Nail bed injury. 3. Possible punctate soft tissue foreign body adjacent to first distal phalanx. Electronically Signed   By: Jasmine Pang M.D.   On: 07/05/2023 19:28     Procedures Procedures  Medications Ordered in UC Medications - No data to display  Initial Impression / Assessment and Plan / UC Course  I have reviewed the triage vital signs and the nursing notes.  Pertinent labs & imaging results that were available during  my care of the patient were reviewed by me and considered in my medical decision making (see chart for details).  Right index finger xray without bony abnormality, initial read by this provider. There is incidental foreign body noted in the pad of index finger. No pain in this area and likely from an old incident. There is another small spot in the thumb pad. Discussed with patient.   Recommend symptomatic care, continue ibuprofen if helpful, advised ice and elevate to reduce swelling.  He does have an orthopedic specialist he could see if needed Patient is agreeable to plan. All questions answered    Radiology with similar interpretation.  Final Clinical Impressions(s) / UC Diagnoses   Final diagnoses:  Injury of right index finger, initial encounter  Pain of finger of right hand     Discharge Instructions      I will call you if the radiologist sees any abnormality on your xray In the meantime, continue ibuprofen every 6 hours. Apply ice for 10-15 minutes several times daily, and elevate the hand to reduce swelling. Please follow with your orthopedic specialist if pain persisting.   Lo llamar si el radilogo ve alguna anomala en su radiografa. Mientras tanto, contine con el ibuprofeno cada 6 horas. Aplique hielo durante 10 a 15 minutos varias veces al da y levante la mano para reducir la hinchazn. Siga con su especialista en ortopedia si el dolor persiste.     ED Prescriptions   None    PDMP not reviewed this encounter.   Kathrine Haddock 07/05/23 1601

## 2023-08-31 ENCOUNTER — Encounter: Payer: Self-pay | Admitting: Podiatry

## 2023-08-31 ENCOUNTER — Ambulatory Visit: Payer: 59 | Admitting: Podiatry

## 2023-08-31 DIAGNOSIS — L603 Nail dystrophy: Secondary | ICD-10-CM

## 2023-08-31 DIAGNOSIS — M778 Other enthesopathies, not elsewhere classified: Secondary | ICD-10-CM

## 2023-08-31 NOTE — Progress Notes (Signed)
Subjective:  Chapman ID: Aaron Chapman, male    DOB: 1975-10-19,  MRN: [id number] HPI Chief Complaint  Chapman presents with   Nail Problem    Toenails - thick and discolored nails x years, tried vinegar water soaks, OTC meds-no help   New [place] (Initial Visit)    48 y.o. male presents with Aaron above complaint.   ROS: Denies fever chills nausea vomit muscle aches pains calf pain back pain chest pain shortness of breath.   Past Medical History:  Diagnosis Date   COVID-19    positive 03/14/20 - tested at a clinic in [place] - unsure of name of clinic   Dyspnea    History of kidney stones    Lung nodules    Pneumonia    Past Surgical History:  Procedure Laterality Date   BRAIN SURGERY  2015   BRONCHIAL NEEDLE ASPIRATION BIOPSY  04/30/2020   Procedure: BRONCHIAL NEEDLE ASPIRATION BIOPSIES;  Surgeon: Aaron Leash, MD;  Location: [facility] ENDOSCOPY;  Service: Cardiopulmonary;;   BRONCHIAL WASHINGS  04/30/2020   Procedure: BRONCHIAL WASHINGS;  Surgeon: Aaron Leash, MD;  Location: [facility] ENDOSCOPY;  Service: Cardiopulmonary;;   ENDOBRONCHIAL ULTRASOUND N/A 04/30/2020   Procedure: ENDOBRONCHIAL ULTRASOUND;  Surgeon: Aaron Leash, MD;  Location[facility];  Service: Cardiopulmonary;  Laterality: N/A;   IR 3D INDEPENDENT WKST  05/05/2020   IR ANGIO INTRA EXTRACRAN SEL COM CAROTID INNOMINATE BILAT MOD SED  05/05/2020   IR ANGIO VERTEBRAL SEL VERTEBRAL UNI L MOD SED  05/05/2020   IR ANGIO VERTEBRAL SEL VERTEBRAL UNI R MOD SED  05/04/2020   IR US GUIDE VASC ACCESS RIGHT  05/04/2020   KIDNEY STONE SURGERY     removal   TONSILLECTOMY     VIDEO BRONCHOSCOPY N/A 04/30/2020   Procedure: VIDEO BRONCHOSCOPY WITHOUT FLUORO;  Surgeon: Aaron Leash, MD;  Location: [facility] ENDOSCOPY;  Service: Cardiopulmonary;  Laterality: N/A;   WISDOM TOOTH EXTRACTION      Current Outpatient Medications:    Ascorbic Acid (VITAMIN C) 1000 MG tablet, Take 1,000 mg by mouth daily., Disp:  , Rfl:    VITAMIN A PO, Take 1 capsule by mouth daily., Disp: , Rfl:    VITAMIN D, CHOLECALCIFEROL, PO, Take by mouth., Disp: , Rfl:   Allergies  Allergen Reactions   Aspirin Swelling    Facial swelling   Review of Systems Objective:  There were no vitals filed for this visit.  General: Well developed, nourished, in no acute distress, alert and oriented x3   Dermatological: Skin is warm, dry and supple bilateral. Nails x 10 are well maintained toenails and fingernails demonstrate thick yellow dystrophic clinically mycotic nails may be nail dystrophy there does not appear to be any rash to Aaron feet but states that has had it for years and years.  Aaron Chapman also states that Aaron Chapman other family members associated with it.; remaining integument appears unremarkable at this time. There are no open sores, no preulcerative lesions, no rash or signs of infection present.  Vascular: Dorsalis Pedis artery and Posterior Tibial artery pedal pulses are 2/4 bilateral with immedate capillary fill time. Pedal hair growth present. No varicosities and no lower extremity edema present bilateral.   Neruologic: Grossly intact via light touch bilateral. Vibratory intact via tuning fork bilateral. Protective threshold with Semmes Wienstein monofilament intact to all pedal sites bilateral. Patellar and Achilles deep tendon reflexes 2+ bilateral. No Babinski or clonus noted bilateral.   Musculoskeletal: No gross boney pedal deformities bilateral. No  pain, crepitus, or limitation noted with foot and ankle range of motion bilateral. Muscular strength 5/5 in all groups tested bilateral.  Gait: Unassisted, Nonantalgic.    Radiographs:  None taken   Assessment & Plan:   Assessment: Nail dystrophy cannot rule out onychomycosis.  Plan: Took samples of Aaron toenails today to be sent for pathologic evaluation we will follow-up with Aaron Chapman in 1 month to discuss findings.     Bill Yohn T. [place], [place]

## 2023-09-12 ENCOUNTER — Other Ambulatory Visit: Payer: Self-pay | Admitting: Podiatry

## 2023-09-22 DIAGNOSIS — Z20822 Contact with and (suspected) exposure to covid-19: Secondary | ICD-10-CM | POA: Diagnosis not present

## 2023-09-22 DIAGNOSIS — R059 Cough, unspecified: Secondary | ICD-10-CM | POA: Diagnosis not present

## 2023-09-22 DIAGNOSIS — J101 Influenza due to other identified influenza virus with other respiratory manifestations: Secondary | ICD-10-CM | POA: Diagnosis not present

## 2023-09-26 ENCOUNTER — Encounter: Payer: Self-pay | Admitting: Podiatry

## 2023-09-26 ENCOUNTER — Ambulatory Visit: Payer: 59 | Admitting: Podiatry

## 2023-09-26 DIAGNOSIS — L603 Nail dystrophy: Secondary | ICD-10-CM

## 2023-09-26 MED ORDER — TERBINAFINE HCL 250 MG PO TABS: 250.0000 mg | ORAL_TABLET | Freq: Every day | ORAL | 0 refills | Status: DC

## 2023-09-26 NOTE — Progress Notes (Signed)
 He presents today with his interpreter for follow-up of his nail pathology.  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  Assessment: Trichophyton rubrum onychomycosis.  Plan: We discussed etiology pathology conservative surgical therapies discussed topical therapy laser therapy and oral therapy.  At this point he decided oral therapy was will be best for him.  We did discuss the pros and cons of taking his medications and side effects and the risk associated with that he understands that and is amendable to it.  He will follow-up with me in 30 days after the first 30 doses of Lamisil for blood work.  I will most likely run him on 120-day dose.  We did write a prescription and send it over electronically for Lamisil 250 mg tablets.  He will take 1 tablet daily by mouth follow-up with me in 30 days or call with any side effects or allergies.

## 2023-10-24 ENCOUNTER — Ambulatory Visit: Payer: 59 | Admitting: Podiatry

## 2023-10-24 ENCOUNTER — Encounter: Payer: Self-pay | Admitting: Podiatry

## 2023-10-24 DIAGNOSIS — L603 Nail dystrophy: Secondary | ICD-10-CM | POA: Diagnosis not present

## 2023-10-24 DIAGNOSIS — Z79899 Other long term (current) drug therapy: Secondary | ICD-10-CM

## 2023-10-24 MED ORDER — TERBINAFINE HCL 250 MG PO TABS: 250.0000 mg | ORAL_TABLET | Freq: Every day | ORAL | 0 refills | Status: AC

## 2023-10-24 NOTE — Progress Notes (Signed)
 He presents today with his interpreter for follow-up of his fungus in his toenails on his hands.  He states that it looks like it is growing out in the hands a little bit so maybe it will be good on the feet to have not noticed a big difference in the feet.  States he is he took the first 30 tablets with no [place] side effects.  States that it might make him a little bit sleepy so he takes it at nighttime.  Objective: Vital signs are stable alert oriented x 3.  Pulses are palpable.  No change in physical exam of the feet yet that he does show me some clearing on his fingernails.  Assessment: Onychomycosis bilateral.  Plan: Dispensed prescription for blood work.  And prescribed the next 90 tablets of Lamisil.  Questions or concerns she will notify us immediately should lab work come back abnormal we will notify him.

## 2024-02-27 ENCOUNTER — Ambulatory Visit: Admitting: Podiatry
